# Patient Record
Sex: Female | Born: 1975 | Race: White | Hispanic: No | Marital: Married | Smoking: Never smoker
Health system: Southern US, Community
[De-identification: ages and names within clinical notes are randomized; demographics above are authoritative.]

## PROBLEM LIST (undated history)

## (undated) DIAGNOSIS — G4730 Sleep apnea, unspecified: Secondary | ICD-10-CM

## (undated) DIAGNOSIS — F32A Depression, unspecified: Secondary | ICD-10-CM

## (undated) DIAGNOSIS — M7989 Other specified soft tissue disorders: Secondary | ICD-10-CM

## (undated) DIAGNOSIS — F419 Anxiety disorder, unspecified: Secondary | ICD-10-CM

## (undated) DIAGNOSIS — K219 Gastro-esophageal reflux disease without esophagitis: Secondary | ICD-10-CM

## (undated) DIAGNOSIS — I10 Essential (primary) hypertension: Secondary | ICD-10-CM

## (undated) DIAGNOSIS — R011 Cardiac murmur, unspecified: Secondary | ICD-10-CM

## (undated) DIAGNOSIS — I639 Cerebral infarction, unspecified: Secondary | ICD-10-CM

## (undated) DIAGNOSIS — N200 Calculus of kidney: Secondary | ICD-10-CM

## (undated) DIAGNOSIS — E785 Hyperlipidemia, unspecified: Secondary | ICD-10-CM

## (undated) DIAGNOSIS — M549 Dorsalgia, unspecified: Secondary | ICD-10-CM

## (undated) DIAGNOSIS — J45909 Unspecified asthma, uncomplicated: Secondary | ICD-10-CM

## (undated) DIAGNOSIS — M2550 Pain in unspecified joint: Secondary | ICD-10-CM

## (undated) HISTORY — DX: Depression, unspecified: F32.A

## (undated) HISTORY — DX: Hyperlipidemia, unspecified: E78.5

## (undated) HISTORY — PX: CHOLECYSTECTOMY: SHX55

## (undated) HISTORY — DX: Anxiety disorder, unspecified: F41.9

## (undated) HISTORY — DX: Cardiac murmur, unspecified: R01.1

## (undated) HISTORY — DX: Unspecified asthma, uncomplicated: J45.909

## (undated) HISTORY — DX: Pain in unspecified joint: M25.50

## (undated) HISTORY — DX: Sleep apnea, unspecified: G47.30

## (undated) HISTORY — PX: ABDOMINAL HYSTERECTOMY: SHX81

## (undated) HISTORY — PX: TUBAL LIGATION: SHX77

## (undated) HISTORY — PX: KNEE ARTHROSCOPY: SUR90

## (undated) HISTORY — PX: OTHER SURGICAL HISTORY: SHX169

## (undated) HISTORY — DX: Other specified soft tissue disorders: M79.89

## (undated) HISTORY — DX: Gastro-esophageal reflux disease without esophagitis: K21.9

## (undated) HISTORY — DX: Cerebral infarction, unspecified: I63.9

## (undated) HISTORY — DX: Dorsalgia, unspecified: M54.9

---

## 2010-09-24 ENCOUNTER — Encounter (INDEPENDENT_AMBULATORY_CARE_PROVIDER_SITE_OTHER): Payer: Medicaid Other | Admitting: Obstetrics and Gynecology

## 2010-09-24 ENCOUNTER — Other Ambulatory Visit: Payer: Self-pay | Admitting: Obstetrics and Gynecology

## 2010-09-24 ENCOUNTER — Other Ambulatory Visit (HOSPITAL_COMMUNITY)
Admission: RE | Admit: 2010-09-24 | Discharge: 2010-09-24 | Disposition: A | Discharge status: Home / Self Care | Payer: Medicaid Other | Source: Ambulatory Visit | Attending: Obstetrics and Gynecology | Admitting: Obstetrics and Gynecology

## 2010-09-24 DIAGNOSIS — Z01419 Encounter for gynecological examination (general) (routine) without abnormal findings: Secondary | ICD-10-CM | POA: Insufficient documentation

## 2010-09-24 DIAGNOSIS — Z302 Encounter for sterilization: Secondary | ICD-10-CM

## 2010-09-24 DIAGNOSIS — R87810 Cervical high risk human papillomavirus (HPV) DNA test positive: Secondary | ICD-10-CM | POA: Insufficient documentation

## 2010-09-24 DIAGNOSIS — Z113 Encounter for screening for infections with a predominantly sexual mode of transmission: Secondary | ICD-10-CM | POA: Insufficient documentation

## 2010-09-25 NOTE — Assessment & Plan Note (Signed)
Mckenzie Park, Mckenzie Park             ACCOUNT NO.:  [id number]  MEDICAL RECORD NO.:  [id number]          PATIENT TYPE:  LOCATION:  [facility] [place]           FACILITY:  PHYSICIAN:  Catalina Antigua, MD          DATE OF BIRTH:  DATE OF SERVICE:  09/24/2010                                 CLINIC NOTE  This is a 34 year old G5, P4-0-1-4 with LMP of August 29, 2010, who presents today for annual exam as well as evaluation of a vaginal discharge.  The patient recently moved from [place] in November 2011, and reports having approximately 7 different sexual partner since that time.  The patient uses bilateral tubal ligation for contraception as well as condoms occasionally.  The patient reports that over the past 3 months, she has noted the presence of a vaginal discharge which is mainly milky gray in color and occasionally yellow tinged accompanied by a fishy odor and over the past few weeks, she started to experience some pruritus and burning sensation.  The patient is otherwise without any other complaints.  Denies abnormal bleeding or pelvic pain.  The patient is also interested in full STD testing today.  PAST MEDICAL HISTORY:  Significant for hypertension.  PAST SURGICAL HISTORY:  She has had a kidney stone removal and bilateral tubal ligation.  PAST OB HISTORY:  She has had 4 full-term vaginal deliveries and 1 miscarriage treated by D and C.  PAST GYN HISTORY:  She denies any cyst, fibroids, or history of abnormal Pap smears.  FAMILY HISTORY:  Significant for diabetes, hypertension, coronary artery disease, and a grandmother with pancreatic cancer.  SOCIAL HISTORY:  She denies drinking, smoking, or the use of illicit drugs.  REVIEW OF SYSTEMS:  Otherwise significant for occasional joint pain.  PHYSICAL EXAMINATION:  VITAL SIGNS:  Her blood pressure is 127/70, pulse of 55, weight of 200 pounds, height of 65 inches. LUNGS:  Clear to auscultation bilaterally. HEART:  Regular  rate and rhythm. BREASTS:  Nontender, equal in size.  No palpable lymphadenopathy.  No expressible nipple discharge.  No skin dimpling. ABDOMEN:  Soft, nontender, nondistended. PELVIC:  She had normal-appearing external genitalia.  Normal-appearing vaginal mucosa and cervix.  A thin milky white discharge was visualized. No odor was appreciated.  Cervix is easily friable.  Bimanual exam shows a small anteverted uterus.  No palpable adnexal masses or tenderness.  ASSESSMENT AND PLAN:  This is a 35 year old G5, P4 with LMP of August 29, 2010, presents today for annual exam and requesting sexually transmitted disease testing.  Pap smears and cultures were performed along with a wet prep.  The patient will be tested for human immunodeficiency virus, hepatitis B, C, and syphilis.  The patient will be contacted with any abnormal results.  The patient is otherwise to return in a year or p.r.n.  The patient was also advised to continue to religious use of condoms as to prevent sexually transmitted diseases transmission and the patient is to follow up with her primary care physician for the management of her hypertension.          ______________________________ Catalina Antigua, MD    PC/MEDQ  D:  09/24/2010  T:  09/25/2010  Job:  [id number] 

## 2010-11-13 ENCOUNTER — Ambulatory Visit: Payer: Medicaid Other | Admitting: Obstetrics & Gynecology

## 2010-11-20 ENCOUNTER — Ambulatory Visit (INDEPENDENT_AMBULATORY_CARE_PROVIDER_SITE_OTHER): Payer: Medicaid Other | Admitting: Obstetrics & Gynecology

## 2010-11-20 DIAGNOSIS — Z113 Encounter for screening for infections with a predominantly sexual mode of transmission: Secondary | ICD-10-CM

## 2015-07-04 ENCOUNTER — Encounter (HOSPITAL_COMMUNITY): Payer: Self-pay | Admitting: Emergency Medicine

## 2015-07-04 ENCOUNTER — Emergency Department (HOSPITAL_COMMUNITY)
Admission: EM | Admit: 2015-07-04 | Discharge: 2015-07-04 | Disposition: A | Discharge status: Home / Self Care | Payer: BLUE CROSS/BLUE SHIELD | Attending: Emergency Medicine | Admitting: Emergency Medicine

## 2015-07-04 DIAGNOSIS — Z87442 Personal history of urinary calculi: Secondary | ICD-10-CM | POA: Diagnosis not present

## 2015-07-04 DIAGNOSIS — H538 Other visual disturbances: Secondary | ICD-10-CM | POA: Diagnosis not present

## 2015-07-04 DIAGNOSIS — R112 Nausea with vomiting, unspecified: Secondary | ICD-10-CM | POA: Diagnosis not present

## 2015-07-04 DIAGNOSIS — R51 Headache: Secondary | ICD-10-CM | POA: Insufficient documentation

## 2015-07-04 DIAGNOSIS — I10 Essential (primary) hypertension: Secondary | ICD-10-CM | POA: Diagnosis not present

## 2015-07-04 DIAGNOSIS — R519 Headache, unspecified: Secondary | ICD-10-CM

## 2015-07-04 HISTORY — DX: Calculus of kidney: N20.0

## 2015-07-04 HISTORY — DX: Essential (primary) hypertension: I10

## 2015-07-04 MED ORDER — DIPHENHYDRAMINE HCL 50 MG/ML IJ SOLN
25.0000 mg | Freq: Once | INTRAMUSCULAR | Status: AC
  Administered 2015-07-04: 25 mg via INTRAVENOUS
  Filled 2015-07-04: qty 1

## 2015-07-04 MED ORDER — METHYLPREDNISOLONE SODIUM SUCC 125 MG IJ SOLR
125.0000 mg | Freq: Once | INTRAMUSCULAR | Status: AC
  Administered 2015-07-04: 125 mg via INTRAVENOUS
  Filled 2015-07-04: qty 2

## 2015-07-04 MED ORDER — PROCHLORPERAZINE EDISYLATE 5 MG/ML IJ SOLN
10.0000 mg | Freq: Once | INTRAMUSCULAR | Status: AC
  Administered 2015-07-04: 10 mg via INTRAVENOUS
  Filled 2015-07-04: qty 2

## 2015-07-04 MED ORDER — SODIUM CHLORIDE 0.9 % IV BOLUS (SEPSIS)
1000.0000 mL | Freq: Once | INTRAVENOUS | Status: AC
  Administered 2015-07-04: 1000 mL via INTRAVENOUS

## 2015-07-04 MED ORDER — KETOROLAC TROMETHAMINE 30 MG/ML IJ SOLN
30.0000 mg | Freq: Once | INTRAMUSCULAR | Status: AC
  Administered 2015-07-04: 30 mg via INTRAVENOUS
  Filled 2015-07-04: qty 1

## 2015-07-04 MED ORDER — MAGNESIUM SULFATE 2 GM/50ML IV SOLN
2.0000 g | Freq: Once | INTRAVENOUS | Status: AC
  Administered 2015-07-04: 2 g via INTRAVENOUS
  Filled 2015-07-04: qty 50

## 2015-07-04 NOTE — Discharge Instructions (Signed)
Please follow with your primary care doctor in the next 2 days for a check-up. They must obtain records for further management.   Do not hesitate to return to the Emergency Department for any new, worsening or concerning symptoms.    General Headache Without Cause A headache is pain or discomfort felt around the head or neck area. There are many causes and types of headaches. In some cases, the cause may not be found.  HOME CARE  Managing Pain  Take over-the-counter and prescription medicines only as told by your doctor.  Lie down in a dark, quiet room when you have a headache.  If directed, apply ice to the head and neck area:  Put ice in a plastic bag.  Place a towel between your skin and the bag.  Leave the ice on for 20 minutes, 2-3 times per day.  Use a heating pad or hot shower to apply heat to the head and neck area as told by your doctor.  Keep lights dim if bright lights bother you or make your headaches worse. Eating and Drinking  Eat meals on a regular schedule.  Lessen how much alcohol you drink.  Lessen how much caffeine you drink, or stop drinking caffeine. General Instructions  Keep all follow-up visits as told by your doctor. This is important.  Keep a journal to find out if certain things bring on headaches. For example, write down:  What you eat and drink.  How much sleep you get.  Any change to your diet or medicines.  Relax by getting a massage or doing other relaxing activities.  Lessen stress.  Sit up straight. Do not tighten (tense) your muscles.  Do not use tobacco products. This includes cigarettes, chewing tobacco, or e-cigarettes. If you need help quitting, ask your doctor.  Exercise regularly as told by your doctor.  Get enough sleep. This often means 7-9 hours of sleep. GET HELP IF:  Your symptoms are not helped by medicine.  You have a headache that feels different than the other headaches.  You feel sick to your stomach  (nauseous) or you throw up (vomit).  You have a fever. GET HELP RIGHT AWAY IF:   Your headache becomes really bad.  You keep throwing up.  You have a stiff neck.  You have trouble seeing.  You have trouble speaking.  You have pain in the eye or ear.  Your muscles are weak or you lose muscle control.  You lose your balance or have trouble walking.  You feel like you will pass out (faint) or you pass out.  You have confusion.   This information is not intended to replace advice given to you by your health care provider. Make sure you discuss any questions you have with your health care provider.   Document Released: 02/19/2008 Document Revised: 01/31/2015 Document Reviewed: 09/04/2014 Elsevier Interactive Patient Education [employer].

## 2015-07-04 NOTE — ED Provider Notes (Signed)
CSN: [id number]     Arrival date & time 07/04/15  0414 History   First MD Initiated Contact with Patient 07/04/15 0602     Chief Complaint  Patient presents with  . Migraine     (Consider location/radiation/quality/duration/timing/severity/associated sxs/prior Treatment) HPI   Blood pressure 128/96, pulse 67, temperature 98.5 F (36.9 C), temperature source Oral, resp. rate 18, height 5\' 5"  (1.651 m), weight 95.794 kg, SpO2 99 %.  Mckenzie Park is a 40 y.o. female complaining of migraine onset 3 days ago with bilateral frontal radiating back to the occipital area feels throbbing, 9 out of 10 associated with photophobia, phonophobia, nausea, vomiting, blurred visions is consistent with prior episodes of migraine. She recently moved to the area and does not have a neurologist. She's taken Imitrex in the past. She has a primary care gave her an unknown medication but she can only have 2 in a 24-hour period and this is not helping her. States that the frequency of migraines are increasing to approximately 15-16 per month. She denies fever, chills, cervicalgia, chest pain, shortness of breath, dysarthria, ataxia.   Past Medical History  Diagnosis Date  . Kidney stone   . Hypertension    Past Surgical History  Procedure Laterality Date  . Abdominal hysterectomy    . Cholecystectomy    . Tubal ligation     History reviewed. No pertinent family history. Social History  Substance Use Topics  . Smoking status: Never Smoker   . Smokeless tobacco: None  . Alcohol Use: No   OB History    No data available     Review of Systems  10 systems reviewed and found to be negative, except as noted in the HPI.  Allergies  Review of patient's allergies indicates no known allergies.  Home Medications   Prior to Admission medications   Not on File   BP 128/96 mmHg  Pulse 67  Temp(Src) 98.5 F (36.9 C) (Oral)  Resp 18  Ht 5\' 5"  (1.651 m)  Wt 95.794 kg  BMI 35.14 kg/m2  SpO2  99% Physical Exam  Constitutional: She is oriented to person, place, and time. She appears well-developed and well-nourished.  HENT:  Head: Normocephalic and atraumatic.  Mouth/Throat: Oropharynx is clear and moist.  Eyes: Conjunctivae and EOM are normal. Pupils are equal, round, and reactive to light.  No TTP of maxillary or frontal sinuses  No TTP or induration of temporal arteries bilaterally  Neck: Normal range of motion. Neck supple.  FROM to C-spine. Pt can touch chin to chest without discomfort. No TTP of midline cervical spine.   Cardiovascular: Normal rate, regular rhythm and intact distal pulses.   Pulmonary/Chest: Effort normal and breath sounds normal. No respiratory distress. She has no wheezes. She has no rales. She exhibits no tenderness.  Abdominal: Soft. Bowel sounds are normal. There is no tenderness.  Musculoskeletal: Normal range of motion. She exhibits no edema or tenderness.  Neurological: She is alert and oriented to person, place, and time. No cranial nerve deficit.  II-Visual fields grossly intact. III/IV/VI-Extraocular movements intact.  Pupils reactive bilaterally. V/VII-Smile symmetric, equal eyebrow raise,  facial sensation intact VIII- Hearing grossly intact IX/X-Normal gag XI-bilateral shoulder shrug XII-midline tongue extension Motor: 5/5 bilaterally with normal tone and bulk Cerebellar: Normal finger-to-nose  and normal heel-to-shin test.   Romberg negative Ambulates with a coordinated gait   Nursing note and vitals reviewed.   ED Course  Procedures (including critical care time) Labs Review Labs Reviewed - No  data to display  Imaging Review No results found. I have personally reviewed and evaluated these images and lab results as part of my medical decision-making.   EKG Interpretation None      MDM   Final diagnoses:  Nonintractable headache, unspecified chronicity pattern, unspecified headache type    Filed Vitals:   07/04/15  0418  BP: 128/96  Pulse: 67  Temp: 98.5 F (36.9 C)  TempSrc: Oral  Resp: 18  Height: 5\' 5"  (1.651 m)  Weight: 95.794 kg  SpO2: 99%    Medications  sodium chloride 0.9 % bolus 1,000 mL (not administered)  prochlorperazine (COMPAZINE) injection 10 mg (not administered)  magnesium sulfate IVPB 2 g 50 mL (not administered)  methylPREDNISolone sodium succinate (SOLU-MEDROL) 125 mg/2 mL injection 125 mg (not administered)  ketorolac (TORADOL) 30 MG/ML injection 30 mg (not administered)  diphenhydrAMINE (BENADRYL) injection 25 mg (not administered)    Mckenzie Park is 40 y.o. female presenting with headache typical for her migraine exacerbations. Patient recently moved to the area, she does have a primary care but has not establish neurologic care. Neuro exam nonfocal. Headache resolved with cocktail. HA. Presentation is like pts typical HA and non concerning for [facility], ICH, Meningitis, or temporal arteritis. Pt is afebrile with no focal neuro deficits, nuchal rigidity, or change in vision. Pt is to follow up with PCP to discuss prophylactic medication. Pt verbalizes understanding and is agreeable with plan to dc.  Evaluation does not show pathology that would require ongoing emergent intervention or inpatient treatment. Pt is hemodynamically stable and mentating appropriately. Discussed findings and plan with patient/guardian, who agrees with care plan. All questions answered. Return precautions discussed and outpatient follow up given.    Monico Blitz, PA-C 07/04/15 0805  Quintella Reichert, MD 07/05/15 [phone]

## 2015-07-04 NOTE — ED Notes (Signed)
Patient here with history of migraine for 3 days.  Patient has taken her meds for her migraine without relief.  Patient does have nausea and vomiting, photophobia and noise sensitivity.

## 2015-08-01 ENCOUNTER — Encounter: Payer: Self-pay | Admitting: Neurology

## 2015-08-01 ENCOUNTER — Ambulatory Visit (INDEPENDENT_AMBULATORY_CARE_PROVIDER_SITE_OTHER): Payer: BLUE CROSS/BLUE SHIELD | Admitting: Neurology

## 2015-08-01 VITALS — BP 148/80 | HR 54 | Ht 65.5 in | Wt 211.0 lb

## 2015-08-01 DIAGNOSIS — F329 Major depressive disorder, single episode, unspecified: Secondary | ICD-10-CM | POA: Diagnosis not present

## 2015-08-01 DIAGNOSIS — R413 Other amnesia: Secondary | ICD-10-CM | POA: Insufficient documentation

## 2015-08-01 DIAGNOSIS — F32A Depression, unspecified: Secondary | ICD-10-CM

## 2015-08-01 DIAGNOSIS — G43709 Chronic migraine without aura, not intractable, without status migrainosus: Secondary | ICD-10-CM | POA: Insufficient documentation

## 2015-08-01 DIAGNOSIS — I10 Essential (primary) hypertension: Secondary | ICD-10-CM

## 2015-08-01 MED ORDER — NORTRIPTYLINE HCL 25 MG PO CAPS: 25.0000 mg | ORAL_CAPSULE | Freq: Every day | ORAL | Status: DC

## 2015-08-01 MED ORDER — SUMATRIPTAN SUCCINATE 100 MG PO TABS: ORAL_TABLET | ORAL | Status: DC

## 2015-08-01 MED ORDER — NAPROXEN 500 MG PO TABS
500.0000 mg | ORAL_TABLET | Freq: Two times a day (BID) | ORAL | Status: DC | PRN
Start: 2015-08-01 — End: 2016-03-28

## 2015-08-01 NOTE — Progress Notes (Signed)
NEUROLOGY CONSULTATION NOTE  Quanetta Schlau MRN: [id number] DOB: Jan 26, 1976  Referring provider: Monico Blitz, PA-C (ED referral) Primary care provider: no PCP  Reason for consult:  migraine  HISTORY OF PRESENT ILLNESS: Mckenzie Park is a 40 year old right-handed female with hypertension and past history of kidney stones who presents for migraines.  History obtained by patient and ED note.    Onset:  Since her 66s Location:  Bifrontal or band-like Quality:  Vice-like/squeezing, pounding in back of head Intensity:  10/10 Aura:  no Prodrome:  no Associated symptoms:  Nausea, vertigo, photophobia, blurred vision, sometimes vomiting or phonophobia Duration:  All day or unless she goes to sleep Frequency:  Over 15 days per month Triggers/exacerbating factors:  none Relieving factors:  sleep Activity:  Able to force self to function  Past NSAIDS:  none Past analgesics:  Excedrin Migraine Past abortive triptans:  Sumatriptan tablet (effective) Past muscle relaxants:  Flexeril (for knee) Past anti-nausea:  no Past antihypertensive medications:  no Past antidepressant medications:  no Past anticonvulsant medications:  no Past vitamins/Herbal/Supplements:  no Past antihistamines/decongestants:  no Other past medications:  no  Current NSAIDS:  Advil (takes at work) Current analgesics:  Tylenol (takes at work) Current triptans:  Maxalt 10mg  (variable efficacy) Current anti-nausea:  Zofran ODT 4mg  Current muscle relaxants:  no Current Antihypertensive medications:  Hyzaar Current Antidepressant medications:  "low-dose" Cymbalta (does not take daily) Current Anticonvulsant medications:  no Current Vitamins/Herbal/Supplements:  no Current Antihistamines/Decongestants:  no Other therapy:  no  Caffeine:  Coffee every other day Alcohol:  no Smoker:  no Diet:  Recently started hydrating.  Stopped sugar Exercise:  no Depression/stress:  yes Sleep hygiene:  poor Family  history of headache:  No.  Father has Parkinson's disease  She has history of fluctuating bradycardia.  She has history of kidney stones 3 times (15-20 years ago)  PAST MEDICAL HISTORY: Past Medical History  Diagnosis Date  . Kidney stone   . Hypertension     PAST SURGICAL HISTORY: Past Surgical History  Procedure Laterality Date  . Abdominal hysterectomy    . Cholecystectomy    . Tubal ligation      MEDICATIONS: No current outpatient prescriptions on file prior to visit.   No current facility-administered medications on file prior to visit.    ALLERGIES: No Known Allergies  FAMILY HISTORY: Family History  Problem Relation Age of Onset  . Parkinsonism Father     SOCIAL HISTORY: Social History   Social History  . Marital Status: Widowed    Spouse Name: N/A  . Number of Children: N/A  . Years of Education: N/A   Occupational History  . Not on file.   Social History Main Topics  . Smoking status: Never Smoker   . Smokeless tobacco: Not on file  . Alcohol Use: No  . Drug Use: No  . Sexual Activity: Not on file   Other Topics Concern  . Not on file   Social History Narrative    REVIEW OF SYSTEMS: Constitutional: No fevers, chills, or sweats, no generalized fatigue, change in appetite Eyes: No visual changes, double vision, eye pain Ear, nose and throat: No hearing loss, ear pain, nasal congestion, sore throat Cardiovascular: No chest pain, palpitations Respiratory:  No shortness of breath at rest or with exertion, wheezes GastrointestinaI: No nausea, vomiting, diarrhea, abdominal pain, fecal incontinence Genitourinary:  No dysuria, urinary retention or frequency Musculoskeletal:  No neck pain, back pain Integumentary: No rash, pruritus, skin lesions Neurological:  as above Psychiatric: No depression, insomnia, anxiety Endocrine: No palpitations, fatigue, diaphoresis, mood swings, change in appetite, change in weight, increased  thirst Hematologic/Lymphatic:  No anemia, purpura, petechiae. Allergic/Immunologic: no itchy/runny eyes, nasal congestion, recent allergic reactions, rashes  PHYSICAL EXAM: Filed Vitals:   08/01/15 0850  BP: 148/80  Pulse: 54   General: No acute distress.  Patient appears well-groomed.  Head:  Normocephalic/atraumatic Eyes:  fundi unremarkable, without vessel changes, exudates, hemorrhages or papilledema. Neck: supple, no paraspinal tenderness, full range of motion Back: No paraspinal tenderness Heart: regular rate and rhythm Lungs: Clear to auscultation bilaterally. Vascular: No carotid bruits. Neurological Exam: Mental status: alert and oriented to person, place, and time, recent and remote memory intact, fund of knowledge intact, attention and concentration intact, speech fluent and not dysarthric, language intact. Cranial nerves: CN I: not tested CN II: pupils equal, round and reactive to light, visual fields intact, fundi unremarkable, without vessel changes, exudates, hemorrhages or papilledema. CN III, IV, VI:  full range of motion, no nystagmus, no ptosis CN V: facial sensation intact CN VII: upper and lower face symmetric CN VIII: hearing intact CN IX, X: gag intact, uvula midline CN XI: sternocleidomastoid and trapezius muscles intact CN XII: tongue midline Bulk & Tone: normal, no fasciculations. Motor:  5/5 throughout  Sensation:  Pinprick and vibration sensation intact. Deep Tendon Reflexes:  2+ throughout, toes downgoing.  Finger to nose testing:  Without dysmetria.  Heel to shin:  Without dysmetria.  Gait:  Normal station and stride.  Able to turn and tandem walk. Romberg negative.  IMPRESSION: Chronic migraine without aura Depression HTN  I would steer away from beta blockers (history of episodic bradycardia) and topamax (kidney stones)  PLAN: 1.  Stop Cymbalta.  Start nortriptyline 25mg  at bedtime 2.  Stop Maxalt.  Retry sumatriptan with/without naproxen  500mg   3.  Lifestyle modification (exercise, diet, sleep hygiene, depression treatment) 4.  Follow up with new PCP or urgent care regarding BP 5.  Contact us with update in 4 weeks.  Follow up in 3 to 4 months.  45 minutes spent face to face with patient, over 50% spent discussing management.  Thank you for allowing me to take part in the care of this patient.  Metta Clines, DO

## 2015-08-01 NOTE — Patient Instructions (Addendum)
Migraine Recommendations: 1.  Stop Cymbalta.  Start nortriptyline 50mg  at bedtime.  Call in 4 weeks with update and we can adjust dose if needed. 2.  Stop rizatriptan.  Take sumatriptan 100mg  at earliest onset of headache.  May repeat dose once in 2 hours if needed.  Do not exceed two tablets in 24 hours.  May take each dose with naproxen 500mg .  Take Zofran for nausea 3.  Limit use of pain relievers to no more than 2 days out of the week.  These medications include acetaminophen, ibuprofen, triptans and narcotics.  This will help reduce risk of rebound headaches. 4.  Be aware of common food triggers such as processed sweets, processed foods with nitrites (such as deli meat, hot dogs, sausages), foods with MSG, alcohol (such as wine), chocolate, certain cheeses, certain fruits (dried fruits, some citrus fruit), vinegar, diet soda. 4.  Avoid caffeine 5.  Routine exercise 6.  Proper sleep hygiene 7.  Stay adequately hydrated with water 8.  Keep a headache diary. 9.  Maintain proper stress management. 10.  Do not skip meals. 11.  Consider supplements:  Magnesium oxide 400mg  to 600mg  daily, riboflavin 400mg , Coenzyme Q 10 100mg  three times daily 12.  Follow up

## 2015-08-31 ENCOUNTER — Telehealth: Payer: Self-pay

## 2015-08-31 DIAGNOSIS — I10 Essential (primary) hypertension: Secondary | ICD-10-CM

## 2015-08-31 DIAGNOSIS — G43709 Chronic migraine without aura, not intractable, without status migrainosus: Secondary | ICD-10-CM

## 2015-08-31 DIAGNOSIS — F32A Depression, unspecified: Secondary | ICD-10-CM

## 2015-08-31 DIAGNOSIS — F329 Major depressive disorder, single episode, unspecified: Secondary | ICD-10-CM

## 2015-08-31 MED ORDER — SUMATRIPTAN SUCCINATE 100 MG PO TABS: ORAL_TABLET | ORAL | Status: DC

## 2015-08-31 MED ORDER — NORTRIPTYLINE HCL 50 MG PO CAPS: 50.0000 mg | ORAL_CAPSULE | Freq: Every day | ORAL | Status: DC

## 2015-08-31 NOTE — Telephone Encounter (Signed)
Pt called with 4 week update. Pt has had 7 migraines this month. Last month she had ~10.  She is not having side effects from medication. Please advise.   [phone]

## 2015-08-31 NOTE — Telephone Encounter (Signed)
I would like to increase nortriptyline from 25mg  to 50mg  at bedtime and she should contact us again with update in 4 weeks.

## 2015-08-31 NOTE — Telephone Encounter (Signed)
Message relayed to patient. Verbalized understanding and denied questions.   

## 2015-09-26 ENCOUNTER — Other Ambulatory Visit: Payer: Self-pay

## 2015-09-26 DIAGNOSIS — Z1231 Encounter for screening mammogram for malignant neoplasm of breast: Secondary | ICD-10-CM

## 2015-10-10 ENCOUNTER — Ambulatory Visit
Admission: RE | Admit: 2015-10-10 | Discharge: 2015-10-10 | Disposition: A | Discharge status: Home / Self Care | Payer: BLUE CROSS/BLUE SHIELD | Source: Ambulatory Visit

## 2015-10-10 DIAGNOSIS — Z1231 Encounter for screening mammogram for malignant neoplasm of breast: Secondary | ICD-10-CM

## 2015-11-12 ENCOUNTER — Other Ambulatory Visit: Payer: Self-pay | Admitting: Sports Medicine

## 2015-11-12 DIAGNOSIS — M25531 Pain in right wrist: Secondary | ICD-10-CM

## 2015-11-15 ENCOUNTER — Encounter: Payer: Self-pay | Admitting: Neurology

## 2015-11-15 ENCOUNTER — Ambulatory Visit (INDEPENDENT_AMBULATORY_CARE_PROVIDER_SITE_OTHER): Payer: BLUE CROSS/BLUE SHIELD | Admitting: Neurology

## 2015-11-15 VITALS — BP 130/82 | HR 61 | Ht 65.5 in | Wt 224.0 lb

## 2015-11-15 DIAGNOSIS — R030 Elevated blood-pressure reading, without diagnosis of hypertension: Secondary | ICD-10-CM

## 2015-11-15 DIAGNOSIS — Z658 Other specified problems related to psychosocial circumstances: Secondary | ICD-10-CM | POA: Diagnosis not present

## 2015-11-15 DIAGNOSIS — F439 Reaction to severe stress, unspecified: Secondary | ICD-10-CM

## 2015-11-15 DIAGNOSIS — G43709 Chronic migraine without aura, not intractable, without status migrainosus: Secondary | ICD-10-CM | POA: Diagnosis not present

## 2015-11-15 DIAGNOSIS — IMO0001 Reserved for inherently not codable concepts without codable children: Secondary | ICD-10-CM

## 2015-11-15 MED ORDER — NORTRIPTYLINE HCL 25 MG PO CAPS: 75.0000 mg | ORAL_CAPSULE | Freq: Every day | ORAL | Status: DC

## 2015-11-15 MED ORDER — ZOLMITRIPTAN 5 MG PO TABS: ORAL_TABLET | ORAL | Status: DC

## 2015-11-15 NOTE — Progress Notes (Signed)
NEUROLOGY FOLLOW UP OFFICE NOTE  Lynna Carlsson [id number]  HISTORY OF PRESENT ILLNESS: Veera Dockendorf is a 40 year old right-handed female with hypertension and past history of kidney stones who follows up for migraines.  UPDATE: Migraine frequency has reduced frequency, but she gets a dull head pressure every other day.  When she is at work, she takes an ibuprofen or Tylenol, because sumatriptan makes her too drowsy to function.  This is ineffective, so she has a headache until she can go home and sleep.  Intensity:  10/10 Duration:  She goes to sleep after taking sumatriptan because it makes her drowsy Frequency:  2 to 3 days per month, but has dull headache every other day. Current NSAIDS:  naproxen 500mg , Advil (takes at work) Current analgesics:  Tylenol (takes at work) Current triptans:  sumatrptan 100mg  Current anti-nausea:  Zofran ODT 4mg  Current muscle relaxants:  no Current Antihypertensive medications:  Hyzaar Current Antidepressant medications:  nortriptyline 50mg  Current Anticonvulsant medications:  no Current Vitamins/Herbal/Supplements:  no Current Antihistamines/Decongestants:  no Other therapy:  no  Caffeine:  1 cup every other day Alcohol:  no Smoker:  no Diet:  Hydrates.  Stopped sugar and salt Exercise:  Not until she is cleared by workman's comp for her hand injury Depression/stress:  Yes.  She is caring for two sick parents.  She is working 2 jobs. Sleep hygiene:  Poor, although a little better since moving from 3rd shift to 2nd shift.  HISTORY: Onset:  Since her 26s Location:  Bifrontal or band-like Quality:  Vice-like/squeezing, pounding in back of head Initial Intensity:  10/10 Aura:  no Prodrome:  no Associated symptoms:  Nausea, vertigo, photophobia, blurred vision, sometimes vomiting or phonophobia Initial Duration:  All day or unless she goes to sleep Initial Frequency:  Over 15 days per month Triggers/exacerbating factors:   none Relieving factors:  sleep Activity:  Able to force self to function  Past NSAIDS:  naproxen 500mg  (ineffective) Past analgesics:  Excedrin Migraine Past abortive triptans:  Maxalt  Past muscle relaxants:  Flexeril (for knee) Past anti-nausea:  no Past antihypertensive medications:  no Past antidepressant medications:  Cymbalta Past anticonvulsant medications:  no Past vitamins/Herbal/Supplements:  no Past antihistamines/decongestants:  no Other past medications:  no  Family history of headache:  No.  Father has Parkinson's disease  She has history of fluctuating bradycardia.  She has history of kidney stones 3 times (15-20 years ago)  PAST MEDICAL HISTORY: Past Medical History  Diagnosis Date  . Kidney stone   . Hypertension     MEDICATIONS: Current Outpatient Prescriptions on File Prior to Visit  Medication Sig Dispense Refill  . losartan-hydrochlorothiazide (HYZAAR) 50-12.5 MG tablet Take 1 tablet by mouth daily.    . SUMAtriptan (IMITREX) 100 MG tablet Take 1 tab at earliest onset of headache.  May repeat x1 in 2 hours if headache persists or recurs.  Do not exceed 2 tablets in 24 hours 10 tablet 2  . naproxen (NAPROSYN) 500 MG tablet Take 1 tablet (500 mg total) by mouth every 12 (twelve) hours as needed. (Patient not taking: Reported on 11/15/2015) 16 tablet 2   No current facility-administered medications on file prior to visit.    ALLERGIES: No Known Allergies  FAMILY HISTORY: Family History  Problem Relation Age of Onset  . Parkinsonism Father     SOCIAL HISTORY: Social History   Social History  . Marital Status: Widowed    Spouse Name: N/A  . Number of Children: N/A  .  Years of Education: N/A   Occupational History  . Not on file.   Social History Main Topics  . Smoking status: Never Smoker   . Smokeless tobacco: Not on file  . Alcohol Use: No  . Drug Use: No  . Sexual Activity: Not on file   Other Topics Concern  . Not on file   Social  History Narrative    REVIEW OF SYSTEMS: Constitutional: No fevers, chills, or sweats, no generalized fatigue, change in appetite Eyes: No visual changes, double vision, eye pain Ear, nose and throat: No hearing loss, ear pain, nasal congestion, sore throat Cardiovascular: No chest pain, palpitations Respiratory:  No shortness of breath at rest or with exertion, wheezes GastrointestinaI: No nausea, vomiting, diarrhea, abdominal pain, fecal incontinence Genitourinary:  No dysuria, urinary retention or frequency Musculoskeletal:  No neck pain, back pain Integumentary: No rash, pruritus, skin lesions Neurological: as above Psychiatric: No depression, insomnia, anxiety Endocrine: No palpitations, fatigue, diaphoresis, mood swings, change in appetite, change in weight, increased thirst Hematologic/Lymphatic:  No purpura, petechiae. Allergic/Immunologic: no itchy/runny eyes, nasal congestion, recent allergic reactions, rashes  PHYSICAL EXAM: Filed Vitals:   11/15/15 0733  BP: 130/82  Pulse: 61   General: No acute distress.  Patient appears well-groomed.  Head:  Normocephalic/atraumatic Eyes:  Fundi examined but not visualized Neck: supple, no paraspinal tenderness, full range of motion Heart:  Regular rate and rhythm Lungs:  Clear to auscultation bilaterally Back: No paraspinal tenderness Neurological Exam: alert and oriented to person, place, and time. Attention span and concentration intact, recent and remote memory intact, fund of knowledge intact.  Speech fluent and not dysarthric, language intact.  CN II-XII intact. Bulk and tone normal, unable to test strength in right hand due to injury.  Otherwise muscle strength 5/5 throughout.  Sensation to light touch, temperature and vibration intact.  Deep tendon reflexes 2+ throughout.  Finger to nose and heel to shin testing intact.  Gait normal, Romberg negative.  IMPRESSION: Migraine without aura Stress  PLAN: 1.  We will switch  abortive medication to Zomig 5mg  to see if it is still effective but does not cause the drowsiness.  Ideally, I would want her to take the triptan at earliest onset of headache.  If ineffective, then I would ask her to take 1/2 of the sumatriptan to see if it is still effective but not cause drowsiness. 2.  She may have underlying tension type headaches.  I would like to increase nortriptyline to 75mg  at bedtime to see how she does. 3.  Refer to Behavioral Health to address stress, which is affecting her sleep and contributing to headaches. 4.  Blood pressure elevated today.  Should follow up with PCP at [facility]. 5.  Follow up in 3 to 4 months.  15 minutes spent face to face with patient, over 50% spent counseling.  Metta Clines, DO

## 2015-11-15 NOTE — Patient Instructions (Addendum)
1.  We will increase nortriptyline to 75mg  at bedtime 2.  Instead of sumatriptan, try taking zolmitriptan 5mg  at earliest onset of headache.  You may repeat dose once in 2 hours if needed (not exceeding 2 tablets in 24 hours).  If this does not appear effective and/or still causes drowsiness, then next time you have a migraine, take 1/2 of a sumatriptan instead and hopefully it will be effective and not cause drowsiness.  You may repeat dose every two hours for total of 4 doses. 3.  We will refer you to Behavioral Medicine to help address stress, which is a trigger to headaches. 4.  Follow up in 3-4 months.

## 2015-11-17 ENCOUNTER — Ambulatory Visit
Admission: RE | Admit: 2015-11-17 | Discharge: 2015-11-17 | Disposition: A | Discharge status: Home / Self Care | Payer: Worker's Compensation | Source: Ambulatory Visit | Attending: Sports Medicine | Admitting: Sports Medicine

## 2015-11-17 DIAGNOSIS — M25531 Pain in right wrist: Secondary | ICD-10-CM

## 2016-02-15 ENCOUNTER — Ambulatory Visit: Payer: BLUE CROSS/BLUE SHIELD | Admitting: Neurology

## 2016-03-28 ENCOUNTER — Ambulatory Visit (INDEPENDENT_AMBULATORY_CARE_PROVIDER_SITE_OTHER): Payer: BLUE CROSS/BLUE SHIELD | Admitting: Neurology

## 2016-03-28 ENCOUNTER — Encounter: Payer: Self-pay | Admitting: Neurology

## 2016-03-28 VITALS — BP 110/66 | HR 68 | Ht 65.5 in | Wt 228.0 lb

## 2016-03-28 DIAGNOSIS — G43709 Chronic migraine without aura, not intractable, without status migrainosus: Secondary | ICD-10-CM

## 2016-03-28 DIAGNOSIS — I10 Essential (primary) hypertension: Secondary | ICD-10-CM | POA: Diagnosis not present

## 2016-03-28 MED ORDER — SUMATRIPTAN SUCCINATE 100 MG PO TABS: ORAL_TABLET | ORAL | 4 refills | Status: DC

## 2016-03-28 MED ORDER — NORTRIPTYLINE HCL 50 MG PO CAPS: 100.0000 mg | ORAL_CAPSULE | Freq: Every day | ORAL | 4 refills | Status: DC

## 2016-03-28 MED ORDER — ONDANSETRON 4 MG PO TBDP: 4.0000 mg | ORAL_TABLET | Freq: Three times a day (TID) | ORAL | 4 refills | Status: DC | PRN

## 2016-03-28 NOTE — Patient Instructions (Signed)
1.  We will increase nortriptyline to 100mg  at bedtime 2.  Continue sumatriptan as needed 3.  Take Zofran ODT 4mg  for nausea 4.  Follow up in 5 months.

## 2016-03-28 NOTE — Progress Notes (Signed)
NEUROLOGY FOLLOW UP OFFICE NOTE  Laural Gearty [id number]  HISTORY OF PRESENT ILLNESS: Mckenzie Park is a 40 year old right-handed female with hypertension and past history of kidney stones who follows up for migraines.   UPDATE: Improved.  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  Intensity:  10/10 Duration:  She goes to sleep after taking sumatriptan because it makes her drowsy Frequency:  Last month 6-7 migraine days.  Total of 20 headache days in a month counting dull headache. Current NSAIDS:  no.  Current analgesics:  Tylenol (takes at work). Takes ASA 81mg  Current triptans:  Sumatriptan 50mg  (never tried Zomig 5mg  NS) Current anti-nausea:  Zofran ODT 4mg  Current muscle relaxants:  no Current Antihypertensive medications:  Hyzaar Current Antidepressant medications:  nortriptyline 75mg  Current Anticonvulsant medications:  no Current Vitamins/Herbal/Supplements:  no Current Antihistamines/Decongestants:  no Other therapy:  no   Caffeine:  1 cup every other day Alcohol:  no Smoker:  no Diet:  Hydrates.  Stopped sugar and salt Exercise:  Not until she is cleared by workman's comp for her hand injury Depression/stress:  Yes.  She is caring for two sick parents.  She is working 2 jobs. Sleep hygiene:  Some improvement.  Switched to first shift.   HISTORY: Onset:  Since her 60s Location:  Bifrontal or band-like Quality:  Vice-like/squeezing, pounding in back of head Initial Intensity:  10/10 Aura:  no Prodrome:  no Associated symptoms:  Nausea, vertigo, photophobia, blurred vision, sometimes vomiting or phonophobia Initial Duration:  All day or unless she goes to sleep Initial Frequency:  Over 15 days per month Triggers/exacerbating factors:  none Relieving factors:  sleep Activity:  Able to force self to function   Past NSAIDS:  naproxen 500mg  (ineffective) Past analgesics:  Excedrin Migraine Past abortive  triptans:  Maxalt, sumatriptan 100mg  Past muscle relaxants:  Flexeril (for knee) Past anti-nausea:  no Past antihypertensive medications:  no Past antidepressant medications:  Cymbalta Past anticonvulsant medications:  no Past vitamins/Herbal/Supplements:  no Past antihistamines/decongestants:  no Other past medications:  no   Family history of headache:  No.  Father has Parkinson's disease   She has history of fluctuating bradycardia.  She has history of kidney stones 3 times (15-20 years ago)  PAST MEDICAL HISTORY: Past Medical History:  Diagnosis Date  . Hypertension   . Kidney stone     MEDICATIONS: Current Outpatient Prescriptions on File Prior to Visit  Medication Sig Dispense Refill  . aspirin (ASPIRIN EC) 81 MG EC tablet Take 81 mg by mouth daily. Swallow whole.    . losartan-hydrochlorothiazide (HYZAAR) 50-12.5 MG tablet Take 1 tablet by mouth daily.     No current facility-administered medications on file prior to visit.     ALLERGIES: No Known Allergies  FAMILY HISTORY: Family History  Problem Relation Age of Onset  . Parkinsonism Father     SOCIAL HISTORY: Social History   Social History  . Marital status: Widowed    Spouse name: N/A  . Number of children: N/A  . Years of education: N/A   Occupational History  . Not on file.   Social History Main Topics  . Smoking status: Never Smoker  . Smokeless tobacco: Not on file  . Alcohol use No  . Drug use: No  . Sexual activity: Not on file   Other Topics Concern  . Not on file   Social History Narrative  . No narrative on file  REVIEW OF SYSTEMS: Constitutional: No fevers, chills, or sweats, no generalized fatigue, change in appetite Eyes: No visual changes, double vision, eye pain Ear, nose and throat: No hearing loss, ear pain, nasal congestion, sore throat Cardiovascular: No chest pain, palpitations Respiratory:  No shortness of breath at rest or with exertion, wheezes GastrointestinaI:  No nausea, vomiting, diarrhea, abdominal pain, fecal incontinence Genitourinary:  No dysuria, urinary retention or frequency Musculoskeletal:  No neck pain, back pain Integumentary: No rash, pruritus, skin lesions Neurological: as above Psychiatric: No depression, insomnia, anxiety Endocrine: No palpitations, fatigue, diaphoresis, mood swings, change in appetite, change in weight, increased thirst Hematologic/Lymphatic:  No purpura, petechiae. Allergic/Immunologic: no itchy/runny eyes, nasal congestion, recent allergic reactions, rashes  PHYSICAL EXAM: Vitals:   03/28/16 0721  BP: 110/66  Pulse: 68   General: No acute distress.  Patient appears well-groomed.  Head:  Normocephalic/atraumatic Eyes:  Fundi examined but not visualized Neck: supple, no paraspinal tenderness, full range of motion Heart:  Regular rate and rhythm Lungs:  Clear to auscultation bilaterally Back: No paraspinal tenderness Neurological Exam: alert and oriented to person, place, and time. Attention span and concentration intact, recent and remote memory intact, fund of knowledge intact.  Speech fluent and not dysarthric, language intact.  CN II-XII intact. Bulk and tone normal, muscle strength 5/5 throughout.  Sensation to light touch  intact.  Deep tendon reflexes 2+ throughout.  Finger to nose testing intact.  Gait normal  IMPRESSION: Migraine  PLAN: 1.  Will try increasing nortriptyline to 100mg  at bedtime in order to futher reduce frequency of headaches 2.  Sumatriptan 50mg  and Zofran ODT 4mg  for acute migraine and nausea 3.  Follow up in 5 months.  15 minutes spent face to face with patient, 50% spent counseling.  Metta Clines, DO  CC:  [facility]

## 2016-03-28 NOTE — Progress Notes (Signed)
Chart forwarded.  

## 2016-09-02 ENCOUNTER — Encounter: Payer: Self-pay | Admitting: Neurology

## 2016-09-02 ENCOUNTER — Ambulatory Visit (INDEPENDENT_AMBULATORY_CARE_PROVIDER_SITE_OTHER): Payer: BLUE CROSS/BLUE SHIELD | Admitting: Neurology

## 2016-09-02 VITALS — BP 136/74 | HR 56 | Ht 65.5 in | Wt 226.1 lb

## 2016-09-02 DIAGNOSIS — G43709 Chronic migraine without aura, not intractable, without status migrainosus: Secondary | ICD-10-CM

## 2016-09-02 DIAGNOSIS — Z79899 Other long term (current) drug therapy: Secondary | ICD-10-CM

## 2016-09-02 MED ORDER — TOPIRAMATE 50 MG PO TABS: 50.0000 mg | ORAL_TABLET | Freq: Every day | ORAL | 2 refills | Status: DC

## 2016-09-02 MED ORDER — KETOROLAC TROMETHAMINE 60 MG/2ML IM SOLN
60.0000 mg | Freq: Once | INTRAMUSCULAR | Status: AC
  Administered 2016-09-02: 60 mg via INTRAMUSCULAR

## 2016-09-02 MED ORDER — ELETRIPTAN HYDROBROMIDE 20 MG PO TABS: ORAL_TABLET | ORAL | 0 refills | Status: DC

## 2016-09-02 NOTE — Patient Instructions (Signed)
1.  Start topiramate 50mg  at bedtime.  Make sure to drink plenty of water during the day as there is a rare side effect of causing kidney stones.  Contact me in 4 weeks with update and we can increase dose if needed. 2.  At work, try Relpax (eletriptan) 20mg  earliest onset of headache.  May repeat once in 2 hours if needed.  Do not exceed 2 tablets in 24 hours.  Use Zofran for nausea. 3.  Will give you Toradol 60mg  injection today. 4.  Follow up in 3 months.

## 2016-09-02 NOTE — Progress Notes (Signed)
NEUROLOGY FOLLOW UP OFFICE NOTE  Mckenzie Park [id number]  HISTORY OF PRESENT ILLNESS: Mckenzie Park is a 41 year old right-handed female with hypertension and past history of kidney stones who follows up for migraines.   UPDATE: She has a migraine which lasted all day at work.  She cannot take sumatriptan due to drowsiness.  She would like an alternative. Intensity:  10/10 Duration:  She goes to sleep after taking sumatriptan because it makes her drowsy Frequency:  11 days over past month Current NSAIDS:  no.  Current analgesics:  Tylenol or BC (takes at work). Takes ASA 81mg  Current triptans:  Sumatriptan 50mg  (effective but causes too much drowsiness). Current anti-nausea:  Zofran ODT 4mg  Current muscle relaxants:  no Current Antihypertensive medications:  Hyzaar Current Antidepressant medications:  nortriptyline 100mg  Current Anticonvulsant medications:  no Current Vitamins/Herbal/Supplements:  no Current Antihistamines/Decongestants:  no Other therapy:  no   Caffeine:  1 cup every other day Alcohol:  no Smoker:  no Diet:  Hydrates.  Stopped sugar and salt Exercise:  Not until she is cleared by workman's comp for her hand injury Depression/stress:  Yes.  She is caring for two sick parents.  She is working 2 jobs. Sleep hygiene:  Some improvement.  Switched to first shift.   HISTORY: Onset:  Since her 24s Location:  Bifrontal or band-like Quality:  Vice-like/squeezing, pounding in back of head Initial Intensity:  10/10; November: 10/10 Aura:  no Prodrome:  no Associated symptoms:  Nausea, vertigo, photophobia, blurred vision, sometimes vomiting or phonophobia Initial Duration:  All day or unless she goes to sleep; November: goes right to sleep Initial Frequency:  Over 15 days per month; November: 20 headache days (6-7 days are migraine) Triggers/exacerbating factors:  none Relieving factors:  sleep Activity:  Able to force self to function   Past NSAIDS:   naproxen 500mg  (ineffective) Past analgesics:  Excedrin Migraine Past abortive triptans:  Maxalt, sumatriptan 100mg  Past muscle relaxants:  Flexeril (for knee) Past anti-nausea:  no Past antihypertensive medications:  no Past antidepressant medications:  Cymbalta Past anticonvulsant medications:  no Past vitamins/Herbal/Supplements:  no Past antihistamines/decongestants:  no Other past medications:  no   Family history of headache:  No.  Father has Parkinson's disease   She has history of fluctuating bradycardia.  She has history of kidney stones 3 times (15-20 years ago)  PAST MEDICAL HISTORY: Past Medical History:  Diagnosis Date  . Hypertension   . Kidney stone     MEDICATIONS: Current Outpatient Prescriptions on File Prior to Visit  Medication Sig Dispense Refill  . aspirin (ASPIRIN EC) 81 MG EC tablet Take 81 mg by mouth daily. Swallow whole.    . losartan-hydrochlorothiazide (HYZAAR) 50-12.5 MG tablet Take 1 tablet by mouth daily.    . nortriptyline (PAMELOR) 50 MG capsule Take 2 capsules (100 mg total) by mouth at bedtime. 60 capsule 4  . ondansetron (ZOFRAN-ODT) 4 MG disintegrating tablet Take 1 tablet (4 mg total) by mouth every 8 (eight) hours as needed for nausea or vomiting. Reported on 11/15/2015 20 tablet 4  . SUMAtriptan (IMITREX) 100 MG tablet Take 1 tab at earliest onset of headache.  May repeat x1 in 2 hours if headache persists or recurs.  Do not exceed 2 tablets in 24 hours 10 tablet 4   No current facility-administered medications on file prior to visit.     ALLERGIES: No Known Allergies  FAMILY HISTORY: Family History  Problem Relation Age of Onset  . Parkinsonism Father  SOCIAL HISTORY: Social History   Social History  . Marital status: Widowed    Spouse name: N/A  . Number of children: N/A  . Years of education: N/A   Occupational History  . Not on file.   Social History Main Topics  . Smoking status: Never Smoker  . Smokeless  tobacco: Never Used  . Alcohol use No  . Drug use: No  . Sexual activity: Not on file   Other Topics Concern  . Not on file   Social History Narrative  . No narrative on file    REVIEW OF SYSTEMS: Constitutional: No fevers, chills, or sweats, no generalized fatigue, change in appetite Eyes: No visual changes, double vision, eye pain Ear, nose and throat: No hearing loss, ear pain, nasal congestion, sore throat Cardiovascular: No chest pain, palpitations Respiratory:  No shortness of breath at rest or with exertion, wheezes GastrointestinaI: No nausea, vomiting, diarrhea, abdominal pain, fecal incontinence Genitourinary:  No dysuria, urinary retention or frequency Musculoskeletal:  No neck pain, back pain Integumentary: No rash, pruritus, skin lesions Neurological: as above Psychiatric: No depression, insomnia, anxiety Endocrine: No palpitations, fatigue, diaphoresis, mood swings, change in appetite, change in weight, increased thirst Hematologic/Lymphatic:  No purpura, petechiae. Allergic/Immunologic: no itchy/runny eyes, nasal congestion, recent allergic reactions, rashes  PHYSICAL EXAM: Vitals:   09/02/16 1434  BP: 136/74  Pulse: (!) 56   General: No acute distress.  Patient appears well-groomed.   Head:  Normocephalic/atraumatic Eyes:  Fundi examined but not visualized Neck: supple, no paraspinal tenderness, full range of motion Heart:  Regular rate and rhythm Lungs:  Clear to auscultation bilaterally Back: No paraspinal tenderness Neurological Exam: alert and oriented to person, place, and time. Attention span and concentration intact, recent and remote memory intact, fund of knowledge intact.  Speech fluent and not dysarthric, language intact.  CN II-XII intact. Bulk and tone normal, muscle strength 5/5 throughout.  Sensation to light touch  intact.  Deep tendon reflexes 2+ throughout.  Finger to nose testing intact.  Gait normal, Romberg  negative.  IMPRESSION: Migraines  PLAN: 1.  She will continue nortriptyline 100mg  at bedtime for now. 2.  I will add a second preventative.  I cannot start a beta blocker due to bradycardia.  After discussion, we decided to start topiramate 50mg  at bedtime.  She has had kidney stones three times in the past, but that was 15 to 20 years ago.  I advised her to drink plenty of water during the day.  We will check baseline BMP. 3.  Ideally, I would like her to try [employer] at work, but it typically is expensive.  We will try a different triptan and hope it doesn't cause too much drowsiness.  We will try Relpax 20mg .  She will continue Zofran for nausea. 4.  Will give her Toradol 60mg  injection today. 5.  Follow up in 3 months.  Contact us in 4 weeks with update.  Metta Clines, DO  CC:  [facility]

## 2016-09-08 ENCOUNTER — Emergency Department (HOSPITAL_COMMUNITY)
Admission: EM | Admit: 2016-09-08 | Discharge: 2016-09-08 | Disposition: A | Discharge status: Home / Self Care | Payer: BLUE CROSS/BLUE SHIELD | Attending: Emergency Medicine | Admitting: Emergency Medicine

## 2016-09-08 ENCOUNTER — Encounter (HOSPITAL_COMMUNITY): Payer: Self-pay | Admitting: Emergency Medicine

## 2016-09-08 DIAGNOSIS — R202 Paresthesia of skin: Secondary | ICD-10-CM | POA: Diagnosis not present

## 2016-09-08 DIAGNOSIS — Z7982 Long term (current) use of aspirin: Secondary | ICD-10-CM | POA: Insufficient documentation

## 2016-09-08 DIAGNOSIS — I10 Essential (primary) hypertension: Secondary | ICD-10-CM | POA: Diagnosis not present

## 2016-09-08 DIAGNOSIS — Z79899 Other long term (current) drug therapy: Secondary | ICD-10-CM | POA: Diagnosis not present

## 2016-09-08 DIAGNOSIS — R200 Anesthesia of skin: Secondary | ICD-10-CM | POA: Diagnosis present

## 2016-09-08 LAB — CBC WITH DIFFERENTIAL/PLATELET
BASOS ABS: 0.1 10*3/uL (ref 0.0–0.1)
Basophils Relative: 1 %
Eosinophils Absolute: 0.2 10*3/uL (ref 0.0–0.7)
Eosinophils Relative: 2 %
HCT: 38.8 % (ref 36.0–46.0)
Hemoglobin: 13.4 g/dL (ref 12.0–15.0)
LYMPHS ABS: 2.1 10*3/uL (ref 0.7–4.0)
Lymphocytes Relative: 25 %
MCH: 29.1 pg (ref 26.0–34.0)
MCHC: 34.5 g/dL (ref 30.0–36.0)
MCV: 84.2 fL (ref 78.0–100.0)
Monocytes Absolute: 0.7 10*3/uL (ref 0.1–1.0)
Monocytes Relative: 8 %
Neutro Abs: 5.4 10*3/uL (ref 1.7–7.7)
Neutrophils Relative %: 64 %
PLATELETS: 293 10*3/uL (ref 150–400)
RBC: 4.61 MIL/uL (ref 3.87–5.11)
RDW: 14.2 % (ref 11.5–15.5)
WBC: 8.3 10*3/uL (ref 4.0–10.5)

## 2016-09-08 LAB — URINALYSIS, ROUTINE W REFLEX MICROSCOPIC
Bilirubin Urine: NEGATIVE
GLUCOSE, UA: NEGATIVE mg/dL
HGB URINE DIPSTICK: NEGATIVE
KETONES UR: NEGATIVE mg/dL
LEUKOCYTES UA: NEGATIVE
Nitrite: NEGATIVE
PROTEIN: NEGATIVE mg/dL
Specific Gravity, Urine: 1.009 (ref 1.005–1.030)
pH: 7 (ref 5.0–8.0)

## 2016-09-08 LAB — POC URINE PREG, ED: PREG TEST UR: NEGATIVE

## 2016-09-08 LAB — BASIC METABOLIC PANEL
ANION GAP: 7 (ref 5–15)
BUN: 15 mg/dL (ref 6–20)
CO2: 23 mmol/L (ref 22–32)
Calcium: 9 mg/dL (ref 8.9–10.3)
Chloride: 108 mmol/L (ref 101–111)
Creatinine, Ser: 0.7 mg/dL (ref 0.44–1.00)
GFR calc Af Amer: >60 mL/min (ref >60)
GLUCOSE: 101 mg/dL — AB (ref 65–99)
POTASSIUM: 3.5 mmol/L (ref 3.5–5.1)
SODIUM: 138 mmol/L (ref 135–145)

## 2016-09-08 LAB — RAPID URINE DRUG SCREEN, HOSP PERFORMED
Amphetamines: NOT DETECTED
BARBITURATES: NOT DETECTED
BENZODIAZEPINES: NOT DETECTED
Cocaine: NOT DETECTED
Opiates: NOT DETECTED
Tetrahydrocannabinol: NOT DETECTED

## 2016-09-08 MED ORDER — LORAZEPAM 1 MG PO TABS
1.0000 mg | ORAL_TABLET | Freq: Once | ORAL | Status: AC
  Administered 2016-09-08: 1 mg via ORAL
  Filled 2016-09-08: qty 1

## 2016-09-08 MED ORDER — SODIUM CHLORIDE 0.9 % IV BOLUS (SEPSIS)
1000.0000 mL | Freq: Once | INTRAVENOUS | Status: AC
  Administered 2016-09-08: 1000 mL via INTRAVENOUS

## 2016-09-08 NOTE — ED Provider Notes (Signed)
[facility] DEPT Provider Note   CSN: [id number] Arrival date & time: 09/08/16  0802     History   Chief Complaint Chief Complaint  Patient presents with  . Numbness    HPI Mckenzie Park is a 41 y.o. female.  HPI   Mckenzie Park is a 41 y.o. female who presents to the Emergency Department complaining of facial numbness and intermittent numbness and tingling to both hands and feet.  Symptoms began one day prior to arrival.  She reports initially her entire face was numb, but now has symptoms to the lower face only.  She reports her coworkers told her she was slurring her words yesterday, but has resolved.  She was seen at urgent care and advised to come to Er for evaluation yesterday, but she decided to wait until today.  She denies new medications, headaches, fever, vomiting, facial or extremity weakness, and visual changes.   Past Medical History:  Diagnosis Date  . Hypertension   . Kidney stone     Patient Active Problem List   Diagnosis Date Noted  . Chronic migraine without aura without status migrainosus, not intractable 08/01/2015  . Essential hypertension 08/01/2015  . Depression 08/01/2015    Past Surgical History:  Procedure Laterality Date  . ABDOMINAL HYSTERECTOMY    . CHOLECYSTECTOMY    . TUBAL LIGATION      OB History    No data available       Home Medications    Prior to Admission medications   Medication Sig Start Date End Date Taking? Authorizing Provider  aspirin (ASPIRIN EC) 81 MG EC tablet Take 81 mg by mouth daily. Swallow whole.    Historical Provider, MD  eletriptan (RELPAX) 20 MG tablet Take 1 tablet earliest onset of headache.  May repeat once in 2 hours if headache persists or recurs. 09/02/16   Pieter Partridge, DO  losartan-hydrochlorothiazide (HYZAAR) 50-12.5 MG tablet Take 1 tablet by mouth daily.    Historical Provider, MD  nortriptyline (PAMELOR) 50 MG capsule Take 2 capsules (100 mg total) by mouth at bedtime. 03/28/16   Pieter Partridge, DO  ondansetron (ZOFRAN-ODT) 4 MG disintegrating tablet Take 1 tablet (4 mg total) by mouth every 8 (eight) hours as needed for nausea or vomiting. Reported on 11/15/2015 03/28/16   Pieter Partridge, DO  SUMAtriptan (IMITREX) 100 MG tablet Take 1 tab at earliest onset of headache.  May repeat x1 in 2 hours if headache persists or recurs.  Do not exceed 2 tablets in 24 hours 03/28/16   Pieter Partridge, DO  topiramate (TOPAMAX) 50 MG tablet Take 1 tablet (50 mg total) by mouth at bedtime. 09/02/16   Pieter Partridge, DO    Family History Family History  Problem Relation Age of Onset  . Parkinsonism Father     Social History Social History  Substance Use Topics  . Smoking status: Never Smoker  . Smokeless tobacco: Never Used  . Alcohol use No     Allergies   Patient has no known allergies.   Review of Systems Review of Systems  Constitutional: Negative for chills and fever.  Eyes: Negative for pain and visual disturbance.  Respiratory: Negative for chest tightness and shortness of breath.   Cardiovascular: Negative for chest pain.  Gastrointestinal: Negative for abdominal pain, nausea and vomiting.  Genitourinary: Negative for difficulty urinating and dysuria.  Musculoskeletal: Positive for arthralgias and joint swelling. Negative for neck pain.  Skin: Negative for color change and wound.  Neurological: Positive for numbness. Negative for dizziness, syncope, facial asymmetry, weakness and headaches.  Psychiatric/Behavioral: Negative for confusion. The patient is not nervous/anxious.   All other systems reviewed and are negative.    Physical Exam Updated Vital Signs BP (!) 155/82   Pulse 69   Temp 98 F (36.7 C) (Oral)   Resp 16   Ht 5\' 5"  (1.651 m)   Wt 102.5 kg   SpO2 100%   BMI 37.61 kg/m   Physical Exam  Constitutional: She is oriented to person, place, and time. She appears well-developed and well-nourished. No distress.  HENT:  Head: Atraumatic.  Mouth/Throat:  Uvula is midline, oropharynx is clear and moist and mucous membranes are normal.  Eyes: Conjunctivae and EOM are normal. Pupils are equal, round, and reactive to light.  Neck: Normal range of motion, full passive range of motion without pain and phonation normal. No JVD present. No Kernig's sign noted.  Cardiovascular: Normal rate, regular rhythm and intact distal pulses.   No murmur heard. Pulmonary/Chest: Effort normal and breath sounds normal. No respiratory distress.  Abdominal: Soft. She exhibits no distension and no mass. There is no tenderness. There is no guarding.  Musculoskeletal: Normal range of motion. She exhibits no tenderness or deformity.  Lymphadenopathy:    She has no cervical adenopathy.  Neurological: She is alert and oriented to person, place, and time. No sensory deficit. She exhibits abnormal muscle tone. Gait normal.  CN II-XII intact, no facial weakness, grip strength strong and symmetrical.  No pronator drift.  Nursing note and vitals reviewed.    ED Treatments / Results  Labs (all labs ordered are listed, but only abnormal results are displayed) Labs Reviewed  BASIC METABOLIC PANEL - Abnormal; Notable for the following:       Result Value   Glucose, Bld 101 (*)    All other components within normal limits  URINALYSIS, ROUTINE W REFLEX MICROSCOPIC - Abnormal; Notable for the following:    Color, Urine STRAW (*)    All other components within normal limits  CBC WITH DIFFERENTIAL/PLATELET  RAPID URINE DRUG SCREEN, HOSP PERFORMED  POC URINE PREG, ED    EKG  EKG Interpretation None       Radiology No results found.  Procedures Procedures (including critical care time)  Medications Ordered in ED Medications  sodium chloride 0.9 % bolus 1,000 mL (0 mLs Intravenous Stopped 09/08/16 1127)  LORazepam (ATIVAN) tablet 1 mg (1 mg Oral Given 09/08/16 1127)     Initial Impression / Assessment and Plan / ED Course  I have reviewed the triage vital signs  and the nursing notes.  Pertinent labs & imaging results that were available during my care of the patient were reviewed by me and considered in my medical decision making (see chart for details).     Pt with waxing waning lower facial weakness, focal bilateral tingling of hands and feet.  No focal neuro deficits on exam.  Feeling better after Ativan. Likely stress related.  Labs reassuring. Appears stable for d/c.  Agrees to PMD f/u.    Final Clinical Impressions(s) / ED Diagnoses   Final diagnoses:  Paresthesias    New Prescriptions New Prescriptions   No medications on file     Kem Parkinson, PA-C 09/08/16 [place], DO 09/08/16 2007

## 2016-09-08 NOTE — Discharge Instructions (Signed)
Call your primary provider to arrange a follow-up appt.

## 2016-09-08 NOTE — ED Triage Notes (Signed)
Pt states yesterday at 5am while at work she started having some facial numbness and numbness in both han ds and feet. Pt states her coworkers also told her she sounded like she may be slurring her words. Pt was seen at an urgent care yesterday and advised to come to ED for evaluation.pt arrives today she c/o of bilateral lower arm numbness and numbness in both feet. Pt also states her whole face feels numb. Pt is alert and ox4. Speech is clear at this time. Pt has equal grip strength. ambulatory with steady gait.

## 2016-09-10 ENCOUNTER — Telehealth: Payer: Self-pay | Admitting: Neurology

## 2016-09-10 NOTE — Telephone Encounter (Signed)
Caller: Patient  Urgent? No  Reason for the call: She would like to speak with you regarding her [employer] paperwork. Thanks

## 2016-10-02 ENCOUNTER — Other Ambulatory Visit: Payer: Self-pay

## 2016-10-02 MED ORDER — ELETRIPTAN HYDROBROMIDE 20 MG PO TABS: ORAL_TABLET | ORAL | 2 refills | Status: DC

## 2016-12-09 ENCOUNTER — Ambulatory Visit (INDEPENDENT_AMBULATORY_CARE_PROVIDER_SITE_OTHER): Payer: BLUE CROSS/BLUE SHIELD | Admitting: Neurology

## 2016-12-09 ENCOUNTER — Encounter: Payer: Self-pay | Admitting: Neurology

## 2016-12-09 VITALS — BP 130/80 | HR 58 | Ht 65.5 in | Wt 223.1 lb

## 2016-12-09 DIAGNOSIS — G43009 Migraine without aura, not intractable, without status migrainosus: Secondary | ICD-10-CM | POA: Diagnosis not present

## 2016-12-09 MED ORDER — TOPIRAMATE 100 MG PO TABS: 100.0000 mg | ORAL_TABLET | Freq: Every day | ORAL | 5 refills | Status: DC

## 2016-12-09 MED ORDER — ELETRIPTAN HYDROBROMIDE 20 MG PO TABS: ORAL_TABLET | ORAL | 5 refills | Status: DC

## 2016-12-09 NOTE — Patient Instructions (Signed)
1.  We will increase topiramate to 100mg  at bedtime to see if we can reduce frequency of headaches further 2.  I will refill the eletriptan for you 3.  Follow up in 4 months.

## 2016-12-09 NOTE — Progress Notes (Signed)
NEUROLOGY FOLLOW UP OFFICE NOTE  Mckenzie Park [id number]  HISTORY OF PRESENT ILLNESS: Mckenzie Park is a 41 year old right-handed female with hypertension and past history of kidney stones who follows up for migraines.   UPDATE: Headaches are improved on topiramate.  She is also tolerating eletriptan much better than sumatriptan (does not cause drowsiness). Intensity:  7-8/10 Duration:  1 to 2 hours Frequency:  6 to 7 days per month. Current NSAIDS:  no.  Current analgesics:  Tylenol or BC (takes at work). Takes ASA 81mg  Current triptans:  Relpax 20mg  Current anti-nausea:  Zofran ODT 4mg  Current muscle relaxants:  no Current Antihypertensive medications:  Hyzaar Current Antidepressant medications:  no Current Anticonvulsant medications:  topiramate 50mg  Current Vitamins/Herbal/Supplements:  no Current Antihistamines/Decongestants:  no Other therapy:  no   Caffeine:  1 cup every other day Alcohol:  no Smoker:  no Diet:  Hydrates.  Stopped sugar and salt Exercise:  Not until she is cleared by workman's comp for her hand injury Depression/stress:  Stress is improved since moving back to third shift (which she prefers) Sleep hygiene:  Improved.  She was seen in the ED on 09/08/16 for paresthesias.  Workup was unremarkable.  Likely related to topiramate.  She no longer has paresthesias.   HISTORY: Onset:  Since her 64s Location:  Bifrontal or band-like Quality:  Vice-like/squeezing, pounding in back of head Initial Intensity:  10/10; November: 10/10 Aura:  no Prodrome:  no Associated symptoms:  Nausea, vertigo, photophobia, blurred vision, sometimes vomiting or phonophobia Initial Duration:  All day or unless she goes to sleep; November: goes right to sleep Initial Frequency:  Over 15 days per month; November: 20 headache days (6-7 days are migraine) Triggers/exacerbating factors:  none Relieving factors:  sleep Activity:  Able to force self to function   Past  NSAIDS:  naproxen 500mg  (ineffective) Past analgesics:  Excedrin Migraine Past abortive triptans:  Maxalt, sumatriptan 50mg -100mg  (effective but causes drowsiness) Past muscle relaxants:  Flexeril (for knee) Past anti-nausea:  no Past antihypertensive medications:  no Past antidepressant medications:  Cymbalta, nortriptyline 100mg  Past anticonvulsant medications:  no Past vitamins/Herbal/Supplements:  no Past antihistamines/decongestants:  no Other past medications:  no   Family history of headache:  No.  Father has Parkinson's disease   She has history of fluctuating bradycardia.  She has history of kidney stones 3 times (15-20 years ago)  PAST MEDICAL HISTORY: Past Medical History:  Diagnosis Date  . Hypertension   . Kidney stone     MEDICATIONS: Current Outpatient Prescriptions on File Prior to Visit  Medication Sig Dispense Refill  . aspirin (ASPIRIN EC) 81 MG EC tablet Take 81 mg by mouth daily. Swallow whole.    . losartan-hydrochlorothiazide (HYZAAR) 50-12.5 MG tablet Take 1 tablet by mouth daily.    . nortriptyline (PAMELOR) 50 MG capsule Take 2 capsules (100 mg total) by mouth at bedtime. (Patient not taking: Reported on 09/08/2016) 60 capsule 4  . ondansetron (ZOFRAN-ODT) 4 MG disintegrating tablet Take 1 tablet (4 mg total) by mouth every 8 (eight) hours as needed for nausea or vomiting. Reported on 11/15/2015 20 tablet 4  . SUMAtriptan (IMITREX) 100 MG tablet Take 1 tab at earliest onset of headache.  May repeat x1 in 2 hours if headache persists or recurs.  Do not exceed 2 tablets in 24 hours 10 tablet 4   No current facility-administered medications on file prior to visit.     ALLERGIES: No Known Allergies  FAMILY HISTORY: Family  History  Problem Relation Age of Onset  . Parkinsonism Father     SOCIAL HISTORY: Social History   Social History  . Marital status: Widowed    Spouse name: N/A  . Number of children: N/A  . Years of education: N/A    Occupational History  . Not on file.   Social History Main Topics  . Smoking status: Never Smoker  . Smokeless tobacco: Never Used  . Alcohol use No  . Drug use: No  . Sexual activity: Not on file   Other Topics Concern  . Not on file   Social History Narrative  . No narrative on file    REVIEW OF SYSTEMS: Constitutional: No fevers, chills, or sweats, no generalized fatigue, change in appetite Eyes: No visual changes, double vision, eye pain Ear, nose and throat: No hearing loss, ear pain, nasal congestion, sore throat Cardiovascular: No chest pain, palpitations Respiratory:  No shortness of breath at rest or with exertion, wheezes GastrointestinaI: No nausea, vomiting, diarrhea, abdominal pain, fecal incontinence Genitourinary:  No dysuria, urinary retention or frequency Musculoskeletal:  No neck pain, back pain Integumentary: No rash, pruritus, skin lesions Neurological: as above Psychiatric: No depression, insomnia, anxiety Endocrine: No palpitations, fatigue, diaphoresis, mood swings, change in appetite, change in weight, increased thirst Hematologic/Lymphatic:  No purpura, petechiae. Allergic/Immunologic: no itchy/runny eyes, nasal congestion, recent allergic reactions, rashes  PHYSICAL EXAM: Vitals:   12/09/16 1429  BP: 130/80  Pulse: (!) 58   General: No acute distress.  Patient appears well-groomed.  Head:  Normocephalic/atraumatic Eyes:  Fundi examined but not visualized Neck: supple, no paraspinal tenderness, full range of motion Heart:  Regular rate and rhythm Lungs:  Clear to auscultation bilaterally Back: No paraspinal tenderness Neurological Exam: alert and oriented to person, place, and time. Attention span and concentration intact, recent and remote memory intact, fund of knowledge intact.  Speech fluent and not dysarthric, language intact.  CN II-XII intact. Bulk and tone normal, muscle strength 5/5 throughout.  Sensation to light touch intact.  Deep  tendon reflexes 2+ throughout.  Finger to nose and heel to shin testing intact.  Gait normal, Romberg negative.  IMPRESSION: Migraine without aura  PLAN: 1.  We will increase topiramate to 100mg  at bedtime to see if we can reduce frequency of headaches further.  I discussed possibility of paresthesias. 2.  I will refill the eletriptan for you 3.  Follow up in 4 months.  Metta Clines, DO  CC: [facility]

## 2017-04-14 ENCOUNTER — Ambulatory Visit: Payer: Self-pay | Admitting: Neurology

## 2017-11-24 DIAGNOSIS — E785 Hyperlipidemia, unspecified: Secondary | ICD-10-CM | POA: Insufficient documentation

## 2017-11-24 DIAGNOSIS — E559 Vitamin D deficiency, unspecified: Secondary | ICD-10-CM | POA: Insufficient documentation

## 2017-11-24 DIAGNOSIS — F419 Anxiety disorder, unspecified: Secondary | ICD-10-CM | POA: Insufficient documentation

## 2017-11-24 DIAGNOSIS — F330 Major depressive disorder, recurrent, mild: Secondary | ICD-10-CM | POA: Insufficient documentation

## 2018-02-05 LAB — IFOBT (OCCULT BLOOD): IFOBT: NEGATIVE

## 2018-05-10 ENCOUNTER — Encounter (HOSPITAL_COMMUNITY): Payer: Self-pay

## 2018-05-10 ENCOUNTER — Telehealth (HOSPITAL_COMMUNITY): Payer: Self-pay

## 2018-05-10 ENCOUNTER — Ambulatory Visit (HOSPITAL_COMMUNITY)
Admission: EM | Admit: 2018-05-10 | Discharge: 2018-05-10 | Disposition: A | Discharge status: Home / Self Care | Payer: BLUE CROSS/BLUE SHIELD | Attending: Internal Medicine | Admitting: Internal Medicine

## 2018-05-10 DIAGNOSIS — J0100 Acute maxillary sinusitis, unspecified: Secondary | ICD-10-CM | POA: Diagnosis not present

## 2018-05-10 DIAGNOSIS — R51 Headache: Secondary | ICD-10-CM | POA: Diagnosis present

## 2018-05-10 DIAGNOSIS — N3001 Acute cystitis with hematuria: Secondary | ICD-10-CM | POA: Diagnosis not present

## 2018-05-10 DIAGNOSIS — R102 Pelvic and perineal pain: Secondary | ICD-10-CM | POA: Diagnosis present

## 2018-05-10 LAB — POCT URINALYSIS DIP (DEVICE)
BILIRUBIN URINE: NEGATIVE
GLUCOSE, UA: NEGATIVE mg/dL
KETONES UR: NEGATIVE mg/dL
Leukocytes, UA: NEGATIVE
Nitrite: POSITIVE — AB
Protein, ur: NEGATIVE mg/dL
Specific Gravity, Urine: >=1.03 (ref 1.005–1.030)
Urobilinogen, UA: 0.2 mg/dL (ref 0.0–1.0)
pH: 6.5 (ref 5.0–8.0)

## 2018-05-10 MED ORDER — AMOXICILLIN 500 MG PO CAPS: 500.0000 mg | ORAL_CAPSULE | Freq: Three times a day (TID) | ORAL | 0 refills | Status: DC

## 2018-05-10 MED ORDER — SULFAMETHOXAZOLE-TRIMETHOPRIM 800-160 MG PO TABS: 1.0000 | ORAL_TABLET | Freq: Two times a day (BID) | ORAL | 0 refills | Status: DC

## 2018-05-10 NOTE — ED Triage Notes (Signed)
Pt present right side facial pain,  Headache and right side abdominal pressure. Symptoms started two days ago .  Pt states that her urine is very cloudy with slight odor.

## 2018-05-10 NOTE — ED Provider Notes (Signed)
MRN: [id number] DOB: 1976-03-04  Subjective:   Mckenzie Park is a 42 y.o. female presenting for 2 day history of sinus pressure, right sided facial pain radiating to back of her right ear, neck, subjective fever/facial flushing.  Patient reports that the facial pain she is having is not consistent with her typical migraines.  Has been using Zyrtec, benadryl; uses this regularly for allergies.  Has also been having urinary pressure, malodorous urine, right sided lower abdominal/pelvic pain.  Denies smoking cigarettes.  Denies confusion, dizziness, ear drainage, throat pain, cough, chest pain, nausea, vomiting, hematuria, diarrhea, constipation, rashes, vaginal discharge, weakness.  No current facility-administered medications for this encounter.   Current Outpatient Medications:  .  aspirin (ASPIRIN EC) 81 MG EC tablet, Take 81 mg by mouth daily. Swallow whole., Disp: , Rfl:  .  eletriptan (RELPAX) 20 MG tablet, Take 1 tablet earliest onset of headache.  May repeat once in 2 hours if headache persists or recurs., Disp: 10 tablet, Rfl: 5 .  losartan-hydrochlorothiazide (HYZAAR) 50-12.5 MG tablet, Take 1 tablet by mouth daily., Disp: , Rfl:  .  nortriptyline (PAMELOR) 50 MG capsule, Take 2 capsules (100 mg total) by mouth at bedtime. (Patient not taking: Reported on 09/08/2016), Disp: 60 capsule, Rfl: 4 .  ondansetron (ZOFRAN-ODT) 4 MG disintegrating tablet, Take 1 tablet (4 mg total) by mouth every 8 (eight) hours as needed for nausea or vomiting. Reported on 11/15/2015, Disp: 20 tablet, Rfl: 4 .  SUMAtriptan (IMITREX) 100 MG tablet, Take 1 tab at earliest onset of headache.  May repeat x1 in 2 hours if headache persists or recurs.  Do not exceed 2 tablets in 24 hours, Disp: 10 tablet, Rfl: 4 .  topiramate (TOPAMAX) 100 MG tablet, Take 1 tablet (100 mg total) by mouth at bedtime., Disp: 30 tablet, Rfl: 5   No Known Allergies  Past Medical History:  Diagnosis Date  . Hypertension   . Kidney stone       Past Surgical History:  Procedure Laterality Date  . ABDOMINAL HYSTERECTOMY    . CHOLECYSTECTOMY    . TUBAL LIGATION      Objective:   Vitals: BP 139/78 (BP Location: Right Arm)   Pulse (!) 46   Temp 98.5 F (36.9 C) (Oral)   Resp 16   SpO2 100%   Physical Exam Constitutional:      Appearance: She is well-developed.  HENT:     Right Ear: Tympanic membrane normal. There is no impacted cerumen.     Left Ear: Tympanic membrane normal. There is no impacted cerumen.     Nose: No congestion or rhinorrhea.     Right Sinus: Maxillary sinus tenderness present.     Left Sinus: No maxillary sinus tenderness.     Mouth/Throat:     Mouth: Mucous membranes are moist.     Pharynx: No oropharyngeal exudate or posterior oropharyngeal erythema.  Eyes:     General: No scleral icterus.    Extraocular Movements: Extraocular movements intact.     Pupils: Pupils are equal, round, and reactive to light.  Neck:     Musculoskeletal: Normal range of motion and neck supple. No neck rigidity or muscular tenderness.  Cardiovascular:     Rate and Rhythm: Normal rate and regular rhythm.     Heart sounds: No murmur. No friction rub. No gallop.   Pulmonary:     Effort: No respiratory distress.     Breath sounds: No wheezing or rales.  Abdominal:  General: There is no distension.     Tenderness: There is no abdominal tenderness. There is no right CVA tenderness, left CVA tenderness, guarding or rebound.  Lymphadenopathy:     Cervical: No cervical adenopathy.  Skin:    General: Skin is warm and dry.  Neurological:     General: No focal deficit present.     Mental Status: She is alert and oriented to person, place, and time.     Cranial Nerves: No cranial nerve deficit.     Motor: No weakness.     Coordination: Coordination normal.     Gait: Gait normal.     Deep Tendon Reflexes: Reflexes normal.  Psychiatric:        Mood and Affect: Mood normal.        Behavior: Behavior normal.         Thought Content: Thought content normal.    Results for orders placed or performed during the hospital encounter of 05/10/18 (from the past 24 hour(s))  POCT urinalysis dip (device)     Status: Abnormal   Collection Time: 05/10/18  5:20 PM  Result Value Ref Range   Glucose, UA NEGATIVE NEGATIVE mg/dL   Bilirubin Urine NEGATIVE NEGATIVE   Ketones, ur NEGATIVE NEGATIVE mg/dL   Specific Gravity, Urine >=1.030 1.005 - 1.030   Hgb urine dipstick TRACE (A) NEGATIVE   pH 6.5 5.0 - 8.0   Protein, ur NEGATIVE NEGATIVE mg/dL   Urobilinogen, UA 0.2 0.0 - 1.0 mg/dL   Nitrite POSITIVE (A) NEGATIVE   Leukocytes, UA NEGATIVE NEGATIVE    Assessment and Plan :   Acute cystitis with hematuria  Acute non-recurrent maxillary sinusitis  Will cover for cystitis with Bactrim, urine culture pending.  Patient also wanted to have her sinusitis addressed concurrently with amoxicillin. Counseled patient on potential for adverse effects with medications prescribed today, patient verbalized understanding. ER and return-to-clinic precautions discussed, patient verbalized understanding.      Mckenzie Park, [place] 05/10/18 3532

## 2018-05-13 ENCOUNTER — Telehealth (HOSPITAL_COMMUNITY): Payer: Self-pay | Admitting: Emergency Medicine

## 2018-05-13 LAB — URINE CULTURE: Culture: >=100000 — AB

## 2018-05-13 MED ORDER — NITROFURANTOIN MONOHYD MACRO 100 MG PO CAPS: 100.0000 mg | ORAL_CAPSULE | Freq: Two times a day (BID) | ORAL | 0 refills | Status: DC

## 2018-05-13 NOTE — Telephone Encounter (Signed)
Urine culture was positive for ESCHERICHIA COLI resistant to BACTRIM given at urgent care visit. Prescription for macrobid per protocol sent to pharmacy of choice. Pt contacted and made aware, all questions answered.

## 2019-02-07 ENCOUNTER — Other Ambulatory Visit: Payer: Self-pay

## 2019-02-07 ENCOUNTER — Ambulatory Visit (INDEPENDENT_AMBULATORY_CARE_PROVIDER_SITE_OTHER): Payer: BLUE CROSS/BLUE SHIELD | Admitting: Cardiology

## 2019-02-07 ENCOUNTER — Encounter: Payer: Self-pay | Admitting: Cardiology

## 2019-02-07 VITALS — BP 136/85 | HR 51 | Ht 66.0 in | Wt 246.1 lb

## 2019-02-07 DIAGNOSIS — E6609 Other obesity due to excess calories: Secondary | ICD-10-CM | POA: Diagnosis not present

## 2019-02-07 DIAGNOSIS — R002 Palpitations: Secondary | ICD-10-CM

## 2019-02-07 DIAGNOSIS — I10 Essential (primary) hypertension: Secondary | ICD-10-CM | POA: Diagnosis not present

## 2019-02-07 DIAGNOSIS — R0789 Other chest pain: Secondary | ICD-10-CM

## 2019-02-07 DIAGNOSIS — E7800 Pure hypercholesterolemia, unspecified: Secondary | ICD-10-CM

## 2019-02-07 DIAGNOSIS — Z6839 Body mass index (BMI) 39.0-39.9, adult: Secondary | ICD-10-CM

## 2019-02-07 DIAGNOSIS — E66812 Obesity, class 2: Secondary | ICD-10-CM

## 2019-02-07 MED ORDER — VERAPAMIL HCL ER 180 MG PO TBCR: 180.0000 mg | EXTENDED_RELEASE_TABLET | Freq: Every day | ORAL | 2 refills | Status: DC

## 2019-02-07 NOTE — Progress Notes (Signed)
Primary Physician/Referring:  Velna Hatchet, MD  Patient ID: Mckenzie Park, female    DOB: April 03, 1976, 43 y.o.   MRN: [id number]  Chief Complaint  Patient presents with  . Chest Pain    last seen 2109  . Palpitations   HPI:    Mckenzie Park  is a 43 y.o. Caucasian female with hypertension, moderate obesity, presents on a urgent basis for evaluation of chest pain and palpitations.  I had seen her 1.5 years ago for atypical and musculoskeletal type chest pain and she had undergone treadmill stress test in March 2019 in which she had excellent exercise tolerance without evidence of ischemia.  Echocardiogram at revealed mild to moderate MR and TR but unchanged from 04/11/2015.  Describes chest discomfort as tightness to heaviness in the middle of the chest, no relationship to exertion activity, can happen even when she is laying in bed and lasts minutes to hours.  No radiation of the pain, no other associated symptoms.  She also complains of palpitations, mostly occurring at rest and feels like she skips a beat and the heart stops for a 2nd, he is a feeling of uncomfortable chest discomfort during this episodes, occurs randomly throughout the day.  No other associated symptoms, no dizziness or syncope.  Past Medical History:  Diagnosis Date  . Hypertension   . Kidney stone    Past Surgical History:  Procedure Laterality Date  . ABDOMINAL HYSTERECTOMY    . CHOLECYSTECTOMY    . TUBAL LIGATION     Social History   Socioeconomic History  . Marital status: Widowed    Spouse name: Not on file  . Number of children: Not on file  . Years of education: Not on file  . Highest education level: Not on file  Occupational History  . Not on file  Social Needs  . Financial resource strain: Not on file  . Food insecurity    Worry: Not on file    Inability: Not on file  . Transportation needs    Medical: Not on file    Non-medical: Not on file  Tobacco Use  . Smoking status: Never  Smoker  . Smokeless tobacco: Never Used  Substance and Sexual Activity  . Alcohol use: No  . Drug use: No  . Sexual activity: Not on file  Lifestyle  . Physical activity    Days per week: Not on file    Minutes per session: Not on file  . Stress: Not on file  Relationships  . Social Herbalist on phone: Not on file    Gets together: Not on file    Attends religious service: Not on file    Active member of club or organization: Not on file    Attends meetings of clubs or organizations: Not on file    Relationship status: Not on file  . Intimate partner violence    Fear of current or ex partner: Not on file    Emotionally abused: Not on file    Physically abused: Not on file    Forced sexual activity: Not on file  Other Topics Concern  . Not on file  Social History Narrative  . Not on file   ROS  Review of Systems  Constitution: Negative for chills, decreased appetite, malaise/fatigue and weight gain.  Cardiovascular: Positive for chest pain and irregular heartbeat. Negative for dyspnea on exertion, leg swelling, near-syncope and syncope.  Endocrine: Negative for cold intolerance.  Hematologic/Lymphatic: Does not bruise/bleed easily.  Musculoskeletal: Negative for joint swelling.  Gastrointestinal: Negative for abdominal pain, anorexia, change in bowel habit, hematochezia and melena.  Neurological: Negative for headaches and light-headedness.  Psychiatric/Behavioral: Positive for depression. Negative for substance abuse and suicidal ideas. The patient is nervous/anxious.   All other systems reviewed and are negative.  Objective   Vitals with BMI 02/07/2019 05/10/2018 12/09/2016  Height 5\' 6"  - 5' 5.5"  Weight 246 lbs 2 oz - 223 lbs 1 oz  BMI 99991111 - [id number]  Systolic [id number]  Diastolic 85 78 80  Pulse 51 46 58    Blood pressure 136/85, pulse (!) 51, height 5\' 6"  (1.676 m), weight 246 lb 1.6 oz (111.6 kg), SpO2 99 %. Body mass index is 39.72 kg/m.    Physical Exam  Constitutional:  Well-built and nearly morbidly obese with a BMI of 39, no acute distress.  HENT:  Head: Atraumatic.  Eyes: Conjunctivae are normal.  Neck: Neck supple. No JVD present. No thyromegaly present.  Cardiovascular: Normal rate, regular rhythm, normal heart sounds and intact distal pulses. Exam reveals no gallop.  No murmur heard. Pulmonary/Chest: Effort normal and breath sounds normal.  Abdominal: Soft. Bowel sounds are normal.  Musculoskeletal: Normal range of motion.  Neurological: She is alert.  Skin: Skin is warm and dry.  Psychiatric: She has a normal mood and affect.   Radiology: No results found.  Laboratory examination:   Labs 12/2018: Total cholesterol 206, triglycerides 70, HDL 42, LDL 149.  No results for input(s): NA, K, CL, CO2, GLUCOSE, BUN, CREATININE, CALCIUM, GFRNONAA, GFRAA in the last 8760 hours. CMP Latest Ref Rng & Units 09/08/2016  Glucose 65 - 99 mg/dL 101(H)  BUN 6 - 20 mg/dL 15  Creatinine 0.44 - 1.00 mg/dL 0.70  Sodium 135 - 145 mmol/L 138  Potassium 3.5 - 5.1 mmol/L 3.5  Chloride 101 - 111 mmol/L 108  CO2 22 - 32 mmol/L 23  Calcium 8.9 - 10.3 mg/dL 9.0   CBC Latest Ref Rng & Units 09/08/2016  WBC 4.0 - 10.5 K/uL 8.3  Hemoglobin 12.0 - 15.0 g/dL 13.4  Hematocrit 36.0 - 46.0 % 38.8  Platelets 150 - 400 K/uL 293   Lipid Panel  No results found for: CHOL, TRIG, HDL, CHOLHDL, VLDL, LDLCALC, LDLDIRECT HEMOGLOBIN A1C No results found for: HGBA1C, MPG TSH No results for input(s): TSH in the last 8760 hours. Medications   Prior to Admission medications   Medication Sig Start Date End Date Taking? Authorizing Provider  aspirin (ASPIRIN EC) 81 MG EC tablet Take 81 mg by mouth daily. Swallow whole.   Yes [provider]  Cholecalciferol (D-3-5) 125 MCG (5000 UT) capsule Take 5,000 Units by mouth daily.   Yes [provider]  losartan (COZAAR) 50 MG tablet Take 50 mg by mouth daily.   Yes [provider]     Current Outpatient Medications  Medication Instructions  . aspirin (ASPIRIN EC) 81 mg, Oral, Daily, Swallow whole.  Marland Kitchen D-3-5 5,000 Units, Oral, Daily  . losartan (COZAAR) 50 mg, Oral, Daily  . verapamil (CALAN-SR) 180 mg, Oral, Daily at bedtime    Cardiac Studies:   Treadmill stress test  08/07/2017: Indication: Chest pain The patient exercised on Bruce protocol for 09:12 min. Patient achieved 10.46 METS and reached HR 150 bpm, which is 84 % of maximum age-predicted HR. Stress test terminated due to light headed , SOB, tightness of chest and chest painExercise capacity was normal.  HR Response to Exercise: Appropriate. BP  Response to Exercise: Normal resting BP- appropriate response. Chest Pain: limiting. Arrhythmias: none. ST Changes: With peak exercise there was no ST-T changes of ischemia.  Overall Impression: Normal stress test. Continue primary/secondary prevention.  Echocardiogram [11/11/2016]: Left ventricle cavity is normal in size. Normal global wall motion. Normal diastolic filling pattern, normal LAP. Calculated EF 60%. Left atrial cavity is mildly dilated at 4.1 cm. Mild (Grade I) to at most mild to moderate posteriorly directed mitral regurgitation. Mild to moderate tricuspid regurgitation. No evidence of pulmonary hypertension. Compared to the study done on 04/11/2015, no significant change. PA pressure was previously measured at 35 mmHg.  Holter Monitor [04/06/2015]: Patient's symptoms of dizziness correlated with sinus bradycardia at the rate of 47-49 bpm. Episodes of chest pain and shortness of breath and palpitations revealed normal sinus rhythm. Asymptomatic episodes of ventricular bigeminy, 3-4 beat runs of atrial tachycardia was evident. One episode off what appears to be brief episode off probable idioventricular rhythm (wide-complex rhythm at the rate of 81 bpm. 2 episodes lasted for less than 1 minute at 4:35 AM). Consider sleep apnea  related rhythm disturbance.  Assessment     ICD-10-CM   1. Atypical chest pain  R07.89 EKG 12-Lead    PCV ECHOCARDIOGRAM COMPLETE  2. Palpitations  R00.2 verapamil (CALAN-SR) 180 MG CR tablet  3. Primary hypertension  I10 verapamil (CALAN-SR) 180 MG CR tablet    PCV ECHOCARDIOGRAM COMPLETE  4. Class 2 obesity due to excess calories without serious comorbidity with body mass index (BMI) of 39.0 to 39.9 in adult  E66.09    Z68.39   5. Hypercholesteremia  E78.00     EKG 02/07/2019: Marked sinus bradycardia at rate of 53 bpm, normal axis, no evidence of ischemia, otherwise normal EKG.  Recommendations:   Patient made an appointment to see me on a stat basis due to chest pain, palpitations, elevated blood pressure.  Patient has been extremely distressed since her father passed away in 07-15-18 and is still grieving.  Unfortunately also had death of her husband's daughter in March 2020 as well.  I suspect extreme anxiety, depression leading to her symptoms of chest pain and also she discontinued her blood pressure medications, now back on losartan, blood pressure is much improved.  She also continues to have palpitations again with suggest PACs and PVCs.  I do not suspect SVT or atrial fibrillation. Chest pain symptoms do not suggest angina, patient was having chest pain and the EKG was completely normal.    I will add verapamil SR 180 mg daily which may help with both palpitations and hypertension, if there is any component of coronary spasm, it'll also help this.  I do not think she needs stress testing or coronary CTA.  She had excellent exercise tolerance test a year ago when I did the stress test in March 2019.  I could consider repeating an event monitor.  For now I have ordered an echocardiogram to evaluate LV systolic function and to look for any other abnormalities.  I'll like to see her back in 6 weeks.  I discussed with Dr. Ardeth Perfect, to consider starting her on antidepressants and  antianxiety medications.  We also discussed her labs, she does have hyperlipidemia, probably related to her diet and lifestyle.  I do not think we should target the lipids are present, we can address this at a later date.  Adrian Prows, MD, [facility] 02/07/2019, 9:37 PM [facility]. [place] Pager: [phone] Office: [phone] If no answer Cell [phone]

## 2019-02-11 DIAGNOSIS — R002 Palpitations: Secondary | ICD-10-CM | POA: Insufficient documentation

## 2019-02-17 ENCOUNTER — Other Ambulatory Visit: Payer: Self-pay

## 2019-02-17 ENCOUNTER — Ambulatory Visit (INDEPENDENT_AMBULATORY_CARE_PROVIDER_SITE_OTHER): Payer: BLUE CROSS/BLUE SHIELD

## 2019-02-17 DIAGNOSIS — R0789 Other chest pain: Secondary | ICD-10-CM | POA: Diagnosis not present

## 2019-02-17 DIAGNOSIS — I10 Essential (primary) hypertension: Secondary | ICD-10-CM

## 2019-03-23 ENCOUNTER — Ambulatory Visit: Payer: BC Managed Care – PPO | Admitting: Cardiology

## 2019-03-25 ENCOUNTER — Ambulatory Visit: Payer: BC Managed Care – PPO | Admitting: Cardiology

## 2019-04-28 ENCOUNTER — Ambulatory Visit: Payer: BC Managed Care – PPO | Admitting: Cardiology

## 2019-04-28 NOTE — Progress Notes (Deleted)
Primary Physician/Referring:  Velna Hatchet, MD  Patient ID: Mckenzie Park, female    DOB: 26-Jun-1975, 43 y.o.   MRN: [id number]  No chief complaint on file.  HPI:    Mckenzie Park  is a 43 y.o. Caucasian female with hypertension, moderate obesity, presents on a urgent basis for evaluation of chest pain and palpitations.  I had seen her 1.5 years ago for atypical and musculoskeletal type chest pain and she had undergone treadmill stress test in March 2019 in which she had excellent exercise tolerance without evidence of ischemia.  Echocardiogram at revealed mild to moderate MR and TR but unchanged from 04/11/2015.  She was recently seen for acute visit for chest pain, palpitations, and elevated blood pressure.  Her father had also recently passed away and she was still grieving.  She had improvement in her blood pressure with being back on losartan.  Grieving was felt to be contributing to her chest discomfort and elevated blood pressure.  She was started on verapamil at her last office visit and underwent echocardiogram to evaluate LV systolic function and look for other abnormalities.  She now presents for follow-up.  Past Medical History:  Diagnosis Date  . Hypertension   . Kidney stone    Past Surgical History:  Procedure Laterality Date  . ABDOMINAL HYSTERECTOMY    . CHOLECYSTECTOMY    . TUBAL LIGATION     Social History   Socioeconomic History  . Marital status: Widowed    Spouse name: Not on file  . Number of children: Not on file  . Years of education: Not on file  . Highest education level: Not on file  Occupational History  . Not on file  Social Needs  . Financial resource strain: Not on file  . Food insecurity    Worry: Not on file    Inability: Not on file  . Transportation needs    Medical: Not on file    Non-medical: Not on file  Tobacco Use  . Smoking status: Never Smoker  . Smokeless tobacco: Never Used  Substance and Sexual Activity  . Alcohol  use: No  . Drug use: No  . Sexual activity: Not on file  Lifestyle  . Physical activity    Days per week: Not on file    Minutes per session: Not on file  . Stress: Not on file  Relationships  . Social Herbalist on phone: Not on file    Gets together: Not on file    Attends religious service: Not on file    Active member of club or organization: Not on file    Attends meetings of clubs or organizations: Not on file    Relationship status: Not on file  . Intimate partner violence    Fear of current or ex partner: Not on file    Emotionally abused: Not on file    Physically abused: Not on file    Forced sexual activity: Not on file  Other Topics Concern  . Not on file  Social History Narrative  . Not on file   ROS  Review of Systems  Constitution: Negative for chills, decreased appetite, malaise/fatigue and weight gain.  Cardiovascular: Positive for chest pain and irregular heartbeat. Negative for dyspnea on exertion, leg swelling, near-syncope and syncope.  Endocrine: Negative for cold intolerance.  Hematologic/Lymphatic: Does not bruise/bleed easily.  Musculoskeletal: Negative for joint swelling.  Gastrointestinal: Negative for abdominal pain, anorexia, change in bowel habit, hematochezia and melena.  Neurological: Negative for headaches and light-headedness.  Psychiatric/Behavioral: Positive for depression. Negative for substance abuse and suicidal ideas. The patient is nervous/anxious.   All other systems reviewed and are negative.  Objective   Vitals with BMI 02/07/2019 05/10/2018 12/09/2016  Height 5\' 6"  - 5' 5.5"  Weight 246 lbs 2 oz - 223 lbs 1 oz  BMI 99991111 - [id number]  Systolic [id number]  Diastolic 85 78 80  Pulse 51 46 58    There were no vitals taken for this visit. There is no height or weight on file to calculate BMI.   Physical Exam  Constitutional:  Well-built and nearly morbidly obese with a BMI of 39, no acute distress.  HENT:  Head:  Atraumatic.  Eyes: Conjunctivae are normal.  Neck: Neck supple. No JVD present. No thyromegaly present.  Cardiovascular: Normal rate, regular rhythm, normal heart sounds and intact distal pulses. Exam reveals no gallop.  No murmur heard. Pulmonary/Chest: Effort normal and breath sounds normal.  Abdominal: Soft. Bowel sounds are normal.  Musculoskeletal: Normal range of motion.  Neurological: She is alert.  Skin: Skin is warm and dry.  Psychiatric: She has a normal mood and affect.   Radiology: No results found.  Laboratory examination:   No results for input(s): NA, K, CL, CO2, GLUCOSE, BUN, CREATININE, CALCIUM, GFRNONAA, GFRAA in the last 8760 hours. CMP Latest Ref Rng & Units 09/08/2016  Glucose 65 - 99 mg/dL 101(H)  BUN 6 - 20 mg/dL 15  Creatinine 0.44 - 1.00 mg/dL 0.70  Sodium 135 - 145 mmol/L 138  Potassium 3.5 - 5.1 mmol/L 3.5  Chloride 101 - 111 mmol/L 108  CO2 22 - 32 mmol/L 23  Calcium 8.9 - 10.3 mg/dL 9.0   CBC Latest Ref Rng & Units 09/08/2016  WBC 4.0 - 10.5 K/uL 8.3  Hemoglobin 12.0 - 15.0 g/dL 13.4  Hematocrit 36.0 - 46.0 % 38.8  Platelets 150 - 400 K/uL 293   Lipid Panel  No results found for: CHOL, TRIG, HDL, CHOLHDL, VLDL, LDLCALC, LDLDIRECT HEMOGLOBIN A1C No results found for: HGBA1C, MPG TSH No results for input(s): TSH in the last 8760 hours.   Labs 12/2018: Total cholesterol 206, triglycerides 70, HDL 42, LDL 149.  Medications   Prior to Admission medications   Medication Sig Start Date End Date Taking? Authorizing Provider  aspirin (ASPIRIN EC) 81 MG EC tablet Take 81 mg by mouth daily. Swallow whole.   Yes [provider]  Cholecalciferol (D-3-5) 125 MCG (5000 UT) capsule Take 5,000 Units by mouth daily.   Yes [provider]  losartan (COZAAR) 50 MG tablet Take 50 mg by mouth daily.   Yes [provider]     Current Outpatient Medications  Medication Instructions  . aspirin (ASPIRIN EC) 81 mg, Oral, Daily, Swallow  whole.  Marland Kitchen D-3-5 5,000 Units, Oral, Daily  . losartan (COZAAR) 50 mg, Oral, Daily  . verapamil (CALAN-SR) 180 mg, Oral, Daily at bedtime    Cardiac Studies:   Holter Monitor [04/06/2015]: Patient's symptoms of dizziness correlated with sinus bradycardia at the rate of 47-49 bpm. Episodes of chest pain and shortness of breath and palpitations revealed normal sinus rhythm. Asymptomatic episodes of ventricular bigeminy, 3-4 beat runs of atrial tachycardia was evident. One episode off what appears to be brief episode off probable idioventricular rhythm (wide-complex rhythm at the rate of 81 bpm. 2 episodes lasted for less than 1 minute at 4:35 AM). Consider sleep apnea related rhythm disturbance.  Treadmill stress  test  08/07/2017: Indication: Chest pain The patient exercised on Bruce protocol for 09:12 min. Patient achieved 10.46 METS and reached HR 150 bpm, which is 84 % of maximum age-predicted HR. Stress test terminated due to light headed , SOB, tightness of chest and chest painExercise capacity was normal.  HR Response to Exercise: Appropriate. BP Response to Exercise: Normal resting BP- appropriate response. Chest Pain: limiting. Arrhythmias: none. ST Changes: With peak exercise there was no ST-T changes of ischemia.  Overall Impression: Normal stress test. Continue primary/secondary prevention.  Echocardiogram 02/17/2019:  Normal LV systolic function with EF 55%. Left ventricle cavity is normal in size. Normal global wall motion. Normal diastolic filling pattern. Calculated EF 55%. Left atrial cavity is mildly dilated at 4.3 cm. Structurally normal tricuspid valve. Mild tricuspid regurgitation. Mild pulmonary hypertension. Estimated pulmonary artery systolic pressure is 34 mm Hg with RA pressure estimated at 3 mm Hg. RVSP measures 34 mmHg. No significant change since 11/11/2016.  Assessment     ICD-10-CM   1. Atypical chest pain  R07.89   2. Palpitations  R00.2   3. Primary  hypertension  I10     EKG 02/07/2019: Marked sinus bradycardia at rate of 53 bpm, normal axis, no evidence of ischemia, otherwise normal EKG.  Recommendations:   Imogine Hogsett  is a 43 y.o.  Caucasian female with hypertension, moderate obesity, presents on a urgent basis for evaluation of chest pain and palpitations. She had undergone treadmill stress test in March 2019 in which she had excellent exercise tolerance without evidence of ischemia.  Echocardiogram at revealed mild to moderate MR and TR but unchanged from 04/11/2015.  Patient made an appointment to see me on a stat basis due to chest pain, palpitations, elevated blood pressure 6 weeks ago and I suspected anxiety and depression and also started Verapamil for HTN and palpitations. ***. She does have hyperlipidemia, probably related to her diet and lifestyle.  I do not think we should target the lipids are present, we can address this at a later date.  Adrian Prows, MD, [facility] 04/28/2019, 10:26 AM [facility]. [place] Pager: [phone] Office: [phone] If no answer Cell [phone]

## 2019-04-29 ENCOUNTER — Other Ambulatory Visit: Payer: Self-pay | Admitting: Cardiology

## 2019-04-29 DIAGNOSIS — I10 Essential (primary) hypertension: Secondary | ICD-10-CM

## 2019-04-29 DIAGNOSIS — R002 Palpitations: Secondary | ICD-10-CM

## 2019-06-01 DIAGNOSIS — M238X1 Other internal derangements of right knee: Secondary | ICD-10-CM | POA: Insufficient documentation

## 2019-06-01 DIAGNOSIS — M6751 Plica syndrome, right knee: Secondary | ICD-10-CM | POA: Insufficient documentation

## 2019-07-23 ENCOUNTER — Other Ambulatory Visit: Payer: Self-pay | Admitting: Cardiology

## 2019-07-23 DIAGNOSIS — R002 Palpitations: Secondary | ICD-10-CM

## 2019-07-23 DIAGNOSIS — I10 Essential (primary) hypertension: Secondary | ICD-10-CM

## 2019-11-08 ENCOUNTER — Ambulatory Visit: Payer: BLUE CROSS/BLUE SHIELD | Admitting: Cardiology

## 2019-11-08 ENCOUNTER — Encounter: Payer: Self-pay | Admitting: Cardiology

## 2019-11-08 ENCOUNTER — Other Ambulatory Visit: Payer: Self-pay

## 2019-11-08 ENCOUNTER — Telehealth: Payer: Self-pay

## 2019-11-08 VITALS — BP 140/68 | HR 51 | Resp 16 | Ht 66.0 in | Wt 260.4 lb

## 2019-11-08 DIAGNOSIS — I10 Essential (primary) hypertension: Secondary | ICD-10-CM

## 2019-11-08 DIAGNOSIS — R002 Palpitations: Secondary | ICD-10-CM

## 2019-11-08 DIAGNOSIS — R0609 Other forms of dyspnea: Secondary | ICD-10-CM

## 2019-11-08 DIAGNOSIS — R072 Precordial pain: Secondary | ICD-10-CM

## 2019-11-08 DIAGNOSIS — Z8249 Family history of ischemic heart disease and other diseases of the circulatory system: Secondary | ICD-10-CM

## 2019-11-08 MED ORDER — PROPRANOLOL HCL 20 MG PO TABS: 20.0000 mg | ORAL_TABLET | Freq: Three times a day (TID) | ORAL | 2 refills | Status: DC

## 2019-11-08 NOTE — Progress Notes (Signed)
Primary Physician/Referring:  Velna Hatchet, MD  Patient ID: Mckenzie Park, female    DOB: 04-15-1976, 44 y.o.   MRN: [id number]  Chief Complaint  Patient presents with  . Chest Pain    Urgent visit, brother recently passed with aortic dissection at age 62 Y  . Palpitations  . Hypertension   HPI:    Mckenzie Park  is a 44 y.o. Caucasian female with hypertension, morbid obesity, presents on a urgent basis for evaluation of chest pain, dyspnea and palpitations.  Her brother at age 45 recently died suddenly with aortic dissection.  In view of her family history of premature coronary artery disease, father having had aortic valve disease and aortic valve replacement, she has been extremely nervous.  Wanted to see me on a 44 urgent basis.  I have seen her several times in the past with chest pain.  She had undergone treadmill stress test in March 2019 in which she had excellent exercise tolerance without evidence of ischemia.  Echocardiogram at revealed mild to moderate MR and TR but unchanged from 04/11/2015.  Describes chest discomfort as tightness to heaviness in the middle of the chest, with or without exertional activity can last from several minutes to much longer time but mostly few minutes.  She has been extremely concerned about aortic dissection in view of family history.  She also has noticed worsening shortness of breath recently.  But she does admit to having gained weight.  Her father passed away last year, her brother is in hospice, her brother passed away last week with aortic dissection and this has made her extremely depressed and anxious as well.  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  Past Medical History:  Diagnosis Date  . Hypertension   . Kidney stone    Past Surgical History:  Procedure Laterality Date  . ABDOMINAL HYSTERECTOMY    . CHOLECYSTECTOMY    . TUBAL LIGATION     Social History   Tobacco Use  . Smoking status: Never Smoker  . Smokeless  tobacco: Never Used  Substance Use Topics  . Alcohol use: No    Marital Status: Married  ROS  Review of Systems  Cardiovascular: Positive for chest pain, dyspnea on exertion and palpitations. Negative for leg swelling.  Gastrointestinal: Negative for melena.  Psychiatric/Behavioral: Positive for depression. The patient is nervous/anxious.    Objective   Vitals with BMI 11/08/2019 02/07/2019 05/10/2018  Height 5\' 6"  5\' 6"  -  Weight 260 lbs 6 oz 246 lbs 2 oz -  BMI 09.38 18.29 -  Systolic 937 169 678  Diastolic 68 85 78  Pulse 51 51 46    Blood pressure 140/68, pulse (!) 51, resp. rate 16, height 5\' 6"  (1.676 m), weight 260 lb 6.4 oz (118.1 kg), SpO2 100 %. Body mass index is 42.03 kg/m.   Physical Exam Constitutional:      Comments: Morbidly obese in no acute distress.  Cardiovascular:     Rate and Rhythm: Normal rate and regular rhythm.     Pulses:          Carotid pulses are 2+ on the right side and 2+ on the left side.      Popliteal pulses are 2+ on the right side and 2+ on the left side.       Dorsalis pedis pulses are 2+ on the right side and 2+ on the left side.       Posterior tibial pulses are 2+ on  the right side and 2+ on the left side.     Heart sounds: Normal heart sounds. No murmur heard.  No gallop.      Comments: No leg edema. JVD difficult to see due to short neck. Pulmonary:     Effort: Pulmonary effort is normal.     Breath sounds: Normal breath sounds.  Abdominal:     General: Bowel sounds are normal.     Palpations: Abdomen is soft.     Comments: Obese. Pannus present    Radiology: No results found.  Laboratory examination:   Labs 12/2018: Total cholesterol 206, triglycerides 70, HDL 42, LDL 149.  No results for input(s): NA, K, CL, CO2, GLUCOSE, BUN, CREATININE, CALCIUM, GFRNONAA, GFRAA in the last 8760 hours. CMP Latest Ref Rng & Units 09/08/2016  Glucose 65 - 99 mg/dL 101(H)  BUN 6 - 20 mg/dL 15  Creatinine 0.44 - 1.00 mg/dL 0.70  Sodium  135 - 145 mmol/L 138  Potassium 3.5 - 5.1 mmol/L 3.5  Chloride 101 - 111 mmol/L 108  CO2 22 - 32 mmol/L 23  Calcium 8.9 - 10.3 mg/dL 9.0   CBC Latest Ref Rng & Units 09/08/2016  WBC 4.0 - 10.5 K/uL 8.3  Hemoglobin 12.0 - 15.0 g/dL 13.4  Hematocrit 36 - 46 % 38.8  Platelets 150 - 400 K/uL 293   Lipid Panel  No results found for: CHOL, TRIG, HDL, CHOLHDL, VLDL, LDLCALC, LDLDIRECT HEMOGLOBIN A1C No results found for: HGBA1C, MPG TSH No results for input(s): TSH in the last 8760 hours. Medications   Current Outpatient Medications  Medication Instructions  . albuterol (VENTOLIN HFA) 108 (90 Base) MCG/ACT inhaler 2 puffs, Inhalation, Every 6 hours PRN  . ALPRAZolam (XANAX) 0.25 MG tablet 1 tablet, Oral, 3 times daily, As needed for Panic /Anxiety  . aspirin (ASPIRIN EC) 81 mg, Oral, Daily, Swallow whole.  Marland Kitchen D-3-5 5,000 Units, Oral, Daily  . escitalopram (LEXAPRO) 10 MG tablet 1 tablet, Oral, Daily  . losartan (COZAAR) 50 mg, Oral, Daily  . propranolol (INDERAL) 20 mg, Oral, 3 times daily  . verapamil (CALAN-SR) 180 MG CR tablet TAKE 1 TABLET(180 MG) BY MOUTH AT BEDTIME    Cardiac Studies:   Treadmill stress test  08/07/2017: Indication: Chest pain The patient exercised on Bruce protocol for 09:12 min. Patient achieved 10.46 METS and reached HR 150 bpm, which is 84 % of maximum age-predicted HR. Stress test terminated due to light headed , SOB, tightness of chest and chest painExercise capacity was normal.  HR Response to Exercise: Appropriate. BP Response to Exercise: Normal resting BP- appropriate response. Chest Pain: limiting. Arrhythmias: none. ST Changes: With peak exercise there was no ST-T changes of ischemia.  Overall Impression: Normal stress test. Continue primary/secondary prevention.  Echocardiogram [11/11/2016]: Left ventricle cavity is normal in size. Normal global wall motion. Normal diastolic filling pattern, normal LAP. Calculated EF 60%. Left atrial cavity  is mildly dilated at 4.1 cm. Mild (Grade I) to at most mild to moderate posteriorly directed mitral regurgitation. Mild to moderate tricuspid regurgitation. No evidence of pulmonary hypertension. Compared to the study done on 04/11/2015, no significant change. PA pressure was previously measured at 35 mmHg.  Holter Monitor [04/06/2015]: Patient's symptoms of dizziness correlated with sinus bradycardia at the rate of 47-49 bpm. Episodes of chest pain and shortness of breath and palpitations revealed normal sinus rhythm. Asymptomatic episodes of ventricular bigeminy, 3-4 beat runs of atrial tachycardia was evident. One episode off what appears to be brief  episode off probable idioventricular rhythm (wide-complex rhythm at the rate of 81 bpm. 2 episodes lasted for less than 1 minute at 4:35 AM). Consider sleep apnea related rhythm disturbance.  EKG:  EKG 11/08/2019: Marked sinus bradycardia at rate of 48 bpm, normal axis, incomplete right bundle branch block.  No evidence of ischemia.  No significant change from 02/07/2019.  Assessment     ICD-10-CM   1. Precordial pain  R07.2 CT CORONARY MORPH W/CTA COR W/SCORE W/CA W/CM &/OR WO/CM    CT CORONARY FRACTIONAL FLOW RESERVE DATA PREP    CT CORONARY FRACTIONAL FLOW RESERVE FLUID ANALYSIS  2. Dyspnea on exertion  R06.00 CT CORONARY MORPH W/CTA COR W/SCORE W/CA W/CM &/OR WO/CM    CT CORONARY FRACTIONAL FLOW RESERVE DATA PREP    CT CORONARY FRACTIONAL FLOW RESERVE FLUID ANALYSIS  3. Essential hypertension  I10 EKG 12-Lead    propranolol (INDERAL) 20 MG tablet    Basic metabolic panel  4. Class 3 severe obesity due to excess calories without serious comorbidity with body mass index (BMI) of 40.0 to 44.9 in adult (HCC)  E66.01    Z68.41   5. Family history of premature CAD  Z82.49 CT CORONARY MORPH W/CTA COR W/SCORE W/CA W/CM &/OR WO/CM    CT CORONARY FRACTIONAL FLOW RESERVE DATA PREP    CT CORONARY FRACTIONAL FLOW RESERVE FLUID ANALYSIS  6. Family  history of dissecting aortic aneurysm. Brother diet at age 23Y  Z56.49 CT CORONARY MORPH W/CTA COR W/SCORE W/CA W/CM &/OR WO/CM    CT CORONARY FRACTIONAL FLOW RESERVE DATA PREP    CT CORONARY FRACTIONAL FLOW RESERVE FLUID ANALYSIS  7. Palpitations  R00.2 propranolol (INDERAL) 20 MG tablet     Recommendations:   Maguire Killmer  is a 44 y.o. Caucasian female with hypertension, morbid obesity, presents on a urgent basis for evaluation of chest pain, dyspnea and palpitations.  Her brother at age 68 recently died suddenly with aortic dissection.  In view of her family history of premature coronary artery disease, father having had aortic valve disease and aortic valve replacement, she has been extremely nervous.  Wanted to see me on a 44 urgent basis.  She has had multiple outpatient evaluations in the past with atypical chest pain, in view of her family history, morbid obesity, mild hyperlipidemia and hypertension as risk factors, I have recommended coronary CTA and we could also certainly visualize thoracic aorta at the same time.  This would be the best direct approach to her management.  I have added propranolol 20 mg p.o. 3 times daily both for anxiety, hypertension and also palpitations.  I am aware that she does have mild exercise-induced asthma but hopefully this should not interfere with her dyspnea. She has asymptomatic Glade Stanford, but given her age, should tolerate BB. Also images with CTA will be better.   She has gained significant amount of weight since I last saw her in September 2020, previously moderately obese no morbidly obese.  I will try to address this on her next office visit in 4 weeks.  We will try to get the CTA done at the earliest.  With regard to anxiety and depression, she is presently also on Lexapro.  She is being closely monitored by Dr. Velna Hatchet.  There is no risk of suicide.  This was a 40-minute encounter.  Adrian Prows, MD, [facility] 11/08/2019, 6:04 PM [place]  [facility] [place] Pager: [phone] Office: [phone] If no answer Cell [phone]

## 2019-11-08 NOTE — Telephone Encounter (Signed)
Patient brought in 5 medication bottles. 5 Bottles returned to patient in bag. Confimed by [place].

## 2019-11-09 ENCOUNTER — Telehealth (HOSPITAL_COMMUNITY): Payer: Self-pay | Admitting: *Deleted

## 2019-11-09 NOTE — Telephone Encounter (Signed)
Reaching out to patient to offer assistance regarding upcoming cardiac imaging study; pt verbalizes understanding of appt date/time, parking situation and where to check in, pre-test NPO status and medications ordered, and verified current allergies; name and call back number provided for further questions should they arise ° °Ziva Nunziata Tai RN Navigator Cardiac Imaging °[facility] °[phone] office °[phone] cell ° °

## 2019-11-10 LAB — BASIC METABOLIC PANEL
BUN/Creatinine Ratio: 15 (ref 9–23)
BUN: 9 mg/dL (ref 6–24)
CO2: 26 mmol/L (ref 20–29)
Calcium: 9 mg/dL (ref 8.7–10.2)
Chloride: 103 mmol/L (ref 96–106)
Creatinine, Ser: 0.6 mg/dL (ref 0.57–1.00)
GFR calc Af Amer: 128 mL/min/{1.73_m2} (ref >59)
GFR calc non Af Amer: 111 mL/min/{1.73_m2} (ref >59)
Glucose: 89 mg/dL (ref 65–99)
Potassium: 4.9 mmol/L (ref 3.5–5.2)
Sodium: 140 mmol/L (ref 134–144)

## 2019-11-11 ENCOUNTER — Other Ambulatory Visit: Payer: Self-pay

## 2019-11-11 ENCOUNTER — Ambulatory Visit (HOSPITAL_COMMUNITY)
Admission: RE | Admit: 2019-11-11 | Discharge: 2019-11-11 | Disposition: A | Discharge status: Home / Self Care | Payer: BC Managed Care – PPO | Source: Ambulatory Visit | Attending: Cardiology | Admitting: Cardiology

## 2019-11-11 ENCOUNTER — Encounter: Payer: BC Managed Care – PPO | Admitting: *Deleted

## 2019-11-11 DIAGNOSIS — Z006 Encounter for examination for normal comparison and control in clinical research program: Secondary | ICD-10-CM

## 2019-11-11 DIAGNOSIS — Z8249 Family history of ischemic heart disease and other diseases of the circulatory system: Secondary | ICD-10-CM | POA: Insufficient documentation

## 2019-11-11 DIAGNOSIS — R0600 Dyspnea, unspecified: Secondary | ICD-10-CM | POA: Diagnosis not present

## 2019-11-11 DIAGNOSIS — R0609 Other forms of dyspnea: Secondary | ICD-10-CM

## 2019-11-11 DIAGNOSIS — R072 Precordial pain: Secondary | ICD-10-CM

## 2019-11-11 MED ORDER — NITROGLYCERIN 0.4 MG SL SUBL
0.8000 mg | SUBLINGUAL_TABLET | Freq: Once | SUBLINGUAL | Status: AC
  Administered 2019-11-11: 0.8 mg via SUBLINGUAL

## 2019-11-11 MED ORDER — IOHEXOL 350 MG/ML SOLN
90.0000 mL | Freq: Once | INTRAVENOUS | Status: AC | PRN
  Administered 2019-11-11: 90 mL via INTRAVENOUS

## 2019-11-11 MED ORDER — NITROGLYCERIN 0.4 MG SL SUBL
SUBLINGUAL_TABLET | SUBLINGUAL | Status: AC
  Filled 2019-11-11: qty 2

## 2019-11-11 NOTE — Research (Signed)
CADFEM Informed Consent                  Subject Name:   Mckenzie Park   Subject met inclusion and exclusion criteria.  The informed consent form, study requirements and expectations were reviewed with the subject and questions and concerns were addressed prior to the signing of the consent form.  The subject verbalized understanding of the trial requirements.  The subject agreed to participate in the CADFEM trial and signed the informed consent.  The informed consent was obtained prior to performance of any protocol-specific procedures for the subject.  A copy of the signed informed consent was given to the subject and a copy was placed in the subject's medical record.   [place] Mamie Hundertmark, Research Assistant  11/11/2019  08:53

## 2019-12-09 ENCOUNTER — Other Ambulatory Visit: Payer: Self-pay | Admitting: Internal Medicine

## 2019-12-09 ENCOUNTER — Encounter: Payer: Self-pay | Admitting: Cardiology

## 2019-12-09 ENCOUNTER — Other Ambulatory Visit: Payer: Self-pay

## 2019-12-09 ENCOUNTER — Ambulatory Visit: Payer: BC Managed Care – PPO | Admitting: Cardiology

## 2019-12-09 VITALS — BP 125/72 | HR 48 | Resp 16 | Ht 66.0 in | Wt 264.0 lb

## 2019-12-09 DIAGNOSIS — R079 Chest pain, unspecified: Secondary | ICD-10-CM

## 2019-12-09 DIAGNOSIS — I10 Essential (primary) hypertension: Secondary | ICD-10-CM

## 2019-12-09 DIAGNOSIS — R6889 Other general symptoms and signs: Secondary | ICD-10-CM

## 2019-12-09 DIAGNOSIS — Z6841 Body Mass Index (BMI) 40.0 and over, adult: Secondary | ICD-10-CM

## 2019-12-09 DIAGNOSIS — G4489 Other headache syndrome: Secondary | ICD-10-CM

## 2019-12-09 DIAGNOSIS — G4700 Insomnia, unspecified: Secondary | ICD-10-CM | POA: Insufficient documentation

## 2019-12-09 MED ORDER — NITROGLYCERIN 0.4 MG SL SUBL: 0.4000 mg | SUBLINGUAL_TABLET | SUBLINGUAL | 3 refills | Status: AC | PRN

## 2019-12-09 NOTE — Progress Notes (Signed)
Primary Physician/Referring:  Velna Hatchet, MD  Patient ID: Mckenzie Park, female    DOB: 1975-07-31, 45 y.o.   MRN: [id number]  Chief Complaint  Patient presents with  . Follow-up    4 weeks  . Precordial pain   HPI:    Mckenzie Park  is a 44 y.o. Caucasian female with hypertension, morbid obesity, presents on a urgent basis for evaluation of chest pain, dyspnea and palpitations.  Her brother at age 68 recently died suddenly with aortic dissection.  In view of her family history of premature coronary artery disease, father having had aortic valve disease and aortic valve replacement, she has been extremely nervous.   I had seen her on a urgent basis on 11/14/2019 for chest pain, I had ordered a coronary CTA and started her on propranolol 20 mg p.o. 3 times daily, she has noticed improvement in chest pain, also she is aware of the results with a coronary CTA.  She now presents for office visit.  Still continues to have occasional episodes of retrosternal chest tightness while at work.  Lasts several minutes and spontaneously subsides after she rests.  No other associated symptoms.  Past Medical History:  Diagnosis Date  . Hypertension   . Kidney stone    Past Surgical History:  Procedure Laterality Date  . ABDOMINAL HYSTERECTOMY    . CHOLECYSTECTOMY    . TUBAL LIGATION     Social History   Tobacco Use  . Smoking status: Never Smoker  . Smokeless tobacco: Never Used  Substance Use Topics  . Alcohol use: No    Marital Status: Married  ROS  Review of Systems  Cardiovascular: Positive for chest pain, dyspnea on exertion and palpitations. Negative for leg swelling.  Gastrointestinal: Negative for melena.  Psychiatric/Behavioral: Positive for depression. The patient is nervous/anxious.    Objective   Vitals with BMI 12/09/2019 11/11/2019 11/11/2019  Height 5\' 6"  - -  Weight 264 lbs - -  BMI 37.34 - -  Systolic 287 681 157  Diastolic 72 78 80  Pulse 48 47 47    Blood  pressure 125/72, pulse (!) 48, resp. rate 16, height 5\' 6"  (1.676 m), weight 264 lb (119.7 kg), SpO2 99 %. Body mass index is 42.61 kg/m.   Physical Exam Constitutional:      Comments: Morbidly obese in no acute distress.  Cardiovascular:     Rate and Rhythm: Normal rate and regular rhythm.     Pulses:          Carotid pulses are 2+ on the right side and 2+ on the left side.      Popliteal pulses are 2+ on the right side and 2+ on the left side.       Dorsalis pedis pulses are 2+ on the right side and 2+ on the left side.       Posterior tibial pulses are 2+ on the right side and 2+ on the left side.     Heart sounds: Normal heart sounds. No murmur heard.  No gallop.      Comments: No leg edema. JVD difficult to see due to short neck. Pulmonary:     Effort: Pulmonary effort is normal.     Breath sounds: Normal breath sounds.  Abdominal:     General: Bowel sounds are normal.     Palpations: Abdomen is soft.     Comments: Obese. Pannus present    Radiology: No results found.  Laboratory examination:   Labs 12/2018:  Total cholesterol 206, triglycerides 70, HDL 42, LDL 149.  Recent Labs    11/09/19 1134  NA 140  K 4.9  CL 103  CO2 26  GLUCOSE 89  BUN 9  CREATININE 0.60  CALCIUM 9.0  GFRNONAA 111  GFRAA 128   CMP Latest Ref Rng & Units 11/09/2019 09/08/2016  Glucose 65 - 99 mg/dL 89 101(H)  BUN 6 - 24 mg/dL 9 15  Creatinine 0.57 - 1.00 mg/dL 0.60 0.70  Sodium 134 - 144 mmol/L 140 138  Potassium 3.5 - 5.2 mmol/L 4.9 3.5  Chloride 96 - 106 mmol/L 103 108  CO2 20 - 29 mmol/L 26 23  Calcium 8.7 - 10.2 mg/dL 9.0 9.0   CBC Latest Ref Rng & Units 09/08/2016  WBC 4.0 - 10.5 K/uL 8.3  Hemoglobin 12.0 - 15.0 g/dL 13.4  Hematocrit 36 - 46 % 38.8  Platelets 150 - 400 K/uL 293   Lipid Panel  No results found for: CHOL, TRIG, HDL, CHOLHDL, VLDL, LDLCALC, LDLDIRECT HEMOGLOBIN A1C No results found for: HGBA1C, MPG TSH No results for input(s): TSH in the last 8760  hours. Medications   Current Outpatient Medications  Medication Instructions  . albuterol (VENTOLIN HFA) 108 (90 Base) MCG/ACT inhaler 2 puffs, Inhalation, Every 6 hours PRN  . ALPRAZolam (XANAX) 0.25 MG tablet 1 tablet, Oral, 3 times daily, As needed for Panic /Anxiety  . aspirin (ASPIRIN EC) 81 mg, Oral, Daily, Swallow whole.  . Azelastine HCl 137 MCG/SPRAY SOLN SMARTSIG:2 Spray(s) Both Nares Every Evening  . D-3-5 5,000 Units, Oral, Daily  . escitalopram (LEXAPRO) 10 MG tablet 1 tablet, Oral, Daily  . losartan (COZAAR) 50 mg, Oral, Daily  . nitroGLYCERIN (NITROSTAT) 0.4 mg, Sublingual, Every 5 min PRN  . propranolol (INDERAL) 20 mg, Oral, 3 times daily  . verapamil (CALAN-SR) 180 MG CR tablet TAKE 1 TABLET(180 MG) BY MOUTH AT BEDTIME    Cardiac Studies:   Treadmill stress test  08/07/2017: Indication: Chest pain The patient exercised on Bruce protocol for 09:12 min. Patient achieved 10.46 METS and reached HR 150 bpm, which is 84 % of maximum age-predicted HR. Stress test terminated due to light headed , SOB, tightness of chest and chest painExercise capacity was normal.  HR Response to Exercise: Appropriate. BP Response to Exercise: Normal resting BP- appropriate response. Chest Pain: limiting. Arrhythmias: none. ST Changes: With peak exercise there was no ST-T changes of ischemia.  Overall Impression: Normal stress test. Continue primary/secondary prevention.  Echocardiogram [11/11/2016]: Left ventricle cavity is normal in size. Normal global wall motion. Normal diastolic filling pattern, normal LAP. Calculated EF 60%. Left atrial cavity is mildly dilated at 4.1 cm. Mild (Grade I) to at most mild to moderate posteriorly directed mitral regurgitation. Mild to moderate tricuspid regurgitation. No evidence of pulmonary hypertension. Compared to the study done on 04/11/2015, no significant change. PA pressure was previously measured at 35 mmHg.  Holter Monitor [04/06/2015]:  Patient's symptoms of dizziness correlated with sinus bradycardia at the rate of 47-49 bpm. Episodes of chest pain and shortness of breath and palpitations revealed normal sinus rhythm. Asymptomatic episodes of ventricular bigeminy, 3-4 beat runs of atrial tachycardia was evident. One episode off what appears to be brief episode off probable idioventricular rhythm (wide-complex rhythm at the rate of 81 bpm. 2 episodes lasted for less than 1 minute at 4:35 AM). Consider sleep apnea related rhythm disturbance.  Coronary CT morphology 11/11/2019: Coronary calcium score: The patient's coronary artery calcium score is 0, which places  the patient in the 0 percentile. Coronary arteries: Normal coronary origins.  Right dominance. No significant incidental noncardiac findings.  EKG:  EKG 11/08/2019: Marked sinus bradycardia at rate of 48 bpm, normal axis, incomplete right bundle branch block.  No evidence of ischemia.  No significant change from 02/07/2019.  Assessment     ICD-10-CM   1. Chest pain with normal coronary angiography  R07.9 nitroGLYCERIN (NITROSTAT) 0.4 MG SL tablet  2. Essential hypertension  I10   3. Class 3 severe obesity due to excess calories without serious comorbidity with body mass index (BMI) of 40.0 to 44.9 in adult [facility])  E66.01    Z68.41      Recommendations:   Mckenzie Park  is a 44 y.o. Caucasian female with hypertension, morbid obesity, presents on a urgent basis for evaluation of chest pain, dyspnea and palpitations.  Her brother at age 65 recently died suddenly with aortic dissection.  In view of her family history of premature coronary artery disease, father having had aortic valve disease and aortic valve replacement, she has been extremely nervous.  Wanted to see me on a urgent basis.  She has had multiple outpatient evaluations in the past with atypical chest pain, in view of her family history, morbid obesity, mild hyperlipidemia and hypertension as risk factors  I  reviewed the results of the coronary CT angiogram, she has normal coronary arteries and calcium score of 0.  Chest tightness in the middle of the chest could still be angina from coronary spasm related to severe stress and anxiety.  Prescribed her sublingual nitroglycerin as per patient request.  Unless she has to use nitroglycerin frequently or chest pain is incessant she will see me back on a as needed basis.  I will request Dr. Ardeth Perfect to take over her care including prescription refills.  Weight loss again discussed with the patient.  She appears to be pleased.  She could certainly wean off of propranolol as tolerated.  I did use this both for severe anxiety and chest pain.   Adrian Prows, MD, [facility] 12/09/2019, 9:38 AM Office: [phone]

## 2020-01-07 ENCOUNTER — Ambulatory Visit
Admission: RE | Admit: 2020-01-07 | Discharge: 2020-01-07 | Disposition: A | Discharge status: Home / Self Care | Payer: BC Managed Care – PPO | Source: Ambulatory Visit | Attending: Internal Medicine | Admitting: Internal Medicine

## 2020-01-07 DIAGNOSIS — G4489 Other headache syndrome: Secondary | ICD-10-CM

## 2020-01-07 DIAGNOSIS — R6889 Other general symptoms and signs: Secondary | ICD-10-CM

## 2020-01-07 MED ORDER — GADOBENATE DIMEGLUMINE 529 MG/ML IV SOLN
20.0000 mL | Freq: Once | INTRAVENOUS | Status: AC | PRN
  Administered 2020-01-07: 20 mL via INTRAVENOUS

## 2020-01-10 ENCOUNTER — Other Ambulatory Visit: Payer: Self-pay | Admitting: Internal Medicine

## 2020-01-10 DIAGNOSIS — G4489 Other headache syndrome: Secondary | ICD-10-CM

## 2020-03-05 ENCOUNTER — Other Ambulatory Visit: Payer: Self-pay | Admitting: Internal Medicine

## 2020-03-05 DIAGNOSIS — Z1231 Encounter for screening mammogram for malignant neoplasm of breast: Secondary | ICD-10-CM

## 2020-03-07 ENCOUNTER — Other Ambulatory Visit: Payer: Self-pay

## 2020-03-07 ENCOUNTER — Ambulatory Visit
Admission: RE | Admit: 2020-03-07 | Discharge: 2020-03-07 | Disposition: A | Discharge status: Home / Self Care | Payer: BC Managed Care – PPO | Source: Ambulatory Visit | Attending: Internal Medicine | Admitting: Internal Medicine

## 2020-03-07 DIAGNOSIS — Z1231 Encounter for screening mammogram for malignant neoplasm of breast: Secondary | ICD-10-CM

## 2020-04-17 ENCOUNTER — Ambulatory Visit (INDEPENDENT_AMBULATORY_CARE_PROVIDER_SITE_OTHER): Payer: BC Managed Care – PPO | Admitting: Podiatry

## 2020-04-17 ENCOUNTER — Encounter: Payer: Self-pay | Admitting: Podiatry

## 2020-04-17 ENCOUNTER — Ambulatory Visit (INDEPENDENT_AMBULATORY_CARE_PROVIDER_SITE_OTHER): Payer: BC Managed Care – PPO

## 2020-04-17 ENCOUNTER — Other Ambulatory Visit: Payer: Self-pay

## 2020-04-17 DIAGNOSIS — M7662 Achilles tendinitis, left leg: Secondary | ICD-10-CM

## 2020-04-17 DIAGNOSIS — M21862 Other specified acquired deformities of left lower leg: Secondary | ICD-10-CM

## 2020-04-17 DIAGNOSIS — M79672 Pain in left foot: Secondary | ICD-10-CM

## 2020-04-17 DIAGNOSIS — M722 Plantar fascial fibromatosis: Secondary | ICD-10-CM | POA: Diagnosis not present

## 2020-04-17 DIAGNOSIS — M216X2 Other acquired deformities of left foot: Secondary | ICD-10-CM | POA: Diagnosis not present

## 2020-04-17 MED ORDER — TRIAMCINOLONE ACETONIDE 40 MG/ML IJ SUSP
10.0000 mg | Freq: Once | INTRAMUSCULAR | Status: AC
  Administered 2020-04-17: 10 mg

## 2020-04-17 MED ORDER — MELOXICAM 15 MG PO TABS
15.0000 mg | ORAL_TABLET | Freq: Every day | ORAL | 3 refills | Status: DC
Start: 2020-04-17 — End: 2020-05-15

## 2020-04-17 MED ORDER — DEXAMETHASONE SODIUM PHOSPHATE 4 MG/ML IJ SOLN
4.0000 mg | Freq: Once | INTRAMUSCULAR | Status: AC
  Administered 2020-04-17: 4 mg

## 2020-04-17 NOTE — Patient Instructions (Signed)
Look for Voltaren gel at the pharmacy over the counter or online (also known as diclofenac 1% gel). Apply to the painful areas 3-4x daily with the supplied dosing card. Allow to dry for 10 minutes before going into socks/shoes   Plantar Fasciitis (Heel Spur Syndrome) with Rehab The plantar fascia is a fibrous, ligament-like, soft-tissue structure that spans the bottom of the foot. Plantar fasciitis is a condition that causes pain in the foot due to inflammation of the tissue. SYMPTOMS  Pain and tenderness on the underneath side of the foot. Pain that worsens with standing or walking. CAUSES  Plantar fasciitis is caused by irritation and injury to the plantar fascia on the underneath side of the foot. Common mechanisms of injury include: Direct trauma to bottom of the foot. Damage to a small nerve that runs under the foot where the main fascia attaches to the heel bone. Stress placed on the plantar fascia due to bone spurs. RISK INCREASES WITH:  Activities that place stress on the plantar fascia (running, jumping, pivoting, or cutting). Poor strength and flexibility. Improperly fitted shoes. Tight calf muscles. Flat feet. Failure to warm-up properly before activity. Obesity. PREVENTION Warm up and stretch properly before activity. Allow for adequate recovery between workouts. Maintain physical fitness: Strength, flexibility, and endurance. Cardiovascular fitness. Maintain a health body weight. Avoid stress on the plantar fascia. Wear properly fitted shoes, including arch supports for individuals who have flat feet.  PROGNOSIS  If treated properly, then the symptoms of plantar fasciitis usually resolve without surgery. However, occasionally surgery is necessary.  RELATED COMPLICATIONS  Recurrent symptoms that may result in a chronic condition. Problems of the lower back that are caused by compensating for the injury, such as limping. Pain or weakness of the foot during push-off  following surgery. Chronic inflammation, scarring, and partial or complete fascia tear, occurring more often from repeated injections.  TREATMENT  Treatment initially involves the use of ice and medication to help reduce pain and inflammation. The use of strengthening and stretching exercises may help reduce pain with activity, especially stretches of the Achilles tendon. These exercises may be performed at home or with a therapist. Your caregiver may recommend that you use heel cups of arch supports to help reduce stress on the plantar fascia. Occasionally, corticosteroid injections are given to reduce inflammation. If symptoms persist for greater than 6 months despite non-surgical (conservative), then surgery may be recommended.   MEDICATION  If pain medication is necessary, then nonsteroidal anti-inflammatory medications, such as aspirin and ibuprofen, or other minor pain relievers, such as acetaminophen, are often recommended. Do not take pain medication within 7 days before surgery. Prescription pain relievers may be given if deemed necessary by your caregiver. Use only as directed and only as much as you need. Corticosteroid injections may be given by your caregiver. These injections should be reserved for the most serious cases, because they may only be given a certain number of times.  HEAT AND COLD Cold treatment (icing) relieves pain and reduces inflammation. Cold treatment should be applied for 10 to 15 minutes every 2 to 3 hours for inflammation and pain and immediately after any activity that aggravates your symptoms. Use ice packs or massage the area with a piece of ice (ice massage). Heat treatment may be used prior to performing the stretching and strengthening activities prescribed by your caregiver, physical therapist, or athletic trainer. Use a heat pack or soak the injury in warm water.  SEEK IMMEDIATE MEDICAL CARE IF: Treatment seems   to offer no benefit, or the condition  worsens. Any medications produce adverse side effects.  EXERCISES- RANGE OF MOTION (ROM) AND STRETCHING EXERCISES - Plantar Fasciitis (Heel Spur Syndrome) These exercises may help you when beginning to rehabilitate your injury. Your symptoms may resolve with or without further involvement from your physician, physical therapist or athletic trainer. While completing these exercises, remember:  Restoring tissue flexibility helps normal motion to return to the joints. This allows healthier, less painful movement and activity. An effective stretch should be held for at least 30 seconds. A stretch should never be painful. You should only feel a gentle lengthening or release in the stretched tissue.  RANGE OF MOTION - Toe Extension, Flexion Sit with your right / left leg crossed over your opposite knee. Grasp your toes and gently pull them back toward the top of your foot. You should feel a stretch on the bottom of your toes and/or foot. Hold this stretch for 10 seconds. Now, gently pull your toes toward the bottom of your foot. You should feel a stretch on the top of your toes and or foot. Hold this stretch for 10 seconds. Repeat  times. Complete this stretch 3 times per day.   RANGE OF MOTION - Ankle Dorsiflexion, Active Assisted Remove shoes and sit on a chair that is preferably not on a carpeted surface. Place right / left foot under knee. Extend your opposite leg for support. Keeping your heel down, slide your right / left foot back toward the chair until you feel a stretch at your ankle or calf. If you do not feel a stretch, slide your bottom forward to the edge of the chair, while still keeping your heel down. Hold this stretch for 10 seconds. Repeat 3 times. Complete this stretch 2 times per day.   STRETCH  Gastroc, Standing Place hands on wall. Extend right / left leg, keeping the front knee somewhat bent. Slightly point your toes inward on your back foot. Keeping your right / left  heel on the floor and your knee straight, shift your weight toward the wall, not allowing your back to arch. You should feel a gentle stretch in the right / left calf. Hold this position for 10 seconds. Repeat 3 times. Complete this stretch 2 times per day.  STRETCH  Soleus, Standing Place hands on wall. Extend right / left leg, keeping the other knee somewhat bent. Slightly point your toes inward on your back foot. Keep your right / left heel on the floor, bend your back knee, and slightly shift your weight over the back leg so that you feel a gentle stretch deep in your back calf. Hold this position for 10 seconds. Repeat 3 times. Complete this stretch 2 times per day.  STRETCH  Gastrocsoleus, Standing  Note: This exercise can place a lot of stress on your foot and ankle. Please complete this exercise only if specifically instructed by your caregiver.  Place the ball of your right / left foot on a step, keeping your other foot firmly on the same step. Hold on to the wall or a rail for balance. Slowly lift your other foot, allowing your body weight to press your heel down over the edge of the step. You should feel a stretch in your right / left calf. Hold this position for 10 seconds. Repeat this exercise with a slight bend in your right / left knee. Repeat 3 times. Complete this stretch 2 times per day.   STRENGTHENING EXERCISES - Plantar   Fasciitis (Heel Spur Syndrome)  These exercises may help you when beginning to rehabilitate your injury. They may resolve your symptoms with or without further involvement from your physician, physical therapist or athletic trainer. While completing these exercises, remember:  Muscles can gain both the endurance and the strength needed for everyday activities through controlled exercises. Complete these exercises as instructed by your physician, physical therapist or athletic trainer. Progress the resistance and repetitions only as guided.  STRENGTH -  Towel Curls Sit in a chair positioned on a non-carpeted surface. Place your foot on a towel, keeping your heel on the floor. Pull the towel toward your heel by only curling your toes. Keep your heel on the floor. Repeat 3 times. Complete this exercise 2 times per day.  STRENGTH - Ankle Inversion Secure one end of a rubber exercise band/tubing to a fixed object (table, pole). Loop the other end around your foot just before your toes. Place your fists between your knees. This will focus your strengthening at your ankle. Slowly, pull your big toe up and in, making sure the band/tubing is positioned to resist the entire motion. Hold this position for 10 seconds. Have your muscles resist the band/tubing as it slowly pulls your foot back to the starting position. Repeat 3 times. Complete this exercises 2 times per day.  Document Released: 05/12/2005 Document Revised: 08/04/2011 Document Reviewed: 08/24/2008 [employer] Patient Information 2014 [employer].  

## 2020-04-17 NOTE — Progress Notes (Signed)
  Subjective:  Patient ID: Mckenzie Park, female    DOB: July 17, 1975,  MRN: [id number]  Chief Complaint  Patient presents with  . Foot Pain    Left foot pain directly in the heel and is moving up to the ankle. It is a cionstant throbbing pain and it is worse in the morning i will have to walk on my tiptoes for a few minutes before i can put pressure on my heel.     44 y.o. female presents with the above complaint. History confirmed with patient.  She works third shift at [employer] and this is made work very difficult for her.  Has it both in the plantar and posterior heel.  She previously had this on the right side and was treated with a cam boot and casting and it eventually went away took a long time.  Objective:  Physical Exam: warm, good capillary refill, no trophic changes or ulcerative lesions, normal DP and PT pulses and normal sensory exam. Left Foot: Pain on palpation of the plantar heel at the calcaneal insertion of the plantar fascia, mild pain on palpation of the posterior heel for the Achilles insertion  Radiographs: X-ray of the left foot: Plantar calcaneal enthesophyte, no posterior enthesophyte Assessment:   1. Foot pain, left   2. Plantar fasciitis of left foot   3. Gastrocnemius equinus of left lower extremity   4. Tendonitis, Achilles, left      Plan:  Patient was evaluated and treated and all questions answered.  Discussed the etiology and treatment options for plantar fasciitis and Achilles tendinitis including stretching, formal physical therapy, supportive shoegears such as a running shoe or sneaker, pre fabricated orthoses, injection therapy, and oral medications. We also discussed the role of surgical treatment of this for patients who do not improve after exhausting non-surgical treatment options.    -XR reviewed with patient -Educated patient on stretching and icing of the affected limb -Injection delivered to the plantar fascia of the left foot. -Rx for  meloxicam. Educated on use, risks and benefits of the medication -CAM boot dispensed -Referral for physical therapy sent for Achilles tendinitis and plantar fasciitis  After sterile prep with povidone-iodine solution and alcohol, the left heel was injected with 1 cc 2% xylocaine plain, 0.5cc 10 mg triamcinolone acetonide, and 4 mg dexamethasone was injected along  the plantar fascia at the insertion on the plantar calcaneus. The patient tolerated the procedure well without complication.   Return in about 1 month (around 05/17/2020).

## 2020-04-25 ENCOUNTER — Other Ambulatory Visit: Payer: Self-pay | Admitting: Podiatry

## 2020-04-25 DIAGNOSIS — M722 Plantar fascial fibromatosis: Secondary | ICD-10-CM

## 2020-05-11 ENCOUNTER — Ambulatory Visit: Payer: BC Managed Care – PPO | Admitting: Rehabilitative and Restorative Service Providers"

## 2020-05-15 ENCOUNTER — Encounter: Payer: Self-pay | Admitting: Podiatry

## 2020-05-15 ENCOUNTER — Other Ambulatory Visit: Payer: Self-pay

## 2020-05-15 ENCOUNTER — Ambulatory Visit (INDEPENDENT_AMBULATORY_CARE_PROVIDER_SITE_OTHER): Payer: BC Managed Care – PPO | Admitting: Podiatry

## 2020-05-15 DIAGNOSIS — M7662 Achilles tendinitis, left leg: Secondary | ICD-10-CM

## 2020-05-15 DIAGNOSIS — M722 Plantar fascial fibromatosis: Secondary | ICD-10-CM | POA: Diagnosis not present

## 2020-05-15 MED ORDER — MELOXICAM 15 MG PO TABS: 15.0000 mg | ORAL_TABLET | Freq: Every day | ORAL | 3 refills | Status: DC

## 2020-05-15 NOTE — Patient Instructions (Signed)

## 2020-05-18 NOTE — Progress Notes (Signed)
  Subjective:  Patient ID: Mckenzie Park, female    DOB: 1975-12-29,  MRN: [id number]  Chief Complaint  Patient presents with  . Plantar Fasciitis    F/U PF and achilles Pt states<" shot did helped some but it's still hurting all along the heel." - 3/10 occasional shooting, no swelling -worse with first steps Tx: boot, stretching and meloxicam ( completed)  - pt states she had to cancel PT due to mom passing away     44 y.o. female returns with the above complaint. History confirmed with patient.  Been wearing the boot at work some.  She has had a difficult time with the death of her mother and planning for funeral arrangements recently  Objective:  Physical Exam: warm, good capillary refill, no trophic changes or ulcerative lesions, normal DP and PT pulses and normal sensory exam. Left Foot: Pain on palpation of the plantar heel at the calcaneal insertion of the plantar fascia, mild pain on palpation of the posterior heel for the Achilles insertion  Radiographs: X-ray of the left foot: Plantar calcaneal enthesophyte, no posterior enthesophyte Assessment:   1. Plantar fasciitis of left foot   2. Achilles tendinitis, left leg      Plan:  Patient was evaluated and treated and all questions answered.  -Continue stretching and icing of the affected limb -Rx for refill of meloxicam. Educated on use, risks and benefits of the medication -Continue CAM boot -She will start physical therapy when able.  In the interim continue home stretching at home.   Return in about 1 month (around 06/15/2020).

## 2020-06-12 ENCOUNTER — Ambulatory Visit: Payer: BC Managed Care – PPO | Admitting: Podiatry

## 2021-01-30 DIAGNOSIS — M7652 Patellar tendinitis, left knee: Secondary | ICD-10-CM | POA: Diagnosis not present

## 2021-01-30 DIAGNOSIS — M25562 Pain in left knee: Secondary | ICD-10-CM | POA: Diagnosis not present

## 2021-02-01 ENCOUNTER — Other Ambulatory Visit: Payer: Self-pay | Admitting: Internal Medicine

## 2021-02-01 DIAGNOSIS — Z1231 Encounter for screening mammogram for malignant neoplasm of breast: Secondary | ICD-10-CM

## 2021-02-04 ENCOUNTER — Other Ambulatory Visit: Payer: Self-pay | Admitting: Internal Medicine

## 2021-02-04 DIAGNOSIS — N644 Mastodynia: Secondary | ICD-10-CM

## 2021-02-13 DIAGNOSIS — M25562 Pain in left knee: Secondary | ICD-10-CM | POA: Diagnosis not present

## 2021-02-20 DIAGNOSIS — M2392 Unspecified internal derangement of left knee: Secondary | ICD-10-CM | POA: Diagnosis not present

## 2021-02-22 ENCOUNTER — Other Ambulatory Visit: Payer: BC Managed Care – PPO

## 2021-03-12 ENCOUNTER — Ambulatory Visit: Payer: BC Managed Care – PPO

## 2021-03-12 ENCOUNTER — Ambulatory Visit
Admission: RE | Admit: 2021-03-12 | Discharge: 2021-03-12 | Disposition: A | Discharge status: Home / Self Care | Payer: BC Managed Care – PPO | Source: Ambulatory Visit | Attending: Internal Medicine | Admitting: Internal Medicine

## 2021-03-12 ENCOUNTER — Other Ambulatory Visit: Payer: Self-pay

## 2021-03-12 ENCOUNTER — Ambulatory Visit: Admission: RE | Admit: 2021-03-12 | Payer: BC Managed Care – PPO | Source: Ambulatory Visit

## 2021-03-12 DIAGNOSIS — N644 Mastodynia: Secondary | ICD-10-CM

## 2021-03-15 DIAGNOSIS — E559 Vitamin D deficiency, unspecified: Secondary | ICD-10-CM | POA: Diagnosis not present

## 2021-03-15 DIAGNOSIS — E785 Hyperlipidemia, unspecified: Secondary | ICD-10-CM | POA: Diagnosis not present

## 2021-03-20 DIAGNOSIS — M25562 Pain in left knee: Secondary | ICD-10-CM | POA: Diagnosis not present

## 2021-03-20 DIAGNOSIS — M2392 Unspecified internal derangement of left knee: Secondary | ICD-10-CM | POA: Diagnosis not present

## 2021-03-22 DIAGNOSIS — Z0000 Encounter for general adult medical examination without abnormal findings: Secondary | ICD-10-CM | POA: Diagnosis not present

## 2021-03-22 DIAGNOSIS — I10 Essential (primary) hypertension: Secondary | ICD-10-CM | POA: Diagnosis not present

## 2021-03-22 DIAGNOSIS — Z1331 Encounter for screening for depression: Secondary | ICD-10-CM | POA: Diagnosis not present

## 2021-03-22 DIAGNOSIS — Z1339 Encounter for screening examination for other mental health and behavioral disorders: Secondary | ICD-10-CM | POA: Diagnosis not present

## 2021-03-27 DIAGNOSIS — Z6841 Body Mass Index (BMI) 40.0 and over, adult: Secondary | ICD-10-CM | POA: Diagnosis not present

## 2021-03-27 DIAGNOSIS — S83242A Other tear of medial meniscus, current injury, left knee, initial encounter: Secondary | ICD-10-CM | POA: Insufficient documentation

## 2021-03-27 DIAGNOSIS — K219 Gastro-esophageal reflux disease without esophagitis: Secondary | ICD-10-CM | POA: Diagnosis not present

## 2021-03-27 DIAGNOSIS — Z79899 Other long term (current) drug therapy: Secondary | ICD-10-CM | POA: Diagnosis not present

## 2021-03-27 DIAGNOSIS — M1712 Unilateral primary osteoarthritis, left knee: Secondary | ICD-10-CM | POA: Diagnosis not present

## 2021-03-27 DIAGNOSIS — R001 Bradycardia, unspecified: Secondary | ICD-10-CM | POA: Diagnosis not present

## 2021-03-27 DIAGNOSIS — G8929 Other chronic pain: Secondary | ICD-10-CM | POA: Diagnosis not present

## 2021-03-27 DIAGNOSIS — M6752 Plica syndrome, left knee: Secondary | ICD-10-CM | POA: Insufficient documentation

## 2021-03-27 DIAGNOSIS — F419 Anxiety disorder, unspecified: Secondary | ICD-10-CM | POA: Diagnosis not present

## 2021-03-27 DIAGNOSIS — M238X2 Other internal derangements of left knee: Secondary | ICD-10-CM | POA: Diagnosis not present

## 2021-03-27 DIAGNOSIS — M7652 Patellar tendinitis, left knee: Secondary | ICD-10-CM | POA: Diagnosis not present

## 2021-03-27 DIAGNOSIS — I10 Essential (primary) hypertension: Secondary | ICD-10-CM | POA: Diagnosis not present

## 2021-03-27 DIAGNOSIS — X58XXXA Exposure to other specified factors, initial encounter: Secondary | ICD-10-CM | POA: Diagnosis not present

## 2021-03-27 DIAGNOSIS — M25562 Pain in left knee: Secondary | ICD-10-CM | POA: Diagnosis not present

## 2021-04-01 ENCOUNTER — Ambulatory Visit: Payer: BC Managed Care – PPO | Admitting: Cardiology

## 2021-04-01 ENCOUNTER — Other Ambulatory Visit: Payer: Self-pay

## 2021-04-01 ENCOUNTER — Encounter: Payer: Self-pay | Admitting: Cardiology

## 2021-04-01 VITALS — BP 173/96 | HR 54 | Temp 97.8°F | Resp 14 | Ht 66.0 in | Wt 265.5 lb

## 2021-04-01 DIAGNOSIS — E7800 Pure hypercholesterolemia, unspecified: Secondary | ICD-10-CM | POA: Diagnosis not present

## 2021-04-01 DIAGNOSIS — R002 Palpitations: Secondary | ICD-10-CM

## 2021-04-01 DIAGNOSIS — I10 Essential (primary) hypertension: Secondary | ICD-10-CM | POA: Diagnosis not present

## 2021-04-01 DIAGNOSIS — F411 Generalized anxiety disorder: Secondary | ICD-10-CM | POA: Diagnosis not present

## 2021-04-01 MED ORDER — PROPRANOLOL HCL 20 MG PO TABS: 20.0000 mg | ORAL_TABLET | Freq: Three times a day (TID) | ORAL | 1 refills | Status: DC

## 2021-04-01 MED ORDER — LOSARTAN POTASSIUM 100 MG PO TABS: 100.0000 mg | ORAL_TABLET | Freq: Every evening | ORAL | 3 refills | Status: DC

## 2021-04-01 NOTE — Progress Notes (Signed)
Primary Physician/Referring:  Velna Hatchet, MD  Patient ID: Mckenzie Park, female    DOB: 03-07-1976, 45 y.o.   MRN: [id number]  No chief complaint on file.  HPI:    Mckenzie Park  is a 45 y.o. Caucasian female with hypertension, morbid obesity, presents on a urgent basis for evaluation of chest pain, dyspnea and palpitations.  Her brother at age 48 recently died suddenly with aortic dissection.  In view of her family history of premature coronary artery disease, father having had aortic valve disease and aortic valve replacement, normal coronary arteries and calcium score of 0 by CTA of the coronaries on 11/11/2019 with no significant aortic abnormality.    She presents for annual visit, states that she has been having more frequent episodes of palpitations.  She has also noticed her blood pressure to be elevated.  Recently underwent left knee arthroscopy without any periprocedural complications.  She has not had any further chest pain, denies dyspnea or leg edema.  Past Medical History:  Diagnosis Date   Hypertension    Kidney stone    Past Surgical History:  Procedure Laterality Date   ABDOMINAL HYSTERECTOMY     CHOLECYSTECTOMY     TUBAL LIGATION     Social History   Tobacco Use   Smoking status: Never   Smokeless tobacco: Never  Substance Use Topics   Alcohol use: No    Marital Status: Married  ROS  Review of Systems  Cardiovascular:  Positive for palpitations. Negative for chest pain, dyspnea on exertion and leg swelling.  Objective   Vitals with BMI 04/01/2021 12/09/2019 11/11/2019  Height 5\' 6"  5\' 6"  -  Weight 265 lbs 8 oz 264 lbs -  BMI 32.99 24.26 -  Systolic 834 196 222  Diastolic 96 72 78  Pulse 54 48 47    Blood pressure (!) 173/96, pulse (!) 54, temperature 97.8 F (36.6 C), temperature source Temporal, resp. rate 14, height 5\' 6"  (1.676 m), weight 265 lb 8 oz (120.4 kg), SpO2 99 %. Body mass index is 42.85 kg/m.   Physical Exam Constitutional:       Comments: Morbidly obese in no acute distress.  Cardiovascular:     Rate and Rhythm: Normal rate and regular rhythm.     Pulses:          Carotid pulses are 2+ on the right side and 2+ on the left side.      Popliteal pulses are 2+ on the right side and 2+ on the left side.       Dorsalis pedis pulses are 2+ on the right side and 2+ on the left side.       Posterior tibial pulses are 2+ on the right side and 2+ on the left side.     Heart sounds: Normal heart sounds. No murmur heard.   No gallop.     Comments: No leg edema. JVD difficult to see due to short neck. Pulmonary:     Effort: Pulmonary effort is normal.     Breath sounds: Normal breath sounds.  Abdominal:     General: Bowel sounds are normal.     Palpations: Abdomen is soft.     Comments: Obese. Pannus present   Radiology: No results found.  Laboratory examination:    External labs:   Cholesterol, total 228.000 m 03/15/2021 HDL 43.000 mg 03/15/2021 LDL 168.000 m 03/15/2021 Triglycerides 87.000 mg 03/15/2021  Hemoglobin 13.700 g/d 02/04/2019  Creatinine, Serum 0.600 mg/ 11/09/2019 Potassium 4.000 mEq  03/15/2021 Magnesium N/D ALT (SGPT) 19.000 IU/ 03/15/2021  TSH 1.520 03/15/2021 Medications   Current Outpatient Medications  Medication Instructions   albuterol (VENTOLIN HFA) 108 (90 Base) MCG/ACT inhaler 2 puffs, Inhalation, Every 6 hours PRN   ALPRAZolam (XANAX) 0.25 MG tablet 1 tablet, Oral, 3 times daily, As needed for Panic /Anxiety   ALPRAZolam (XANAX) 0.5 mg, Oral, 3 times daily PRN   D-3-5 5,000 Units, Oral, Daily   escitalopram (LEXAPRO) 10 MG tablet 1 tablet, Oral, Daily   gabapentin (NEURONTIN) 100 MG capsule 1 capsule, Oral, 3 times daily   HYDROcodone-acetaminophen (NORCO/VICODIN) 5-325 MG tablet 1 tablet, Oral, As needed   losartan (COZAAR) 100 mg, Oral, Every evening   nitroGLYCERIN (NITROSTAT) 0.4 mg, Sublingual, Every 5 min PRN   propranolol (INDERAL) 20 mg, Oral, 3 times daily, Take extra  tablet if SBP > 150 or or anxiety    Cardiac Studies:   Treadmill stress test   08/07/2017: Indication: Chest pain The patient exercised on Bruce protocol for 09:12 min. Patient achieved 10.46 METS and reached HR 150 bpm, which is 84 % of maximum age-predicted HR. Stress test terminated due to light headed , SOB, tightness of chest and chest painExercise capacity was normal.  HR Response to Exercise: Appropriate. BP Response to Exercise: Normal resting BP- appropriate response. Chest Pain: limiting. Arrhythmias: none. ST Changes: With peak exercise there was no ST-T changes of ischemia.  Overall Impression: Normal stress test. Continue primary/secondary prevention.  Echocardiogram  [11/11/2016]: Left ventricle cavity is normal in size. Normal global wall motion. Normal diastolic filling pattern, normal LAP. Calculated EF 60%. Left atrial cavity is mildly dilated at 4.1 cm. Mild (Grade I) to at most mild to moderate posteriorly directed mitral regurgitation. Mild to moderate tricuspid regurgitation. No evidence of pulmonary hypertension. Compared to the study done on 04/11/2015, no significant change. PA pressure was previously measured at 35 mmHg.  Holter Monitor  [04/06/2015]: Patient's symptoms of dizziness correlated with sinus bradycardia at the rate of 47-49 bpm. Episodes of chest pain and shortness of breath and palpitations revealed normal sinus rhythm. Asymptomatic episodes of ventricular bigeminy, 3-4 beat runs of atrial tachycardia was evident. One episode off what appears to be brief episode off probable idioventricular rhythm (wide-complex rhythm at the rate of 81 bpm. 2 episodes lasted for less than 1 minute at 4:35 AM). Consider sleep apnea related rhythm disturbance.  Coronary CT morphology 11/11/2019: Coronary calcium score: The patient's coronary artery calcium score is 0, which places the patient in the 0 percentile. Coronary arteries: Normal coronary origins.  Right  dominance. No significant incidental noncardiac findings. Normal thoracic aorta.  EKG:  EKG 11/08/2019: Marked sinus bradycardia at rate of 48 bpm, normal axis, incomplete right bundle branch block.  No evidence of ischemia.  No significant change from 02/07/2019.  Assessment     ICD-10-CM   1. Palpitations  R00.2 EKG 12-Lead    propranolol (INDERAL) 20 MG tablet    2. Essential hypertension  I10 propranolol (INDERAL) 20 MG tablet    losartan (COZAAR) 100 MG tablet    Basic metabolic panel    3. Generalized anxiety disorder  F41.1 propranolol (INDERAL) 20 MG tablet    4. Pure hypercholesterolemia  E78.00        Recommendations:   Mckenzie Park  is a 45 y.o. Caucasian female with hypertension, morbid obesity, presents on a urgent basis for evaluation of chest pain, dyspnea and palpitations.  Her brother at age 33 recently died suddenly with  aortic dissection.  In view of her family history of premature coronary artery disease, father having had aortic valve disease and aortic valve replacement, normal coronary arteries and calcium score of 0 by CTA of the coronaries on 11/11/2019 with no significant aortic abnormality.    She presents for annual visit, states that she has been having more frequent episodes of palpitations.  She has also noticed her blood pressure to be elevated.  Recently underwent left knee arthroscopy without any periprocedural complications.  I reassured her with regard to aortic aneurysm, blood pressure is not well controlled probably related to her recent surgery and pain in her knee but also at home she has noticed elevated blood pressure, will increase her losartan to 100 mg daily and also add propranolol 20 mg p.o. 3 times daily both for anxiety and hypertension control.  Symptoms of palpitations suggest PACs and PVCs not atrial fibrillation.  Otherwise stable from cardiac standpoint, she is willing to start a statin in view of family history.  She is going to  make an appointment to see Dr. Clarice Pole in 2 months or so, hopefully propranolol will not cause any weight gain and will help both with anxiety, hypertension and palpitations..  Weight loss again discussed.  I will be happy to see her back on a as needed basis.   Adrian Prows, MD, [facility] 04/01/2021, 3:49 PM Office: [phone]

## 2021-04-10 DIAGNOSIS — I10 Essential (primary) hypertension: Secondary | ICD-10-CM | POA: Diagnosis not present

## 2021-04-11 LAB — BASIC METABOLIC PANEL
BUN/Creatinine Ratio: 15 (ref 9–23)
BUN: 9 mg/dL (ref 6–24)
CO2: 25 mmol/L (ref 20–29)
Calcium: 9.3 mg/dL (ref 8.7–10.2)
Chloride: 103 mmol/L (ref 96–106)
Creatinine, Ser: 0.59 mg/dL (ref 0.57–1.00)
Glucose: 90 mg/dL (ref 70–99)
Potassium: 4.5 mmol/L (ref 3.5–5.2)
Sodium: 141 mmol/L (ref 134–144)
eGFR: 113 mL/min/{1.73_m2} (ref >59)

## 2021-04-12 DIAGNOSIS — R262 Difficulty in walking, not elsewhere classified: Secondary | ICD-10-CM | POA: Diagnosis not present

## 2021-04-12 DIAGNOSIS — M6281 Muscle weakness (generalized): Secondary | ICD-10-CM | POA: Diagnosis not present

## 2021-04-12 DIAGNOSIS — M25562 Pain in left knee: Secondary | ICD-10-CM | POA: Diagnosis not present

## 2021-04-12 DIAGNOSIS — M25662 Stiffness of left knee, not elsewhere classified: Secondary | ICD-10-CM | POA: Diagnosis not present

## 2021-04-15 DIAGNOSIS — M25562 Pain in left knee: Secondary | ICD-10-CM | POA: Diagnosis not present

## 2021-04-15 DIAGNOSIS — R262 Difficulty in walking, not elsewhere classified: Secondary | ICD-10-CM | POA: Diagnosis not present

## 2021-04-15 DIAGNOSIS — M25662 Stiffness of left knee, not elsewhere classified: Secondary | ICD-10-CM | POA: Diagnosis not present

## 2021-04-15 DIAGNOSIS — M6281 Muscle weakness (generalized): Secondary | ICD-10-CM | POA: Diagnosis not present

## 2021-04-22 DIAGNOSIS — M25662 Stiffness of left knee, not elsewhere classified: Secondary | ICD-10-CM | POA: Diagnosis not present

## 2021-04-22 DIAGNOSIS — R262 Difficulty in walking, not elsewhere classified: Secondary | ICD-10-CM | POA: Diagnosis not present

## 2021-04-22 DIAGNOSIS — M6281 Muscle weakness (generalized): Secondary | ICD-10-CM | POA: Diagnosis not present

## 2021-04-22 DIAGNOSIS — M25562 Pain in left knee: Secondary | ICD-10-CM | POA: Diagnosis not present

## 2021-04-24 DIAGNOSIS — M25562 Pain in left knee: Secondary | ICD-10-CM | POA: Diagnosis not present

## 2021-04-24 DIAGNOSIS — M6281 Muscle weakness (generalized): Secondary | ICD-10-CM | POA: Diagnosis not present

## 2021-04-24 DIAGNOSIS — R262 Difficulty in walking, not elsewhere classified: Secondary | ICD-10-CM | POA: Diagnosis not present

## 2021-04-24 DIAGNOSIS — M25662 Stiffness of left knee, not elsewhere classified: Secondary | ICD-10-CM | POA: Diagnosis not present

## 2021-05-06 DIAGNOSIS — M25662 Stiffness of left knee, not elsewhere classified: Secondary | ICD-10-CM | POA: Diagnosis not present

## 2021-05-06 DIAGNOSIS — M25562 Pain in left knee: Secondary | ICD-10-CM | POA: Diagnosis not present

## 2021-05-06 DIAGNOSIS — M6281 Muscle weakness (generalized): Secondary | ICD-10-CM | POA: Diagnosis not present

## 2021-05-06 DIAGNOSIS — R262 Difficulty in walking, not elsewhere classified: Secondary | ICD-10-CM | POA: Diagnosis not present

## 2021-05-08 DIAGNOSIS — M6281 Muscle weakness (generalized): Secondary | ICD-10-CM | POA: Diagnosis not present

## 2021-05-08 DIAGNOSIS — M25662 Stiffness of left knee, not elsewhere classified: Secondary | ICD-10-CM | POA: Diagnosis not present

## 2021-05-08 DIAGNOSIS — M25562 Pain in left knee: Secondary | ICD-10-CM | POA: Diagnosis not present

## 2021-05-08 DIAGNOSIS — R262 Difficulty in walking, not elsewhere classified: Secondary | ICD-10-CM | POA: Diagnosis not present

## 2021-05-10 DIAGNOSIS — M25662 Stiffness of left knee, not elsewhere classified: Secondary | ICD-10-CM | POA: Diagnosis not present

## 2021-05-10 DIAGNOSIS — R262 Difficulty in walking, not elsewhere classified: Secondary | ICD-10-CM | POA: Diagnosis not present

## 2021-05-10 DIAGNOSIS — M25562 Pain in left knee: Secondary | ICD-10-CM | POA: Diagnosis not present

## 2021-05-10 DIAGNOSIS — M6281 Muscle weakness (generalized): Secondary | ICD-10-CM | POA: Diagnosis not present

## 2021-05-13 DIAGNOSIS — M25562 Pain in left knee: Secondary | ICD-10-CM | POA: Diagnosis not present

## 2021-05-13 DIAGNOSIS — M25662 Stiffness of left knee, not elsewhere classified: Secondary | ICD-10-CM | POA: Diagnosis not present

## 2021-05-13 DIAGNOSIS — M6281 Muscle weakness (generalized): Secondary | ICD-10-CM | POA: Diagnosis not present

## 2021-05-13 DIAGNOSIS — R262 Difficulty in walking, not elsewhere classified: Secondary | ICD-10-CM | POA: Diagnosis not present

## 2021-05-14 NOTE — Progress Notes (Signed)
Office Visit Note  Patient: Mckenzie Park             Date of Birth: [birth year]           MRN: [id number]             PCP: Velna Hatchet, MD Referring: Velna Hatchet, MD Visit Date: 05/28/2021 Occupation: @GUAROCC @  Subjective:  Pain in multiple joints.   History of Present Illness: Mckenzie Park is a 45 y.o. female seen in consultation per request of her PCP.  According to the patient the last few years have been very difficult for her.  She lost her father in January 2020.  She states she started experiencing increased pain in her joints while she was taking care of her father and the pain got worse over time.  She also was a caregiver to her mother who passed in December 2021.  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  She works third shift.  She has chronic insomnia due to working third shift.  She states she injured her right knee joint in 2021 and underwent meniscal tear repair in August 2021 by Dr. Beather Arbour in [place].  As she was favoring her right knee joint her left knee joint started hurting and she was diagnosed with left knee joint meniscal tear.  She underwent left meniscal tear repair in November 2022.  She is still out of work due to ongoing pain and discomfort in her knee joints.  She states the last time Dr. Beather Arbour examined her he suspected fibromyalgia she had several tender points.  There is family history of fibromyalgia.  She states she has lost 5 family members in the last 2 years and did not have time to grieve.  She continues to have a lot of pain and discomfort.  She describes discomfort in her trapezius region, lower back, bilateral shoulders, right elbow, both wrists, both hands, both hips, knees, feet and her ankles.  She states she was seen by podiatrist about 3 years ago who placed her in a boot for the right ankle joint pain and then she was in a cast.  She was also given a boot for her left ankle.  The symptoms gradually  improved but she still have pain and discomfort in her ankles and her feet.  She has seen some swelling in her hands and her ankles.  Is been experiencing some chest pain.  And has an appointment coming up with Dr. Einar Gip.  She is gravida 5, para 4, miscarriage 1.  There is family history of fibromyalgia, osteoarthritis and gout.  There is no history of DVTs.  Activities of Daily Living:  Patient reports morning stiffness for 30-45 minutes.   Patient Reports nocturnal pain.  Difficulty dressing/grooming: Denies Difficulty climbing stairs: Reports Difficulty getting out of chair: Reports Difficulty using hands for taps, buttons, cutlery, and/or writing: Reports  Review of Systems  Constitutional:  Positive for fatigue.  HENT:  Positive for mouth dryness and nose dryness. Negative for mouth sores.   Eyes:  Positive for dryness. Negative for pain and itching.  Respiratory:  Negative for shortness of breath and difficulty breathing.   Cardiovascular:  Positive for chest pain and palpitations.  Gastrointestinal:  Negative for blood in stool, constipation and diarrhea.  Endocrine: Negative for increased urination.  Genitourinary:  Positive for difficulty urinating.  Musculoskeletal:  Positive for joint pain, joint pain, joint swelling, myalgias, morning stiffness, muscle tenderness and myalgias.  Skin:  Negative for color change, rash, redness and sensitivity to sunlight.  Allergic/Immunologic: Negative for susceptible to infections.  Neurological:  Positive for numbness, headaches and weakness. Negative for dizziness and memory loss.  Hematological:  Positive for bruising/bleeding tendency. Negative for swollen glands.  Psychiatric/Behavioral:  Positive for depressed mood and sleep disturbance. Negative for confusion. The patient is nervous/anxious.    PMFS History:  Patient Active Problem List   Diagnosis Date Noted   Chronic migraine without aura without status migrainosus, not intractable  08/01/2015   Essential hypertension 08/01/2015   Depression 08/01/2015    Past Medical History:  Diagnosis Date   Hypertension    Kidney stone     Family History  Problem Relation Age of Onset   Hypertension Mother    Heart disease Mother    COPD Mother    Parkinsonism Father    Hypertension Brother    AAA (abdominal aortic aneurysm) Brother 36   Asthma Son    Past Surgical History:  Procedure Laterality Date   ABDOMINAL HYSTERECTOMY     CHOLECYSTECTOMY     kidney stone removal     KNEE ARTHROSCOPY Bilateral    TUBAL LIGATION     Social History   Social History Narrative   Not on file   Immunization History  Administered Date(s) Administered   Influenza-Unspecified 01/31/2016     Objective: Vital Signs: BP 106/64 (BP Location: Right Arm, Patient Position: Sitting, Cuff Size: Large)    Pulse 60    Ht 5\' 6"  (1.676 m)    Wt 270 lb 3.2 oz (122.6 kg)    BMI 43.61 kg/m    Physical Exam Vitals and nursing note reviewed.  Constitutional:      Appearance: She is well-developed.  HENT:     Head: Normocephalic and atraumatic.  Eyes:     Conjunctiva/sclera: Conjunctivae normal.  Cardiovascular:     Rate and Rhythm: Normal rate and regular rhythm.     Heart sounds: Normal heart sounds.  Pulmonary:     Effort: Pulmonary effort is normal.     Breath sounds: Normal breath sounds.  Abdominal:     General: Bowel sounds are normal.     Palpations: Abdomen is soft.  Musculoskeletal:     Cervical back: Normal range of motion.  Lymphadenopathy:     Cervical: No cervical adenopathy.  Skin:    General: Skin is warm and dry.     Capillary Refill: Capillary refill takes less than 2 seconds.  Neurological:     Mental Status: She is alert and oriented to person, place, and time.  Psychiatric:        Behavior: Behavior normal.     Musculoskeletal Exam: C-spine thoracic and lumbar spine were in good range of motion.  She had bilateral trapezius spasm.  She had tenderness and  discomfort range of motion of lumbar spine.  Shoulder joints, elbow joints, wrist joints, MCPs PIPs and DIPs with good range of motion with no synovitis.  She had tenderness over bilateral shoulders, right lateral epicondyle region.  She also had tenderness across her MCPs and PIPs.  She had tenderness over bilateral trochanteric bursa.  She had painful range of motion of bilateral hip joints.  She had discomfort in the bilateral knee joints.  Right knee joint had no warmth swelling or effusion.  Left knee joint had recent scars from arthroscopic surgery and warm to touch.  There was no over Achilles tendon or plantar fascia.  She had tenderness across MTPs.  CDAI Exam: CDAI Score: -- Patient Global: --; Provider Global: -- Swollen: --; Tender: -- Joint Exam 05/28/2021   No joint exam has been documented for this visit   There is currently no information documented on the homunculus. Go to the Rheumatology activity and complete the homunculus joint exam.  Investigation: No additional findings.  Imaging: No results found.  Recent Labs: Lab Results  Component Value Date   WBC 8.3 09/08/2016   HGB 13.4 09/08/2016   PLT 293 09/08/2016   NA 141 04/10/2021   K 4.5 04/10/2021   CL 103 04/10/2021   CO2 25 04/10/2021   GLUCOSE 90 04/10/2021   BUN 9 04/10/2021   CREATININE 0.59 04/10/2021   CALCIUM 9.3 04/10/2021   GFRAA 128 11/09/2019    Speciality Comments: No specialty comments available.  Procedures:  No procedures performed Allergies: Patient has no known allergies.   Assessment / Plan:     Visit Diagnoses: Polyarthralgia-she complains of pain and discomfort in multiple joints over the last few years.  She has been under a lot of stress over the last 2 to 3 years.  She has lost 5 family members including her father, brother and her mother.  Pain in both hands -she complains of pain and discomfort in the bilateral hands.  No synovitis was noted.  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  A handout on hand exercises was given.  Plan: XR Hand 2 View Right, XR Hand 2 View Left, x-rays were consistent with osteoarthritis.  Sedimentation rate, Rheumatoid factor, Cyclic citrul peptide antibody, IgG, ANA, Uric acid  Chronic pain of both hips -she complains of pain and discomfort in her bilateral hip joints and had discomfort range of motion.  Plan: XR HIPS BILAT W OR W/O PELVIS 3-4 VIEWS.  X-rays of bilateral hip joints were unremarkable.  Trochanteric bursitis of both hips-she had tenderness over bilateral trochanteric bursa consistent with trochanteric bursitis.  A handout on IT band stretches was given.  I also offered physical therapy which she declined.  Chronic pain of both knees-she complains of pain and discomfort in bilateral knee joints.  She had right knee joint arthroscopic surgery couple of years ago due to a work-related injury.  She had left knee joint meniscal tear repair in November 2022.  She is gradually recovering from it.  She still has some warmth on palpation of her left knee joint.  She is followed by an orthopedic surgeon in [place].  Pain in both feet -patient gives history of pain and discomfort in her ankles and her feet for the last 3 years.  She was under care of a podiatrist and she was in a boot in a cast for some time.  No warmth swelling or effusion was noted on the examination.  She has some tenderness across her MTPs.  She also had x-ray of her right foot on April 25, 2020 which was reviewed.  It was consistent with osteoarthritis and an inferior calcaneal spur was noted.  Plan: XR Foot 2 Views Left.  X-ray showed osteoarthritic changes and inferior calcaneal spurs.  Chronic midline low back pain without sciatica -she complains of pain and discomfort in her lower back.  She had discomfort on palpation of her lumbar spine.  Plan: XR Lumbar Spine 2-3 Views.  Anterior spurring with no significant disc space  narrowing was noted.  L4-L5 facet joint arthropathy was noted.  Myalgia -she complains of generalized myalgias.  She had multiple tender points including bilateral trapezius region tenderness over deltoid over right lateral epicondyle region over the gluteal region medial aspect of her knees.  There is a strong family history of fibromyalgia.  She works third shift and does not sleep well.  Detailed counsel regarding fibromyalgia syndrome was provided.  Need for regular exercise and good sleep hygiene was discussed.  Plan: CK  Other insomnia-she works third shift.  Good sleep hygiene was discussed.  Other fatigue -most likely related to insomnia.  Plan: CBC with Differential/Platelet, COMPLETE METABOLIC PANEL WITH GFR, TSH, Serum protein electrophoresis with reflex  Vitamin D deficiency - VitD 23 on 03/15/21.  She is on vitamin D 5000 units daily currently.  Essential hypertension-her blood pressure was normal today.  She is on Cozaar and Inderal.  Chronic migraine without aura without status migrainosus, not intractable  History of hyperlipidemia  Anxiety and depression-she has history of anxiety and depression and also situational depression due to losing several family members recently.  History of kidney stones-patient states that she had a kidney stone extraction when she was in her 63s.  Family history of fibromyalgia - mother,cousin and neice  History of gout -according the patient her sister is in her 27s and has gout.  Orders: Orders Placed This Encounter  Procedures   XR Hand 2 View Right   XR Hand 2 View Left   XR HIPS BILAT W OR W/O PELVIS 3-4 VIEWS   XR Foot 2 Views Left   XR Lumbar Spine 2-3 Views   CBC with Differential/Platelet   COMPLETE METABOLIC PANEL WITH GFR   Sedimentation rate   CK   TSH   Rheumatoid factor   Cyclic citrul peptide antibody, IgG   ANA   Uric acid   Serum protein electrophoresis with reflex   No orders of the defined types were placed  in this encounter.    Follow-Up Instructions: Return for Polyarthralgia, myalgia.   Bo Merino, MD  Note - This record has been created using Editor, commissioning.  Chart creation errors have been sought, but may not always  have been located. Such creation errors do not reflect on  the standard of medical care.

## 2021-05-17 DIAGNOSIS — M25562 Pain in left knee: Secondary | ICD-10-CM | POA: Diagnosis not present

## 2021-05-17 DIAGNOSIS — M6281 Muscle weakness (generalized): Secondary | ICD-10-CM | POA: Diagnosis not present

## 2021-05-17 DIAGNOSIS — M25662 Stiffness of left knee, not elsewhere classified: Secondary | ICD-10-CM | POA: Diagnosis not present

## 2021-05-17 DIAGNOSIS — R262 Difficulty in walking, not elsewhere classified: Secondary | ICD-10-CM | POA: Diagnosis not present

## 2021-05-22 DIAGNOSIS — M6281 Muscle weakness (generalized): Secondary | ICD-10-CM | POA: Diagnosis not present

## 2021-05-22 DIAGNOSIS — R262 Difficulty in walking, not elsewhere classified: Secondary | ICD-10-CM | POA: Diagnosis not present

## 2021-05-22 DIAGNOSIS — M25562 Pain in left knee: Secondary | ICD-10-CM | POA: Diagnosis not present

## 2021-05-22 DIAGNOSIS — M25662 Stiffness of left knee, not elsewhere classified: Secondary | ICD-10-CM | POA: Diagnosis not present

## 2021-05-28 ENCOUNTER — Encounter: Payer: Self-pay | Admitting: Rheumatology

## 2021-05-28 ENCOUNTER — Ambulatory Visit: Payer: Self-pay

## 2021-05-28 ENCOUNTER — Other Ambulatory Visit: Payer: Self-pay

## 2021-05-28 ENCOUNTER — Ambulatory Visit: Payer: BC Managed Care – PPO | Admitting: Rheumatology

## 2021-05-28 VITALS — BP 106/64 | HR 60 | Ht 66.0 in | Wt 270.2 lb

## 2021-05-28 DIAGNOSIS — Z8639 Personal history of other endocrine, nutritional and metabolic disease: Secondary | ICD-10-CM

## 2021-05-28 DIAGNOSIS — G4709 Other insomnia: Secondary | ICD-10-CM

## 2021-05-28 DIAGNOSIS — E559 Vitamin D deficiency, unspecified: Secondary | ICD-10-CM

## 2021-05-28 DIAGNOSIS — M7061 Trochanteric bursitis, right hip: Secondary | ICD-10-CM

## 2021-05-28 DIAGNOSIS — M79672 Pain in left foot: Secondary | ICD-10-CM

## 2021-05-28 DIAGNOSIS — M7062 Trochanteric bursitis, left hip: Secondary | ICD-10-CM

## 2021-05-28 DIAGNOSIS — R5383 Other fatigue: Secondary | ICD-10-CM

## 2021-05-28 DIAGNOSIS — M25561 Pain in right knee: Secondary | ICD-10-CM

## 2021-05-28 DIAGNOSIS — G8929 Other chronic pain: Secondary | ICD-10-CM | POA: Diagnosis not present

## 2021-05-28 DIAGNOSIS — M791 Myalgia, unspecified site: Secondary | ICD-10-CM | POA: Diagnosis not present

## 2021-05-28 DIAGNOSIS — M79642 Pain in left hand: Secondary | ICD-10-CM

## 2021-05-28 DIAGNOSIS — M25552 Pain in left hip: Secondary | ICD-10-CM | POA: Diagnosis not present

## 2021-05-28 DIAGNOSIS — M25562 Pain in left knee: Secondary | ICD-10-CM | POA: Diagnosis not present

## 2021-05-28 DIAGNOSIS — M25551 Pain in right hip: Secondary | ICD-10-CM

## 2021-05-28 DIAGNOSIS — Z8269 Family history of other diseases of the musculoskeletal system and connective tissue: Secondary | ICD-10-CM

## 2021-05-28 DIAGNOSIS — I10 Essential (primary) hypertension: Secondary | ICD-10-CM

## 2021-05-28 DIAGNOSIS — Z87442 Personal history of urinary calculi: Secondary | ICD-10-CM

## 2021-05-28 DIAGNOSIS — Z8739 Personal history of other diseases of the musculoskeletal system and connective tissue: Secondary | ICD-10-CM

## 2021-05-28 DIAGNOSIS — M79641 Pain in right hand: Secondary | ICD-10-CM | POA: Diagnosis not present

## 2021-05-28 DIAGNOSIS — M2550 Pain in unspecified joint: Secondary | ICD-10-CM

## 2021-05-28 DIAGNOSIS — M545 Low back pain, unspecified: Secondary | ICD-10-CM

## 2021-05-28 DIAGNOSIS — R262 Difficulty in walking, not elsewhere classified: Secondary | ICD-10-CM | POA: Diagnosis not present

## 2021-05-28 DIAGNOSIS — F32A Depression, unspecified: Secondary | ICD-10-CM

## 2021-05-28 DIAGNOSIS — M79671 Pain in right foot: Secondary | ICD-10-CM

## 2021-05-28 DIAGNOSIS — G43709 Chronic migraine without aura, not intractable, without status migrainosus: Secondary | ICD-10-CM

## 2021-05-28 DIAGNOSIS — M25662 Stiffness of left knee, not elsewhere classified: Secondary | ICD-10-CM | POA: Diagnosis not present

## 2021-05-28 DIAGNOSIS — M6281 Muscle weakness (generalized): Secondary | ICD-10-CM | POA: Diagnosis not present

## 2021-05-28 DIAGNOSIS — F419 Anxiety disorder, unspecified: Secondary | ICD-10-CM

## 2021-05-28 NOTE — Patient Instructions (Signed)
Back Exercises The following exercises strengthen the muscles that help to support the trunk (torso) and back. They also help to keep the lower back flexible. Doing these exercises can help to prevent or lessen existing low back pain. If you have back pain or discomfort, try doing these exercises 2-3 times each day or as told by your health care provider. As your pain improves, do them once each day, but increase the number of times that you repeat the steps for each exercise (do more repetitions). To prevent the recurrence of back pain, continue to do these exercises once each day or as told by your health care provider. Do exercises exactly as told by your health care provider and adjust them as directed. It is normal to feel mild stretching, pulling, tightness, or discomfort as you do these exercises, but you should stop right away if you feel sudden pain or your pain gets worse. Exercises Single knee to chest Repeat these steps 3-5 times for each leg: Lie on your back on a firm bed or the floor with your legs extended. Bring one knee to your chest. Your other leg should stay extended and in contact with the floor. Hold your knee in place by grabbing your knee or thigh with both hands and hold. Pull on your knee until you feel a gentle stretch in your lower back or buttocks. Hold the stretch for 10-30 seconds. Slowly release and straighten your leg.  Pelvic tilt Repeat these steps 5-10 times: Lie on your back on a firm bed or the floor with your legs extended. Bend your knees so they are pointing toward the ceiling and your feet are flat on the floor. Tighten your lower abdominal muscles to press your lower back against the floor. This motion will tilt your pelvis so your tailbone points up toward the ceiling instead of pointing to your feet or the floor. With gentle tension and even breathing, hold this position for 5-10 seconds.  Cat-cow Repeat these steps until your lower back becomes  more flexible: Get into a hands-and-knees position on a firm bed or the floor. Keep your hands under your shoulders, and keep your knees under your hips. You may place padding under your knees for comfort. Let your head hang down toward your chest. Contract your abdominal muscles and point your tailbone toward the floor so your lower back becomes rounded like the back of a cat. Hold this position for 5 seconds. Slowly lift your head, let your abdominal muscles relax, and point your tailbone up toward the ceiling so your back forms a sagging arch like the back of a cow. Hold this position for 5 seconds.  Press-ups Repeat these steps 5-10 times: Lie on your abdomen (face-down) on a firm bed or the floor. Place your palms near your head, about shoulder-width apart. Keeping your back as relaxed as possible and keeping your hips on the floor, slowly straighten your arms to raise the top half of your body and lift your shoulders. Do not use your back muscles to raise your upper torso. You may adjust the placement of your hands to make yourself more comfortable. Hold this position for 5 seconds while you keep your back relaxed. Slowly return to lying flat on the floor.  Bridges Repeat these steps 10 times: Lie on your back on a firm bed or the floor. Bend your knees so they are pointing toward the ceiling and your feet are flat on the floor. Your arms should be flat  at your sides, next to your body. Tighten your buttocks muscles and lift your buttocks off the floor until your waist is at almost the same height as your knees. You should feel the muscles working in your buttocks and the back of your thighs. If you do not feel these muscles, slide your feet 1-2 inches (2.5-5 cm) farther away from your buttocks. Hold this position for 3-5 seconds. Slowly lower your hips to the starting position, and allow your buttocks muscles to relax completely. If this exercise is too easy, try doing it with your arms  crossed over your chest. Abdominal crunches Repeat these steps 5-10 times: Lie on your back on a firm bed or the floor with your legs extended. Bend your knees so they are pointing toward the ceiling and your feet are flat on the floor. Cross your arms over your chest. Tip your chin slightly toward your chest without bending your neck. Tighten your abdominal muscles and slowly raise your torso high enough to lift your shoulder blades a tiny bit off the floor. Avoid raising your torso higher than that because it can put too much stress on your lower back and does not help to strengthen your abdominal muscles. Slowly return to your starting position.  Back lifts Repeat these steps 5-10 times: Lie on your abdomen (face-down) with your arms at your sides, and rest your forehead on the floor. Tighten the muscles in your legs and your buttocks. Slowly lift your chest off the floor while you keep your hips pressed to the floor. Keep the back of your head in line with the curve in your back. Your eyes should be looking at the floor. Hold this position for 3-5 seconds. Slowly return to your starting position.  Contact a health care provider if: Your back pain or discomfort gets much worse when you do an exercise. Your worsening back pain or discomfort does not lessen within 2 hours after you exercise. If you have any of these problems, stop doing these exercises right away. Do not do them again unless your health care provider says that you can. Get help right away if: You develop sudden, severe back pain. If this happens, stop doing the exercises right away. Do not do them again unless your health care provider says that you can. This information is not intended to replace advice given to you by your health care provider. Make sure you discuss any questions you have with your health care provider. Iliotibial Band Syndrome Rehab Ask your health care provider which exercises are safe for you. Do  exercises exactly as told by your health care provider and adjust them as directed. It is normal to feel mild stretching, pulling, tightness, or discomfort as you do these exercises. Stop right away if you feel sudden pain or your pain gets significantly worse. Do not begin these exercises until told by your health care provider. Stretching and range-of-motion exercises These exercises warm up your muscles and joints and improve the movement and flexibility of your hip and pelvis. Quadriceps stretch, prone  Lie on your abdomen (prone position) on a firm surface, such as a bed or padded floor. Bend your left / right knee and reach back to hold your ankle or pant leg. If you cannot reach your ankle or pant leg, loop a belt around your foot and grab the belt instead. Gently pull your heel toward your buttocks. Your knee should not slide out to the side. You should feel a stretch in  the front of your thigh and knee (quadriceps). Hold this position for __________ seconds. Repeat __________ times. Complete this exercise __________ times a day. Iliotibial band stretch An iliotibial band is a strong band of muscle tissue that runs from the outer side of your hip to the outer side of your thigh and knee. Lie on your side with your left / right leg in the top position. Bend both of your knees and grab your left / right ankle. Stretch out your bottom arm to help you balance. Slowly bring your top knee back so your thigh goes behind your trunk. Slowly lower your top leg toward the floor until you feel a gentle stretch on the outside of your left / right hip and thigh. If you do not feel a stretch and your knee will not fall farther, place the heel of your other foot on top of your knee and pull your knee down toward the floor with your foot. Hold this position for __________ seconds. Repeat __________ times. Complete this exercise __________ times a day. Strengthening exercises These exercises build strength  and endurance in your hip and pelvis. Endurance is the ability to use your muscles for a long time, even after they get tired. Straight leg raises, side-lying This exercise strengthens the muscles that rotate the leg at the hip and move it away from your body (hip abductors). Lie on your side with your left / right leg in the top position. Lie so your head, shoulder, hip, and knee line up. You may bend your bottom knee to help you balance. Roll your hips slightly forward so your hips are stacked directly over each other and your left / right knee is facing forward. Tense the muscles in your outer thigh and lift your top leg 4-6 inches (10-15 cm). Hold this position for __________ seconds. Slowly lower your leg to return to the starting position. Let your muscles relax completely before doing another repetition. Repeat __________ times. Complete this exercise __________ times a day. Leg raises, prone This exercise strengthens the muscles that move the hips backward (hip extensors). Lie on your abdomen (prone position) on your bed or a firm surface. You can put a pillow under your hips if that is more comfortable for your lower back. Bend your left / right knee so your foot is straight up in the air. Squeeze your buttocks muscles and lift your left / right thigh off the bed. Do not let your back arch. Tense your thigh muscle as hard as you can without increasing any knee pain. Hold this position for __________ seconds. Slowly lower your leg to return to the starting position and allow it to relax completely. Repeat __________ times. Complete this exercise __________ times a day. Hip hike Stand sideways on a bottom step. Stand on your left / right leg with your other foot unsupported next to the step. You can hold on to a railing or wall for balance if needed. Keep your knees straight and your torso square. Then lift your left / right hip up toward the ceiling. Slowly let your left / right hip  lower toward the floor, past the starting position. Your foot should get closer to the floor. Do not lean or bend your knees. Repeat __________ times. Complete this exercise __________ times a day. This information is not intended to replace advice given to you by your health care provider. Make sure you discuss any questions you have with your health care provider. Document Revised: 07/20/2019  Document Reviewed: 07/20/2019 [employer] Patient Education  [place] Exercises Hand exercises can be helpful for almost anyone. These exercises can strengthen the hands, improve flexibility and movement, and increase blood flow to the hands. These results can make work and daily tasks easier. Hand exercises can be especially helpful for people who have joint pain from arthritis or have nerve damage from overuse (carpal tunnel syndrome). These exercises can also help people who have injured a hand. Exercises Most of these hand exercises are gentle stretching and motion exercises. It is usually safe to do them often throughout the day. Warming up your hands before exercise may help to reduce stiffness. You can do this with gentle massage or by placing your hands in warm water for 10-15 minutes. It is normal to feel some stretching, pulling, tightness, or mild discomfort as you begin new exercises. This will gradually improve. Stop an exercise right away if you feel sudden, severe pain or your pain gets worse. Ask your health care provider which exercises are best for you. Knuckle bend or "claw" fist  Stand or sit with your arm, hand, and all five fingers pointed straight up. Make sure to keep your wrist straight during the exercise. Gently bend your fingers down toward your palm until the tips of your fingers are touching the top of your palm. Keep your big knuckle straight and just bend the small knuckles in your fingers. Hold this position for __________ seconds. Straighten (extend) your fingers  back to the starting position. Repeat this exercise 5-10 times with each hand. Full finger fist  Stand or sit with your arm, hand, and all five fingers pointed straight up. Make sure to keep your wrist straight during the exercise. Gently bend your fingers into your palm until the tips of your fingers are touching the middle of your palm. Hold this position for __________ seconds. Extend your fingers back to the starting position, stretching every joint fully. Repeat this exercise 5-10 times with each hand. Straight fist Stand or sit with your arm, hand, and all five fingers pointed straight up. Make sure to keep your wrist straight during the exercise. Gently bend your fingers at the big knuckle, where your fingers meet your hand, and the middle knuckle. Keep the knuckle at the tips of your fingers straight and try to touch the bottom of your palm. Hold this position for __________ seconds. Extend your fingers back to the starting position, stretching every joint fully. Repeat this exercise 5-10 times with each hand. Tabletop  Stand or sit with your arm, hand, and all five fingers pointed straight up. Make sure to keep your wrist straight during the exercise. Gently bend your fingers at the big knuckle, where your fingers meet your hand, as far down as you can while keeping the small knuckles in your fingers straight. Think of forming a tabletop with your fingers. Hold this position for __________ seconds. Extend your fingers back to the starting position, stretching every joint fully. Repeat this exercise 5-10 times with each hand. Finger spread  Place your hand flat on a table with your palm facing down. Make sure your wrist stays straight as you do this exercise. Spread your fingers and thumb apart from each other as far as you can until you feel a gentle stretch. Hold this position for __________ seconds. Bring your fingers and thumb tight together again. Hold this position for  __________ seconds. Repeat this exercise 5-10 times with each hand. Making circles  Stand  or sit with your arm, hand, and all five fingers pointed straight up. Make sure to keep your wrist straight during the exercise. Make a circle by touching the tip of your thumb to the tip of your index finger. Hold for __________ seconds. Then open your hand wide. Repeat this motion with your thumb and each finger on your hand. Repeat this exercise 5-10 times with each hand. Thumb motion  Sit with your forearm resting on a table and your wrist straight. Your thumb should be facing up toward the ceiling. Keep your fingers relaxed as you move your thumb. Lift your thumb up as high as you can toward the ceiling. Hold for __________ seconds. Bend your thumb across your palm as far as you can, reaching the tip of your thumb for the small finger (pinkie) side of your palm. Hold for __________ seconds. Repeat this exercise 5-10 times with each hand. Grip strengthening  Hold a stress ball or other soft ball in the middle of your hand. Slowly increase the pressure, squeezing the ball as much as you can without causing pain. Think of bringing the tips of your fingers into the middle of your palm. All of your finger joints should bend when doing this exercise. Hold your squeeze for __________ seconds, then relax. Repeat this exercise 5-10 times with each hand. Contact a health care provider if: Your hand pain or discomfort gets much worse when you do an exercise. Your hand pain or discomfort does not improve within 2 hours after you exercise. If you have any of these problems, stop doing these exercises right away. Do not do them again unless your health care provider says that you can. Get help right away if: You develop sudden, severe hand pain or swelling. If this happens, stop doing these exercises right away. Do not do them again unless your health care provider says that you can. This information is not  intended to replace advice given to you by your health care provider. Make sure you discuss any questions you have with your health care provider. Document Revised: 08/30/2020 Document Reviewed: 08/30/2020 Else[employer] Patient Education  [place].

## 2021-05-30 DIAGNOSIS — M25562 Pain in left knee: Secondary | ICD-10-CM | POA: Diagnosis not present

## 2021-05-30 DIAGNOSIS — R262 Difficulty in walking, not elsewhere classified: Secondary | ICD-10-CM | POA: Diagnosis not present

## 2021-05-30 DIAGNOSIS — M25662 Stiffness of left knee, not elsewhere classified: Secondary | ICD-10-CM | POA: Diagnosis not present

## 2021-05-30 DIAGNOSIS — M6281 Muscle weakness (generalized): Secondary | ICD-10-CM | POA: Diagnosis not present

## 2021-05-30 LAB — COMPLETE METABOLIC PANEL WITH GFR
AG Ratio: 1.3 (calc) (ref 1.0–2.5)
ALT: 11 U/L (ref 6–29)
AST: 16 U/L (ref 10–35)
Albumin: 4.1 g/dL (ref 3.6–5.1)
Alkaline phosphatase (APISO): 105 U/L (ref 31–125)
BUN: 15 mg/dL (ref 7–25)
CO2: 29 mmol/L (ref 20–32)
Calcium: 9.8 mg/dL (ref 8.6–10.2)
Chloride: 102 mmol/L (ref 98–110)
Creat: 0.56 mg/dL (ref 0.50–0.99)
Globulin: 3.1 g/dL (calc) (ref 1.9–3.7)
Glucose, Bld: 92 mg/dL (ref 65–99)
Potassium: 4.5 mmol/L (ref 3.5–5.3)
Sodium: 137 mmol/L (ref 135–146)
Total Bilirubin: 0.5 mg/dL (ref 0.2–1.2)
Total Protein: 7.2 g/dL (ref 6.1–8.1)
eGFR: 114 mL/min/{1.73_m2} (ref >=60)

## 2021-05-30 LAB — PROTEIN ELECTROPHORESIS, SERUM, WITH REFLEX
Albumin ELP: 3.9 g/dL (ref 3.8–4.8)
Alpha 1: 0.3 g/dL (ref 0.2–0.3)
Alpha 2: 0.8 g/dL (ref 0.5–0.9)
Beta 2: 0.4 g/dL (ref 0.2–0.5)
Beta Globulin: 0.5 g/dL (ref 0.4–0.6)
Gamma Globulin: 1.3 g/dL (ref 0.8–1.7)
Total Protein: 7.3 g/dL (ref 6.1–8.1)

## 2021-05-30 LAB — CBC WITH DIFFERENTIAL/PLATELET
Absolute Monocytes: 745 cells/uL (ref 200–950)
Basophils Absolute: 83 cells/uL (ref 0–200)
Basophils Relative: 0.9 %
Eosinophils Absolute: 221 cells/uL (ref 15–500)
Eosinophils Relative: 2.4 %
HCT: 42.5 % (ref 35.0–45.0)
Hemoglobin: 13.5 g/dL (ref 11.7–15.5)
Lymphs Abs: 2052 cells/uL (ref 850–3900)
MCH: 28.2 pg (ref 27.0–33.0)
MCHC: 31.8 g/dL — ABNORMAL LOW (ref 32.0–36.0)
MCV: 88.7 fL (ref 80.0–100.0)
MPV: 11 fL (ref 7.5–12.5)
Monocytes Relative: 8.1 %
Neutro Abs: 6100 cells/uL (ref 1500–7800)
Neutrophils Relative %: 66.3 %
Platelets: 334 10*3/uL (ref 140–400)
RBC: 4.79 10*6/uL (ref 3.80–5.10)
RDW: 13.9 % (ref 11.0–15.0)
Total Lymphocyte: 22.3 %
WBC: 9.2 10*3/uL (ref 3.8–10.8)

## 2021-05-30 LAB — RHEUMATOID FACTOR: Rheumatoid fact SerPl-aCnc: <14 IU/mL (ref <14)

## 2021-05-30 LAB — URIC ACID: Uric Acid, Serum: 5.7 mg/dL (ref 2.5–7.0)

## 2021-05-30 LAB — ANA: Anti Nuclear Antibody (ANA): NEGATIVE

## 2021-05-30 LAB — SEDIMENTATION RATE: Sed Rate: 29 mm/h — ABNORMAL HIGH (ref 0–20)

## 2021-05-30 LAB — TSH: TSH: 3.76 mIU/L

## 2021-05-30 LAB — CYCLIC CITRUL PEPTIDE ANTIBODY, IGG: Cyclic Citrullin Peptide Ab: <16 UNITS

## 2021-05-30 LAB — CK: Total CK: 44 U/L (ref 29–143)

## 2021-05-31 NOTE — Progress Notes (Signed)
I will discuss results at the follow-up visit.

## 2021-06-05 NOTE — Progress Notes (Signed)
Office Visit Note  Patient: Mckenzie Park             Date of Birth: 1975-09-12           MRN: [id number]             PCP: Velna Hatchet, MD Referring: Velna Hatchet, MD Visit Date: 06/18/2021 Occupation: @GUAROCC @  Subjective:  Pain in multiple joints and muscles  History of Present Illness: Mckenzie Park is a 46 y.o. female with history of polyarthralgias and myalgias.  She states she continues to have pain in her trapezius region, lower back, and feet.  Her lower bilateral hands, knees and her feet.  She has pain and discomfort in all of her muscles.  She has fatigue and insomnia.  She recently finished physical therapy for her knee and is recovering from that.  She will be going to the work tomorrow.  She is unable to sit for long time due to lower back pain.  Activities of Daily Living:  Patient reports morning stiffness for 45 minutes.   Patient Reports nocturnal pain.  Difficulty dressing/grooming: Denies Difficulty climbing stairs: Denies Difficulty getting out of chair: Denies Difficulty using hands for taps, buttons, cutlery, and/or writing: Reports  Review of Systems  Constitutional:  Positive for fatigue.  HENT:  Positive for nose dryness. Negative for mouth sores and mouth dryness.   Eyes:  Positive for itching and dryness. Negative for pain.  Respiratory:  Positive for shortness of breath and difficulty breathing.   Cardiovascular:  Positive for palpitations. Negative for chest pain.  Gastrointestinal:  Negative for blood in stool, constipation and diarrhea.  Endocrine: Negative for increased urination.  Genitourinary:  Negative for difficulty urinating.  Musculoskeletal:  Positive for joint pain, joint pain, joint swelling, myalgias, muscle weakness, morning stiffness and myalgias. Negative for muscle tenderness.  Skin:  Negative for color change, rash and redness.  Allergic/Immunologic: Negative for susceptible to infections.  Neurological:  Positive for  dizziness, numbness, headaches and weakness. Negative for memory loss.  Hematological:  Positive for bruising/bleeding tendency.  Psychiatric/Behavioral:  Negative for confusion.    PMFS History:  Patient Active Problem List   Diagnosis Date Noted   Chronic migraine without aura without status migrainosus, not intractable 08/01/2015   Essential hypertension 08/01/2015   Depression 08/01/2015    Past Medical History:  Diagnosis Date   Hypertension    Kidney stone     Family History  Problem Relation Age of Onset   Hypertension Mother    Heart disease Mother    COPD Mother    Parkinsonism Father    Hypertension Brother    AAA (abdominal aortic aneurysm) Brother 94   Asthma Son    Past Surgical History:  Procedure Laterality Date   ABDOMINAL HYSTERECTOMY     CHOLECYSTECTOMY     kidney stone removal     KNEE ARTHROSCOPY Bilateral    TUBAL LIGATION     Social History   Social History Narrative   Not on file   Immunization History  Administered Date(s) Administered   Influenza-Unspecified 01/31/2016     Objective: Vital Signs: BP (!) 142/90 (BP Location: Left Arm, Patient Position: Sitting, Cuff Size: Large)    Pulse 98    Ht 5' 6"  (1.676 m)    Wt 265 lb 6.4 oz (120.4 kg)    BMI 42.84 kg/m    Physical Exam Vitals and nursing note reviewed.  Constitutional:      Appearance: She is well-developed.  HENT:  Head: Normocephalic and atraumatic.  Eyes:     Conjunctiva/sclera: Conjunctivae normal.  Cardiovascular:     Rate and Rhythm: Normal rate and regular rhythm.     Heart sounds: Normal heart sounds.  Pulmonary:     Effort: Pulmonary effort is normal.     Breath sounds: Normal breath sounds.  Abdominal:     General: Bowel sounds are normal.     Palpations: Abdomen is soft.  Musculoskeletal:     Cervical back: Normal range of motion.  Lymphadenopathy:     Cervical: No cervical adenopathy.  Skin:    General: Skin is warm and dry.     Capillary Refill:  Capillary refill takes less than 2 seconds.  Neurological:     Mental Status: She is alert and oriented to person, place, and time.  Psychiatric:        Behavior: Behavior normal.     Musculoskeletal Exam: C-spine was in good range of motion.  She had bilateral trapezius spasm.  She had discomfort in her lower back on palpation.  She had no SI joint tenderness.  Shoulder joints, elbow joints, wrist joints, MCPs PIPs and DIPs with good range of motion with no synovitis.  Hip joints, knee joints, ankles, MTPs and PIPs with good range of motion with no synovitis.  She has generalized hyperalgesia and positive tender points.  CDAI Exam: CDAI Score: -- Patient Global: --; Provider Global: -- Swollen: --; Tender: -- Joint Exam 06/18/2021   No joint exam has been documented for this visit   There is currently no information documented on the homunculus. Go to the Rheumatology activity and complete the homunculus joint exam.  Investigation: No additional findings.  Imaging: XR HIPS BILAT W OR W/O PELVIS 3-4 VIEWS  Result Date: 05/28/2021 No hip joint narrowing was noted.  No SI joint narrowing or sclerosis was noted. Impression: Unremarkable x-rays of the hip joints.  XR Foot 2 Views Left  Result Date: 05/28/2021 First MTP, PIP and DIP narrowing was noted.  No intertarsal, tibiotalar or subtalar narrowing was noted.  Dorsal spurring was noted.  Inferior calcaneal spur was noted.  No erosive changes were noted. Impression: These findings are consistent with osteoarthritis of the foot.  XR Hand 2 View Left  Result Date: 05/28/2021 PIP and DIP narrowing was noted.  No MCP, intercarpal or radiocarpal joint space narrowing was noted.  No erosive changes were noted. Impression: These findings are consistent with osteoarthritis of the hand.  XR Hand 2 View Right  Result Date: 05/28/2021 PIP and DIP narrowing was noted.  No MCP, intercarpal or radiocarpal joint space narrowing was noted.  No erosive  changes were noted. Impression: These findings are consistent with osteoarthritis of the hand.  XR Lumbar Spine 2-3 Views  Result Date: 05/28/2021 No significant disc space narrowing was noted.  Anterior spurring was noted.  L4-L5 facet joint narrowing was noted. Impression: These findings are consistent with mild degenerative changes and facet joint arthropathy.   Recent Labs: Lab Results  Component Value Date   WBC 9.2 05/28/2021   HGB 13.5 05/28/2021   PLT 334 05/28/2021   NA 137 05/28/2021   K 4.5 05/28/2021   CL 102 05/28/2021   CO2 29 05/28/2021   GLUCOSE 92 05/28/2021   BUN 15 05/28/2021   CREATININE 0.56 05/28/2021   BILITOT 0.5 05/28/2021   AST 16 05/28/2021   ALT 11 05/28/2021   PROT 7.2 05/28/2021   PROT 7.3 05/28/2021   CALCIUM 9.8 05/28/2021  GFRAA 128 11/09/2019   May 28, 2021 SPEP normal, ESR 29, CK 44, TSH normal, RF negative, anti-CCP negative, ANA negative, uric acid 5.7  Speciality Comments: No specialty comments available.  Procedures:  No procedures performed Allergies: Patient has no known allergies.   Assessment / Plan:     Visit Diagnoses: Polyarthralgia - History of pain and discomfort in multiple joints.  She has been under a lot of stress over the last 3 years from losing several family members.  Primary osteoarthritis of both hands - Clinical and radiographic findings are consistent with osteoarthritis.  X-ray findings were discussed with the patient.  All autoimmune work-up was negative.  Left findings were discussed with the patient.  She works at [employer] and lifts heavy objects.  Joint protection muscle strengthening was discussed.  Patient states she does not have very good grip strength.  Active Handout on hand exercises.  Chronic pain of both hips - She complains of discomfort in her bilateral hip joints.  X-rays were unremarkable.  Trochanteric bursitis of both hips - A handout on IT band stretches was given at the last visit.  She has  not started IT band stretches yet.  Advised to start doing stretching exercises.  Chronic pain of both knees - She had arthroscopic surgery in the right knee joint couple of years ago due to work-related injury.  Left knee joint meniscal tear repair November 2022.    Primary osteoarthritis of both feet - Clinical and radiographic findings were consistent with osteoarthritis.  She continues to have chronic pain and discomfort.  Proper fitting shoes with good arch support were discussed.  Arthropathy of lumbar facet joint - She has chronic lower back pain.  X-rays were consistent with facet joint arthropathy.  She has chronic lower back pain and has difficulty sitting for prolonged time.  Therapy.  A handout on back exercises was given.- Plan: Ambulatory referral to Physical Therapy  Fibromyalgia .  She had hyperalgesia, positive tender points and generalized pain.  Detailed counseling fibromyalgia was provided.  Need for regular exercise and stretching was emphasized.  I will also refer her to physical therapy.  She works third shift and does not with rest. - Plan: Ambulatory referral to Physical Therapy  Other insomnia - Good sleep hygiene was discussed.  Other fatigue-related to insomnia and fibromyalgia.  Essential hypertension-blood pressure was elevated.  She was advised to monitor blood pressure closely.  History of hyperlipidemia  Chronic migraine without aura without status migrainosus, not intractable  Vitamin D deficiency - Vitamin D was 23 on March 15, 2021.  She was advised to continue vitamin D.  Anxiety and depression  History of kidney stones - History of kidney stone extraction in her 14s.  Uric acid was normal.  Family history of fibromyalgia - Mother, cousin and niece.  Family history of gout - Sister  Orders: Orders Placed This Encounter  Procedures   Ambulatory referral to Physical Therapy   No orders of the defined types were placed in this  encounter.    Follow-Up Instructions: Return if symptoms worsen or fail to improve, for Osteoarthritis.   Bo Merino, MD  Note - This record has been created using Editor, commissioning.  Chart creation errors have been sought, but may not always  have been located. Such creation errors do not reflect on  the standard of medical care.

## 2021-06-06 DIAGNOSIS — M25662 Stiffness of left knee, not elsewhere classified: Secondary | ICD-10-CM | POA: Diagnosis not present

## 2021-06-06 DIAGNOSIS — R262 Difficulty in walking, not elsewhere classified: Secondary | ICD-10-CM | POA: Diagnosis not present

## 2021-06-06 DIAGNOSIS — M25562 Pain in left knee: Secondary | ICD-10-CM | POA: Diagnosis not present

## 2021-06-06 DIAGNOSIS — M6281 Muscle weakness (generalized): Secondary | ICD-10-CM | POA: Diagnosis not present

## 2021-06-13 DIAGNOSIS — M25562 Pain in left knee: Secondary | ICD-10-CM | POA: Diagnosis not present

## 2021-06-13 DIAGNOSIS — M6281 Muscle weakness (generalized): Secondary | ICD-10-CM | POA: Diagnosis not present

## 2021-06-13 DIAGNOSIS — R262 Difficulty in walking, not elsewhere classified: Secondary | ICD-10-CM | POA: Diagnosis not present

## 2021-06-13 DIAGNOSIS — M25662 Stiffness of left knee, not elsewhere classified: Secondary | ICD-10-CM | POA: Diagnosis not present

## 2021-06-18 ENCOUNTER — Encounter: Payer: Self-pay | Admitting: Rheumatology

## 2021-06-18 ENCOUNTER — Other Ambulatory Visit: Payer: Self-pay

## 2021-06-18 ENCOUNTER — Ambulatory Visit (INDEPENDENT_AMBULATORY_CARE_PROVIDER_SITE_OTHER): Payer: BC Managed Care – PPO | Admitting: Rheumatology

## 2021-06-18 VITALS — BP 142/90 | HR 98 | Ht 66.0 in | Wt 265.4 lb

## 2021-06-18 DIAGNOSIS — M25562 Pain in left knee: Secondary | ICD-10-CM

## 2021-06-18 DIAGNOSIS — Z87442 Personal history of urinary calculi: Secondary | ICD-10-CM

## 2021-06-18 DIAGNOSIS — I10 Essential (primary) hypertension: Secondary | ICD-10-CM

## 2021-06-18 DIAGNOSIS — M47816 Spondylosis without myelopathy or radiculopathy, lumbar region: Secondary | ICD-10-CM

## 2021-06-18 DIAGNOSIS — M19072 Primary osteoarthritis, left ankle and foot: Secondary | ICD-10-CM

## 2021-06-18 DIAGNOSIS — M19041 Primary osteoarthritis, right hand: Secondary | ICD-10-CM

## 2021-06-18 DIAGNOSIS — Z8269 Family history of other diseases of the musculoskeletal system and connective tissue: Secondary | ICD-10-CM

## 2021-06-18 DIAGNOSIS — M7062 Trochanteric bursitis, left hip: Secondary | ICD-10-CM

## 2021-06-18 DIAGNOSIS — M25561 Pain in right knee: Secondary | ICD-10-CM

## 2021-06-18 DIAGNOSIS — Z8639 Personal history of other endocrine, nutritional and metabolic disease: Secondary | ICD-10-CM

## 2021-06-18 DIAGNOSIS — R5383 Other fatigue: Secondary | ICD-10-CM

## 2021-06-18 DIAGNOSIS — M2550 Pain in unspecified joint: Secondary | ICD-10-CM | POA: Diagnosis not present

## 2021-06-18 DIAGNOSIS — M25551 Pain in right hip: Secondary | ICD-10-CM

## 2021-06-18 DIAGNOSIS — G43709 Chronic migraine without aura, not intractable, without status migrainosus: Secondary | ICD-10-CM

## 2021-06-18 DIAGNOSIS — M25552 Pain in left hip: Secondary | ICD-10-CM

## 2021-06-18 DIAGNOSIS — F419 Anxiety disorder, unspecified: Secondary | ICD-10-CM

## 2021-06-18 DIAGNOSIS — M19042 Primary osteoarthritis, left hand: Secondary | ICD-10-CM

## 2021-06-18 DIAGNOSIS — F32A Depression, unspecified: Secondary | ICD-10-CM

## 2021-06-18 DIAGNOSIS — M791 Myalgia, unspecified site: Secondary | ICD-10-CM

## 2021-06-18 DIAGNOSIS — M19071 Primary osteoarthritis, right ankle and foot: Secondary | ICD-10-CM

## 2021-06-18 DIAGNOSIS — M797 Fibromyalgia: Secondary | ICD-10-CM

## 2021-06-18 DIAGNOSIS — M7061 Trochanteric bursitis, right hip: Secondary | ICD-10-CM

## 2021-06-18 DIAGNOSIS — G4709 Other insomnia: Secondary | ICD-10-CM

## 2021-06-18 DIAGNOSIS — G8929 Other chronic pain: Secondary | ICD-10-CM

## 2021-06-18 DIAGNOSIS — E559 Vitamin D deficiency, unspecified: Secondary | ICD-10-CM

## 2021-06-18 NOTE — Patient Instructions (Addendum)
Back Exercises The following exercises strengthen the muscles that help to support the trunk (torso) and back. They also help to keep the lower back flexible. Doing these exercises can help to prevent or lessen existing low back pain. If you have back pain or discomfort, try doing these exercises 2-3 times each day or as told by your health care provider. As your pain improves, do them once each day, but increase the number of times that you repeat the steps for each exercise (do more repetitions). To prevent the recurrence of back pain, continue to do these exercises once each day or as told by your health care provider. Do exercises exactly as told by your health care provider and adjust them as directed. It is normal to feel mild stretching, pulling, tightness, or discomfort as you do these exercises, but you should stop right away if you feel sudden pain or your pain gets worse. Exercises Single knee to chest Repeat these steps 3-5 times for each leg: Lie on your back on a firm bed or the floor with your legs extended. Bring one knee to your chest. Your other leg should stay extended and in contact with the floor. Hold your knee in place by grabbing your knee or thigh with both hands and hold. Pull on your knee until you feel a gentle stretch in your lower back or buttocks. Hold the stretch for 10-30 seconds. Slowly release and straighten your leg.  Pelvic tilt Repeat these steps 5-10 times: Lie on your back on a firm bed or the floor with your legs extended. Bend your knees so they are pointing toward the ceiling and your feet are flat on the floor. Tighten your lower abdominal muscles to press your lower back against the floor. This motion will tilt your pelvis so your tailbone points up toward the ceiling instead of pointing to your feet or the floor. With gentle tension and even breathing, hold this position for 5-10 seconds.  Cat-cow Repeat these steps until your lower back becomes  more flexible: Get into a hands-and-knees position on a firm bed or the floor. Keep your hands under your shoulders, and keep your knees under your hips. You may place padding under your knees for comfort. Let your head hang down toward your chest. Contract your abdominal muscles and point your tailbone toward the floor so your lower back becomes rounded like the back of a cat. Hold this position for 5 seconds. Slowly lift your head, let your abdominal muscles relax, and point your tailbone up toward the ceiling so your back forms a sagging arch like the back of a cow. Hold this position for 5 seconds.  Press-ups Repeat these steps 5-10 times: Lie on your abdomen (face-down) on a firm bed or the floor. Place your palms near your head, about shoulder-width apart. Keeping your back as relaxed as possible and keeping your hips on the floor, slowly straighten your arms to raise the top half of your body and lift your shoulders. Do not use your back muscles to raise your upper torso. You may adjust the placement of your hands to make yourself more comfortable. Hold this position for 5 seconds while you keep your back relaxed. Slowly return to lying flat on the floor.  Bridges Repeat these steps 10 times: Lie on your back on a firm bed or the floor. Bend your knees so they are pointing toward the ceiling and your feet are flat on the floor. Your arms should be flat  at your sides, next to your body. Tighten your buttocks muscles and lift your buttocks off the floor until your waist is at almost the same height as your knees. You should feel the muscles working in your buttocks and the back of your thighs. If you do not feel these muscles, slide your feet 1-2 inches (2.5-5 cm) farther away from your buttocks. Hold this position for 3-5 seconds. Slowly lower your hips to the starting position, and allow your buttocks muscles to relax completely. If this exercise is too easy, try doing it with your arms  crossed over your chest. Abdominal crunches Repeat these steps 5-10 times: Lie on your back on a firm bed or the floor with your legs extended. Bend your knees so they are pointing toward the ceiling and your feet are flat on the floor. Cross your arms over your chest. Tip your chin slightly toward your chest without bending your neck. Tighten your abdominal muscles and slowly raise your torso high enough to lift your shoulder blades a tiny bit off the floor. Avoid raising your torso higher than that because it can put too much stress on your lower back and does not help to strengthen your abdominal muscles. Slowly return to your starting position.  Back lifts Repeat these steps 5-10 times: Lie on your abdomen (face-down) with your arms at your sides, and rest your forehead on the floor. Tighten the muscles in your legs and your buttocks. Slowly lift your chest off the floor while you keep your hips pressed to the floor. Keep the back of your head in line with the curve in your back. Your eyes should be looking at the floor. Hold this position for 3-5 seconds. Slowly return to your starting position.  Contact a health care provider if: Your back pain or discomfort gets much worse when you do an exercise. Your worsening back pain or discomfort does not lessen within 2 hours after you exercise. If you have any of these problems, stop doing these exercises right away. Do not do them again unless your health care provider says that you can. Get help right away if: You develop sudden, severe back pain. If this happens, stop doing the exercises right away. Do not do them again unless your health care provider says that you can. This information is not intended to replace advice given to you by your health care provider. Make sure you discuss any questions you have with your health care provider. Document Revised: 11/06/2020 Document Reviewed: 07/25/2020 Elsevier Patient Education  [place] Exercises Hand exercises can be helpful for almost anyone. These exercises can strengthen the hands, improve flexibility and movement, and increase blood flow to the hands. These results can make work and daily tasks easier. Hand exercises can be especially helpful for people who have joint pain from arthritis or have nerve damage from overuse (carpal tunnel syndrome). These exercises can also help people who have injured a hand. Exercises Most of these hand exercises are gentle stretching and motion exercises. It is usually safe to do them often throughout the day. Warming up your hands before exercise may help to reduce stiffness. You can do this with gentle massage or by placing your hands in warm water for 10-15 minutes. It is normal to feel some stretching, pulling, tightness, or mild discomfort as you begin new exercises. This will gradually improve. Stop an exercise right away if you feel sudden, severe pain or your pain gets worse. Ask  your health care provider which exercises are best for you. Knuckle bend or "claw" fist  Stand or sit with your arm, hand, and all five fingers pointed straight up. Make sure to keep your wrist straight during the exercise. Gently bend your fingers down toward your palm until the tips of your fingers are touching the top of your palm. Keep your big knuckle straight and just bend the small knuckles in your fingers. Hold this position for __________ seconds. Straighten (extend) your fingers back to the starting position. Repeat this exercise 5-10 times with each hand. Full finger fist  Stand or sit with your arm, hand, and all five fingers pointed straight up. Make sure to keep your wrist straight during the exercise. Gently bend your fingers into your palm until the tips of your fingers are touching the middle of your palm. Hold this position for __________ seconds. Extend your fingers back to the starting position, stretching every joint  fully. Repeat this exercise 5-10 times with each hand. Straight fist Stand or sit with your arm, hand, and all five fingers pointed straight up. Make sure to keep your wrist straight during the exercise. Gently bend your fingers at the big knuckle, where your fingers meet your hand, and the middle knuckle. Keep the knuckle at the tips of your fingers straight and try to touch the bottom of your palm. Hold this position for __________ seconds. Extend your fingers back to the starting position, stretching every joint fully. Repeat this exercise 5-10 times with each hand. Tabletop  Stand or sit with your arm, hand, and all five fingers pointed straight up. Make sure to keep your wrist straight during the exercise. Gently bend your fingers at the big knuckle, where your fingers meet your hand, as far down as you can while keeping the small knuckles in your fingers straight. Think of forming a tabletop with your fingers. Hold this position for __________ seconds. Extend your fingers back to the starting position, stretching every joint fully. Repeat this exercise 5-10 times with each hand. Finger spread  Place your hand flat on a table with your palm facing down. Make sure your wrist stays straight as you do this exercise. Spread your fingers and thumb apart from each other as far as you can until you feel a gentle stretch. Hold this position for __________ seconds. Bring your fingers and thumb tight together again. Hold this position for __________ seconds. Repeat this exercise 5-10 times with each hand. Making circles  Stand or sit with your arm, hand, and all five fingers pointed straight up. Make sure to keep your wrist straight during the exercise. Make a circle by touching the tip of your thumb to the tip of your index finger. Hold for __________ seconds. Then open your hand wide. Repeat this motion with your thumb and each finger on your hand. Repeat this exercise 5-10 times with each  hand. Thumb motion  Sit with your forearm resting on a table and your wrist straight. Your thumb should be facing up toward the ceiling. Keep your fingers relaxed as you move your thumb. Lift your thumb up as high as you can toward the ceiling. Hold for __________ seconds. Bend your thumb across your palm as far as you can, reaching the tip of your thumb for the small finger (pinkie) side of your palm. Hold for __________ seconds. Repeat this exercise 5-10 times with each hand. Grip strengthening  Hold a stress ball or other soft ball in the middle  of your hand. Slowly increase the pressure, squeezing the ball as much as you can without causing pain. Think of bringing the tips of your fingers into the middle of your palm. All of your finger joints should bend when doing this exercise. Hold your squeeze for __________ seconds, then relax. Repeat this exercise 5-10 times with each hand. Contact a health care provider if: Your hand pain or discomfort gets much worse when you do an exercise. Your hand pain or discomfort does not improve within 2 hours after you exercise. If you have any of these problems, stop doing these exercises right away. Do not do them again unless your health care provider says that you can. Get help right away if: You develop sudden, severe hand pain or swelling. If this happens, stop doing these exercises right away. Do not do them again unless your health care provider says that you can. This information is not intended to replace advice given to you by your health care provider. Make sure you discuss any questions you have with your health care provider. Document Revised: 08/30/2020 Document Reviewed: 08/30/2020 Elsevier Patient Education  [place].

## 2021-07-01 DIAGNOSIS — E785 Hyperlipidemia, unspecified: Secondary | ICD-10-CM | POA: Diagnosis not present

## 2021-07-02 DIAGNOSIS — J45909 Unspecified asthma, uncomplicated: Secondary | ICD-10-CM | POA: Diagnosis not present

## 2021-07-02 DIAGNOSIS — R051 Acute cough: Secondary | ICD-10-CM | POA: Diagnosis not present

## 2021-07-02 DIAGNOSIS — R0981 Nasal congestion: Secondary | ICD-10-CM | POA: Diagnosis not present

## 2021-07-03 ENCOUNTER — Encounter: Payer: Self-pay | Admitting: Gastroenterology

## 2021-07-30 ENCOUNTER — Ambulatory Visit (AMBULATORY_SURGERY_CENTER): Payer: BC Managed Care – PPO

## 2021-07-30 ENCOUNTER — Other Ambulatory Visit: Payer: Self-pay

## 2021-07-30 VITALS — Ht 66.0 in | Wt 255.0 lb

## 2021-07-30 DIAGNOSIS — Z1211 Encounter for screening for malignant neoplasm of colon: Secondary | ICD-10-CM

## 2021-07-30 MED ORDER — PLENVU 140 G PO SOLR: 1.0000 | ORAL | 0 refills | Status: DC

## 2021-07-30 NOTE — Progress Notes (Signed)
? ?  Patient's pre-visit was done today over the phone with the patient  ? ?Name,DOB and address verified.  ?  ?Patient denies any allergies to Eggs and Soy.  ? ?Patient denies any problems with anesthesia/sedation. ? ?Patient denies taking diet pills or blood thinners.  ? ?Denies atrial flutter or atrial fib ? ?Denies chronic constipation ? ?No home Oxygen.  ? ?Packet of Prep instructions sent by My Chart or mail to patient including a copy of a consent form if by mail-pt is aware.  ? ?Patient understands to call us back with any questions or concerns.  ? ?Patient is aware of our care-partner policy and [id number] safety protocol.  ? ? ?Plenv[employer] coupon mailed to pt ?  ? ?  ?

## 2021-08-01 ENCOUNTER — Encounter: Payer: Self-pay | Admitting: Gastroenterology

## 2021-08-13 ENCOUNTER — Other Ambulatory Visit: Payer: Self-pay

## 2021-08-13 ENCOUNTER — Ambulatory Visit (AMBULATORY_SURGERY_CENTER): Payer: BC Managed Care – PPO | Admitting: Gastroenterology

## 2021-08-13 ENCOUNTER — Other Ambulatory Visit: Payer: Self-pay | Admitting: Gastroenterology

## 2021-08-13 ENCOUNTER — Encounter: Payer: Self-pay | Admitting: Gastroenterology

## 2021-08-13 VITALS — BP 133/70 | HR 50 | Temp 97.8°F | Resp 13 | Ht 66.0 in | Wt 255.0 lb

## 2021-08-13 DIAGNOSIS — Z1211 Encounter for screening for malignant neoplasm of colon: Secondary | ICD-10-CM

## 2021-08-13 DIAGNOSIS — D122 Benign neoplasm of ascending colon: Secondary | ICD-10-CM

## 2021-08-13 MED ORDER — SODIUM CHLORIDE 0.9 % IV SOLN: 500.0000 mL | Freq: Once | INTRAVENOUS | Status: DC

## 2021-08-13 NOTE — Progress Notes (Signed)
VS completed by CW.   Pt's states no medical or surgical changes since previsit or office visit.  

## 2021-08-13 NOTE — Patient Instructions (Signed)
Handouts Provided:  Polyps  YOU HAD AN ENDOSCOPIC PROCEDURE TODAY AT THE [facility]:   Refer to the procedure report that was given to you for any specific questions about what was found during the examination.  If the procedure report does not answer your questions, please call your gastroenterologist to clarify.  If you requested that your care partner not be given the details of your procedure findings, then the procedure report has been included in a sealed envelope for you to review at your convenience later.  YOU SHOULD EXPECT: Some feelings of bloating in the abdomen. Passage of more gas than usual.  Walking can help get rid of the air that was put into your GI tract during the procedure and reduce the bloating. If you had a lower endoscopy (such as a colonoscopy or flexible sigmoidoscopy) you may notice spotting of blood in your stool or on the toilet paper. If you underwent a bowel prep for your procedure, you may not have a normal bowel movement for a few days.  Please Note:  You might notice some irritation and congestion in your nose or some drainage.  This is from the oxygen used during your procedure.  There is no need for concern and it should clear up in a day or so.  SYMPTOMS TO REPORT IMMEDIATELY:   Following lower endoscopy (colonoscopy or flexible sigmoidoscopy):  Excessive amounts of blood in the stool  Significant tenderness or worsening of abdominal pains  Swelling of the abdomen that is new, acute  Fever of 100F or higher  For urgent or emergent issues, a gastroenterologist can be reached at any hour by calling [phone]. Do not use MyChart messaging for urgent concerns.    DIET:  We do recommend a small meal at first, but then you may proceed to your regular diet.  Drink plenty of fluids but you should avoid alcoholic beverages for 24 hours.  ACTIVITY:  You should plan to take it easy for the rest of today and you should NOT DRIVE or use heavy  machinery until tomorrow (because of the sedation medicines used during the test).    FOLLOW UP: Our staff will call the number listed on your records 48-72 hours following your procedure to check on you and address any questions or concerns that you may have regarding the information given to you following your procedure. If we do not reach you, we will leave a message.  We will attempt to reach you two times.  During this call, we will ask if you have developed any symptoms of COVID 19. If you develop any symptoms (ie: fever, flu-like symptoms, shortness of breath, cough etc.) before then, please call [phone].  If you test positive for Covid 19 in the 2 weeks post procedure, please call and report this information to us.    If any biopsies were taken you will be contacted by phone or by letter within the next 1-3 weeks.  Please call us at [phone] if you have not heard about the biopsies in 3 weeks.    SIGNATURES/CONFIDENTIALITY: You and/or your care partner have signed paperwork which will be entered into your electronic medical record.  These signatures attest to the fact that that the information above on your After Visit Summary has been reviewed and is understood.  Full responsibility of the confidentiality of this discharge information lies with you and/or your care-partner.  

## 2021-08-13 NOTE — Progress Notes (Signed)
History and Physical: ? This patient presents for endoscopic testing for: ?Encounter Diagnosis  ?Name Primary?  ? Special screening for malignant neoplasms, colon Yes  ? ? ?First screening. ?Patient denies chronic abdominal pain, rectal bleeding, constipation or diarrhea. ? ? ?ROS: ?Patient denies chest pain or shortness of breath ? ? ?Past Medical History: ?Past Medical History:  ?Diagnosis Date  ? Anxiety   ? Depression   ? GERD (gastroesophageal reflux disease)   ? Heart murmur   ? irregular heart beat is on medication  ? Hyperlipidemia   ? Hypertension   ? Kidney stone   ? ? ? ?Past Surgical History: ?Past Surgical History:  ?Procedure Laterality Date  ? ABDOMINAL HYSTERECTOMY    ? CHOLECYSTECTOMY    ? kidney stone removal    ? KNEE ARTHROSCOPY Bilateral   ? TUBAL LIGATION    ? ? ?Allergies: ?No Known Allergies ? ?Outpatient Meds: ?Current Outpatient Medications  ?Medication Sig Dispense Refill  ? ALPRAZolam (XANAX) 0.25 MG tablet Take 1 tablet by mouth in the morning, at noon, and at bedtime. As needed for Panic /Anxiety    ? aspirin 81 MG EC tablet Take 1 tablet by mouth daily.    ? Cholecalciferol (D-3-5) 125 MCG (5000 UT) capsule Take 5,000 Units by mouth daily.    ? escitalopram (LEXAPRO) 10 MG tablet Take 1 tablet by mouth daily.    ? losartan (COZAAR) 100 MG tablet Take 1 tablet (100 mg total) by mouth every evening. 90 tablet 3  ? propranolol (INDERAL) 20 MG tablet Take 1 tablet (20 mg total) by mouth 3 (three) times daily. Take extra tablet if SBP > 150 or or anxiety 280 tablet 1  ? rosuvastatin (CRESTOR) 5 MG tablet Take 5 mg by mouth at bedtime.    ? albuterol (VENTOLIN HFA) 108 (90 Base) MCG/ACT inhaler Inhale 2 puffs into the lungs every 6 (six) hours as needed for wheezing or shortness of breath.    ? ALPRAZolam (XANAX) 0.5 MG tablet Take 0.5 mg by mouth 3 (three) times daily as needed.    ? nitroGLYCERIN (NITROSTAT) 0.4 MG SL tablet Place 1 tablet (0.4 mg total) under the tongue every 5 (five)  minutes as needed for up to 25 days for chest pain. 25 tablet 3  ? ?Current Facility-Administered Medications  ?Medication Dose Route Frequency Provider Last Rate Last Admin  ? 0.9 %  sodium chloride infusion  500 mL Intravenous Once Doran Stabler, MD      ? ? ? ? ?___________________________________________________________________ ?Objective  ? ?Exam: ? ?BP (!) 148/81   Pulse (!) 53   Temp 97.8 ?F (36.6 ?C) (Temporal)   Ht '5\' 6"'$  (1.676 m)   Wt 255 lb (115.7 kg)   SpO2 100%   BMI 41.16 kg/m?  ? ?CV: RRR without murmur, S1/S2 ?Resp: clear to auscultation bilaterally, normal RR and effort noted ?GI: soft, no tenderness, with active bowel sounds. ? ? ?Assessment: ?Encounter Diagnosis  ?Name Primary?  ? Special screening for malignant neoplasms, colon Yes  ? ? ? ?Plan: ?Colonoscopy ? The benefits and risks of the planned procedure were described in detail with the patient or (when appropriate) their health care proxy.  Risks were outlined as including, but not limited to, bleeding, infection, perforation, adverse medication reaction leading to cardiac or pulmonary decompensation, pancreatitis (if ERCP).  The limitation of incomplete mucosal visualization was also discussed.  No guarantees or warranties were given. ? ? ? ?The patient is appropriate for  an endoscopic procedure in the ambulatory setting. ? ? - Wilfrid Lund, MD ? ? ? ? ?

## 2021-08-13 NOTE — Progress Notes (Signed)
Pt awake, report to RN, [facility]  °

## 2021-08-13 NOTE — Op Note (Signed)
[facility] ?Patient Name: Mckenzie Park ?Procedure Date: 08/13/2021 8:21 AM ?MRN: [id number] ?Endoscopist: Estill Cotta. Loletha Carrow , MD ?Age: 46 ?Referring MD:  ?Date of Birth: Dec 03, 1975 ?Gender: Female ?Account #: [id number] ?Procedure:                Colonoscopy ?Indications:              Screening for colorectal malignant neoplasm, This  ?                          is the patient's first colonoscopy ?Medicines:                Monitored Anesthesia Care ?Procedure:                Pre-Anesthesia Assessment: ?                          - Prior to the procedure, a History and Physical  ?                          was performed, and patient medications and  ?                          allergies were reviewed. The patient's tolerance of  ?                          previous anesthesia was also reviewed. The risks  ?                          and benefits of the procedure and the sedation  ?                          options and risks were discussed with the patient.  ?                          All questions were answered, and informed consent  ?                          was obtained. Prior Anticoagulants: The patient has  ?                          taken no previous anticoagulant or antiplatelet  ?                          agents. ASA Grade Assessment: III - A patient with  ?                          severe systemic disease. After reviewing the risks  ?                          and benefits, the patient was deemed in  ?                          satisfactory condition to undergo the procedure. ?  After obtaining informed consent, the colonoscope  ?                          was passed under direct vision. Throughout the  ?                          procedure, the patient's blood pressure, pulse, and  ?                          oxygen saturations were monitored continuously. The  ?                          Pangburn #[id number] was introduced through  ?                          the anus and advanced  to the the cecum, identified  ?                          by appendiceal orifice and ileocecal valve. The  ?                          colonoscopy was performed with difficulty due to a  ?                          redundant colon, significant looping and the  ?                          patient's body habitus. Successful completion of  ?                          the procedure was aided by using manual pressure  ?                          and straightening and shortening the scope to  ?                          obtain bowel loop reduction. The patient tolerated  ?                          the procedure well. The quality of the bowel  ?                          preparation was excellent. The ileocecal valve,  ?                          appendiceal orifice, and rectum were photographed.  ?                          The bowel preparation used was GoLYTELY. ?Scope In: 8:32:40 AM ?Scope Out: 8:55:37 AM ?Scope Withdrawal Time: 0 hours 12 minutes 4 seconds  ?Total Procedure Duration: 0 hours 22 minutes 57 seconds  ?Findings:                 The perianal and digital rectal examinations  were  ?                          normal. ?                          Repeat examination of right colon under NBI  ?                          performed. ?                          A 4 mm polyp was found in the ascending colon. The  ?                          polyp was sessile. The polyp was removed with a  ?                          cold snare. Resection and retrieval were complete. ?                          The exam was otherwise without abnormality on  ?                          direct and retroflexion views. ?Complications:            No immediate complications. ?Estimated Blood Loss:     Estimated blood loss: none. ?Impression:               - One 4 mm polyp in the ascending colon, removed  ?                          with a cold snare. Resected and retrieved. ?                          - The examination was otherwise normal on direct  ?                           and retroflexion views. ?Recommendation:           - Patient has a contact number available for  ?                          emergencies. The signs and symptoms of potential  ?                          delayed complications were discussed with the  ?                          patient. Return to normal activities tomorrow.  ?                          Written discharge instructions were provided to the  ?                          patient. ?                          -  Resume previous diet. ?                          - Continue present medications. ?                          - Await pathology results. ?                          - Repeat colonoscopy is recommended for  ?                          surveillance. The colonoscopy date will be  ?                          determined after pathology results from today's  ?                          exam become available for review. ?Dallan Schonberg L. Loletha Carrow, MD ?08/13/2021 8:58:49 AM ?This report has been signed electronically. ?

## 2021-08-15 ENCOUNTER — Encounter: Payer: Self-pay | Admitting: Gastroenterology

## 2021-08-15 ENCOUNTER — Telehealth: Payer: Self-pay

## 2021-08-15 NOTE — Telephone Encounter (Signed)
?  Follow up Call- ? ? ?  08/13/2021  ?  7:33 AM  ?Call back number  ?Post procedure Call Back phone  # [phone]  ?Permission to leave phone message Yes  ?  ? ?Patient questions: ? ?Do you have a fever, pain , or abdominal swelling? No. ?Pain Score  0 * ? ?Have you tolerated food without any problems? Yes.   ? ?Have you been able to return to your normal activities? Yes.   ? ?Do you have any questions about your discharge instructions: ?Diet   No. ?Medications  No. ?Follow up visit  No. ? ?Do you have questions or concerns about your Care? No. ? ?Actions: ?* If pain score is 4 or above: ?No action needed, pain <4. ? ?Have you developed a fever since your procedure? no ? ?2.   Have you had an respiratory symptoms (SOB or cough) since your procedure? no ? ?3.   Have you tested positive for COVID 19 since your procedure no ? ?4.   Have you had any family members/close contacts diagnosed with the COVID 19 since your procedure?  no ? ? ?If yes to any of these questions please route to Joylene John, RN and Joella Prince, RN  ? ? ?

## 2021-09-25 DIAGNOSIS — I10 Essential (primary) hypertension: Secondary | ICD-10-CM | POA: Diagnosis not present

## 2021-09-25 DIAGNOSIS — F330 Major depressive disorder, recurrent, mild: Secondary | ICD-10-CM | POA: Diagnosis not present

## 2021-09-25 DIAGNOSIS — M1990 Unspecified osteoarthritis, unspecified site: Secondary | ICD-10-CM | POA: Diagnosis not present

## 2021-09-25 DIAGNOSIS — Z1339 Encounter for screening examination for other mental health and behavioral disorders: Secondary | ICD-10-CM | POA: Diagnosis not present

## 2021-10-24 DIAGNOSIS — Z01419 Encounter for gynecological examination (general) (routine) without abnormal findings: Secondary | ICD-10-CM | POA: Diagnosis not present

## 2021-10-24 DIAGNOSIS — Z6841 Body Mass Index (BMI) 40.0 and over, adult: Secondary | ICD-10-CM | POA: Diagnosis not present

## 2021-12-05 DIAGNOSIS — R102 Pelvic and perineal pain: Secondary | ICD-10-CM | POA: Diagnosis not present

## 2021-12-19 DIAGNOSIS — F330 Major depressive disorder, recurrent, mild: Secondary | ICD-10-CM | POA: Diagnosis not present

## 2022-01-08 DIAGNOSIS — N3010 Interstitial cystitis (chronic) without hematuria: Secondary | ICD-10-CM | POA: Diagnosis not present

## 2022-01-08 DIAGNOSIS — N393 Stress incontinence (female) (male): Secondary | ICD-10-CM | POA: Diagnosis not present

## 2022-01-08 DIAGNOSIS — N3281 Overactive bladder: Secondary | ICD-10-CM | POA: Diagnosis not present

## 2022-01-08 DIAGNOSIS — R300 Dysuria: Secondary | ICD-10-CM | POA: Diagnosis not present

## 2022-01-31 ENCOUNTER — Ambulatory Visit (INDEPENDENT_AMBULATORY_CARE_PROVIDER_SITE_OTHER): Payer: BC Managed Care – PPO | Admitting: Podiatry

## 2022-01-31 ENCOUNTER — Ambulatory Visit (INDEPENDENT_AMBULATORY_CARE_PROVIDER_SITE_OTHER): Payer: BC Managed Care – PPO

## 2022-01-31 ENCOUNTER — Encounter: Payer: Self-pay | Admitting: Podiatry

## 2022-01-31 DIAGNOSIS — G5791 Unspecified mononeuropathy of right lower limb: Secondary | ICD-10-CM

## 2022-01-31 DIAGNOSIS — M7751 Other enthesopathy of right foot: Secondary | ICD-10-CM

## 2022-01-31 DIAGNOSIS — M79671 Pain in right foot: Secondary | ICD-10-CM

## 2022-01-31 MED ORDER — MELOXICAM 15 MG PO TABS: 15.0000 mg | ORAL_TABLET | Freq: Every day | ORAL | 0 refills | Status: DC

## 2022-01-31 NOTE — Progress Notes (Signed)
  Subjective:  Patient ID: Mckenzie Park, female    DOB: 04/11/1976,   MRN: [id number]  Chief Complaint  Patient presents with   Plantar Fasciitis      right foot pain is back    46 y.o. female presents for concern of right foot pain. This appears to be a new pain different than previously a couple years ago. Relates pain after sitting for a while and then standing. Relates pain over the top of her foot and up to her ankle. Relates it starts mostly in the fourth and fifth toes. Relates it has been going on for about a week and denies any injury or treatments.  Relates a burning sensation that shoots up her leg. She does relates a history of fibromyalgia and or back pain.  Denies any other pedal complaints. Denies n/v/f/c.   Past Medical History:  Diagnosis Date   Anxiety    Depression    GERD (gastroesophageal reflux disease)    Heart murmur    irregular heart beat is on medication   Hyperlipidemia    Hypertension    Kidney stone     Objective:  Physical Exam: Vascular: DP/PT pulses 2/4 bilateral. CFT <3 seconds. Normal hair growth on digits. No edema.  Skin. No lacerations or abrasions bilateral feet.  Musculoskeletal: MMT 5/5 bilateral lower extremities in DF, PF, Inversion and Eversion. Deceased ROM in DF of ankle joint. Tender to the fourth interspace on the right as well as over tailors bunion and fourth and fifth TMTJ. Tenderness tracking dorsally to the ankle on the foot. Mild positive tinels sign over lateral dorsal cutaneous nerve.  Neurological: Sensation intact to light touch.   Assessment:   1. Neuritis of right foot   2. Capsulitis of toe of right foot      Plan:  Patient was evaluated and treated and all questions answered. Discussed capsulitis vs neuritis vs radiculopathy and fibromyalgia and etiology as well as treatment with patient.  Radiographs reviewed and discussed with patient. No acute fractures or dislocations Increase in IM 45 angle.  -Discussed  supportive shoes at all times and checking feet regularly.  -Follow-up with PCP for prescription management of gabapentin.  -Discussed topical nerve creams as well.  -Will try meloxicam for a period of time. Sent to pharmacy to see if this calms down area.  -Padding provided to offload forefoot.  -Prescription for capsaicin cream provided.  -Patient to return in 2 months for follow-up evaluation.    Lorenda Peck, DPM

## 2022-02-20 ENCOUNTER — Other Ambulatory Visit: Payer: Self-pay | Admitting: Podiatry

## 2022-03-25 ENCOUNTER — Other Ambulatory Visit: Payer: Self-pay | Admitting: Podiatry

## 2022-03-27 DIAGNOSIS — I10 Essential (primary) hypertension: Secondary | ICD-10-CM | POA: Diagnosis not present

## 2022-03-27 DIAGNOSIS — E559 Vitamin D deficiency, unspecified: Secondary | ICD-10-CM | POA: Diagnosis not present

## 2022-04-01 DIAGNOSIS — J45901 Unspecified asthma with (acute) exacerbation: Secondary | ICD-10-CM | POA: Diagnosis not present

## 2022-04-01 DIAGNOSIS — R82998 Other abnormal findings in urine: Secondary | ICD-10-CM | POA: Diagnosis not present

## 2022-04-01 DIAGNOSIS — Z1339 Encounter for screening examination for other mental health and behavioral disorders: Secondary | ICD-10-CM | POA: Diagnosis not present

## 2022-04-01 DIAGNOSIS — Z0000 Encounter for general adult medical examination without abnormal findings: Secondary | ICD-10-CM | POA: Diagnosis not present

## 2022-04-01 DIAGNOSIS — Z1331 Encounter for screening for depression: Secondary | ICD-10-CM | POA: Diagnosis not present

## 2022-04-01 DIAGNOSIS — I10 Essential (primary) hypertension: Secondary | ICD-10-CM | POA: Diagnosis not present

## 2022-04-03 ENCOUNTER — Ambulatory Visit: Payer: BC Managed Care – PPO | Admitting: Podiatrist

## 2022-04-03 ENCOUNTER — Encounter: Payer: Self-pay | Admitting: Podiatrist

## 2022-04-03 DIAGNOSIS — M722 Plantar fascial fibromatosis: Secondary | ICD-10-CM | POA: Diagnosis not present

## 2022-04-03 DIAGNOSIS — M79604 Pain in right leg: Secondary | ICD-10-CM

## 2022-04-03 DIAGNOSIS — M21621 Bunionette of right foot: Secondary | ICD-10-CM

## 2022-04-03 DIAGNOSIS — M7751 Other enthesopathy of right foot: Secondary | ICD-10-CM | POA: Diagnosis not present

## 2022-04-03 DIAGNOSIS — M545 Low back pain, unspecified: Secondary | ICD-10-CM

## 2022-04-03 MED ORDER — TRIAMCINOLONE ACETONIDE 40 MG/ML IJ SUSP
20.0000 mg | Freq: Once | INTRAMUSCULAR | Status: AC
  Administered 2022-04-03: 20 mg

## 2022-04-03 NOTE — Progress Notes (Signed)
Chief Complaint  Patient presents with   Plantar Fasciitis    Right foot plantar fasciitis      HPI: Patient is 46 y.o. female who presents today for right foot pain that radiates from the fourth and fifth toes down into the bottom of the heel.  She also has pain on the dorsal lateral side of the right foot that is bothersome.  She has tried various shoes.  She has found that sketchers with memory foam are comfortable for a while. She has [employer] and her husband recently bought her [employer] shoes.  She relates her feet hurt regardless of the shoes she wears. She has pain with the first step in the morning that goes from her plantar heel up to the toes.  She also relates she has a back pain and relates a burning type pain down her back and leg.  She has been diagnosed with fibromyalgia and is currently taking gabapentin for this.  She relates she tried the meloxicam but doesn't recall it doing much for her foot pain.  In the past she had resistant plantar fasciitis on the left which required boot immobilization.  The left foot is doing well today.    Patient Active Problem List   Diagnosis Date Noted   Chronic migraine without aura without status migrainosus, not intractable 08/01/2015   Essential hypertension 08/01/2015   Depression 08/01/2015    Current Outpatient Medications on File Prior to Visit  Medication Sig Dispense Refill   albuterol (VENTOLIN HFA) 108 (90 Base) MCG/ACT inhaler Inhale 2 puffs into the lungs every 6 (six) hours as needed for wheezing or shortness of breath.     ALPRAZolam (XANAX) 0.25 MG tablet Take 1 tablet by mouth in the morning, at noon, and at bedtime. As needed for Panic /Anxiety     ALPRAZolam (XANAX) 0.5 MG tablet Take 0.5 mg by mouth 3 (three) times daily as needed.     aspirin 81 MG EC tablet Take 1 tablet by mouth daily.     Cholecalciferol (D-3-5) 125 MCG (5000 UT) capsule Take 5,000 Units by mouth daily.     escitalopram (LEXAPRO) 10 MG tablet Take 1  tablet by mouth daily.     losartan (COZAAR) 100 MG tablet Take 1 tablet (100 mg total) by mouth every evening. 90 tablet 3   meloxicam (MOBIC) 15 MG tablet Take 1 tablet by mouth once daily 30 tablet 0   nitroGLYCERIN (NITROSTAT) 0.4 MG SL tablet Place 1 tablet (0.4 mg total) under the tongue every 5 (five) minutes as needed for up to 25 days for chest pain. 25 tablet 3   propranolol (INDERAL) 20 MG tablet Take 1 tablet (20 mg total) by mouth 3 (three) times daily. Take extra tablet if SBP > 150 or or anxiety 280 tablet 1   rosuvastatin (CRESTOR) 5 MG tablet Take 5 mg by mouth at bedtime.     No current facility-administered medications on file prior to visit.    No Known Allergies  Review of Systems No fevers, chills, nausea, muscle aches, no difficulty breathing, no calf pain, no chest pain or shortness of breath.   Physical Exam  GENERAL APPEARANCE: Alert, conversant. Appropriately groomed. No acute distress.   VASCULAR: Pedal pulses palpable 2/4 DP and 2/4 PT right.   Capillary refill time is immediate to all digits,  Proximal to distal cooling is warm to warm.  Digital perfusion adequate.   NEUROLOGIC: sensation is intact to 5.07 monofilament at 5/5 sites  right.    MUSCULOSKELETAL: acceptable muscle strength, tone and stability bilateral. Pain on the plantar central and plantar lateral right heel is noted. This is reported as the most painful area when palpated.  Discomfort submetatarsal 4/5 also noted.  Palpable click fourth interspace noted, but not bothersome to patient.  Pain on tailors bunion prominence noted as well.    DERMATOLOGIC: skin is warm, supple, and dry.  Color, texture, and turgor of skin within normal limits.   Xrays reviewed- right foot-  3 view-.  Inferior calcaneal spur is present.  Tailors bunion present,  no acute osseous pathology noted.  No joint abnormalities seen.  Soft tissue structures appear normal.    Assessment     ICD-10-CM   1. Plantar  fasciitis of right foot  M72.2     2. Capsulitis of metatarsophalangeal (MTP) joint of right foot  M77.51     3. Tailor's bunionette, right  M21.621        Plan  Discussed exam findings with the patient.  It appears she mainly has heel pain and is likely compensating and walking on the outside of the foot.  I recommended an injection today and she agreed. This was carried out with '20mg'$  kenalog and 0.5% marcaine plain - on the plantar lateral foot.  She tolerated this well.  I also immobilized her in a short air fracture walker and advised she wear this while at work.  Her back pain is also a concern as well as potential sciatica concerns,  therefore a neurology consult was ordered for her.  She will be seen back with me in a month for follow up of her pain and to see how she is progressing.    Procedure: Injection right Discussed alternatives, risks, complications and verbal consent was obtained.  Location: plantar lateral right heel Skin Prep: Alcohol. Injectate: 1cc 0.5% marcaine plain, 0.5cc 40 mg Kenalog Disposition: Patient tolerated procedure well. Injection site dressed with a band-aid.  Post-injection care was discussed and return precautions discussed.

## 2022-04-16 ENCOUNTER — Encounter: Payer: Self-pay | Admitting: Cardiology

## 2022-04-16 ENCOUNTER — Ambulatory Visit: Payer: BC Managed Care – PPO | Admitting: Cardiology

## 2022-04-16 VITALS — BP 136/80 | HR 81 | Temp 97.6°F | Resp 16 | Ht 66.0 in | Wt 232.2 lb

## 2022-04-16 DIAGNOSIS — I10 Essential (primary) hypertension: Secondary | ICD-10-CM

## 2022-04-16 DIAGNOSIS — E7800 Pure hypercholesterolemia, unspecified: Secondary | ICD-10-CM

## 2022-04-16 DIAGNOSIS — K219 Gastro-esophageal reflux disease without esophagitis: Secondary | ICD-10-CM

## 2022-04-16 DIAGNOSIS — R002 Palpitations: Secondary | ICD-10-CM | POA: Diagnosis not present

## 2022-04-16 DIAGNOSIS — R052 Subacute cough: Secondary | ICD-10-CM | POA: Diagnosis not present

## 2022-04-16 MED ORDER — GUAIFENESIN-CODEINE 200-10 MG/5ML PO LIQD: 10.0000 mL | Freq: Three times a day (TID) | ORAL | 0 refills | Status: DC

## 2022-04-16 MED ORDER — ROSUVASTATIN CALCIUM 10 MG PO TABS: 5.0000 mg | ORAL_TABLET | Freq: Every day | ORAL | 0 refills | Status: DC

## 2022-04-16 MED ORDER — OMEPRAZOLE 40 MG PO CPDR: 40.0000 mg | DELAYED_RELEASE_CAPSULE | Freq: Every day | ORAL | 1 refills | Status: DC

## 2022-04-16 MED ORDER — EZETIMIBE 10 MG PO TABS: 10.0000 mg | ORAL_TABLET | Freq: Every evening | ORAL | 2 refills | Status: DC

## 2022-04-16 NOTE — Progress Notes (Signed)
Primary Physician/Referring:  Mckenzie Hatchet, MD  Patient ID: Mckenzie Park, female    DOB: 24-Nov-1975, 46 y.o.   MRN: [id number]  Chief Complaint  Patient presents with   Hypertension   Hyperlipidemia    HPI:    Mckenzie Park  is a 46 y.o. Caucasian female with hypertension, fibromyalgia, morbid obesity, family history of aortic dissection in her brother at age 42 died suddenly with aortic dissection, known to have CAD as well.  In view of her family history of premature coronary artery disease, father having had aortic valve disease and aortic valve replacement, she had normal coronary arteries and calcium score of 0 by CTA of the coronaries on 11/11/2019 with no significant aortic abnormality.    She was referred back to me for evaluation of hyperlipidemia, low heart rate, to discuss family history of coronary artery disease and aortic dissection.  Patient has developed a cough for the past 6 weeks, was initially treated with steroids with improvement in symptoms however has recurred.  She has also noticed her heart rate sometimes to be in 45-50 range.  No dizziness or syncope.  She has not had any further chest pain, denies dyspnea or leg edema.  Past Medical History:  Diagnosis Date   Anxiety    Depression    GERD (gastroesophageal reflux disease)    Heart murmur    irregular heart beat is on medication   Hyperlipidemia    Hypertension    Kidney stone    Past Surgical History:  Procedure Laterality Date   ABDOMINAL HYSTERECTOMY     CHOLECYSTECTOMY     kidney stone removal     KNEE ARTHROSCOPY Bilateral    TUBAL LIGATION     Social History   Tobacco Use   Smoking status: Never   Smokeless tobacco: Never  Substance Use Topics   Alcohol use: Yes    Comment: occasionally    Marital Status: Married  ROS  Review of Systems  Cardiovascular:  Positive for palpitations. Negative for chest pain, dyspnea on exertion and leg swelling.  Respiratory:  Positive for cough.     Objective      04/16/2022   10:32 AM 08/13/2021    9:19 AM 08/13/2021    9:09 AM  Vitals with BMI  Height '5\' 6"'$     Weight 232 lbs 3 oz    BMI 68.1    Systolic 157 262 035  Diastolic 80 70 69  Pulse 81 50 52    Blood pressure 136/80, pulse 81, temperature 97.6 F (36.4 C), temperature source Temporal, resp. rate 16, height '5\' 6"'$  (1.676 m), weight 232 lb 3.2 oz (105.3 kg). Body mass index is 37.48 kg/m.   Physical Exam Constitutional:      Appearance: She is morbidly obese.  Neck:     Vascular: No carotid bruit or JVD.  Cardiovascular:     Rate and Rhythm: Normal rate and regular rhythm.     Pulses: Intact distal pulses.     Heart sounds: Normal heart sounds. No murmur heard.    No gallop.  Pulmonary:     Effort: Pulmonary effort is normal.     Breath sounds: Normal breath sounds.  Abdominal:     General: Bowel sounds are normal.     Palpations: Abdomen is soft.  Musculoskeletal:     Right lower leg: No edema.     Left lower leg: No edema.    Radiology: No results found.  Laboratory examination:  External labs:   Cholesterol, total 203.000 m 04/01/2022 HDL 35.000 mg 04/01/2022 LDL 147.000 m 04/01/2022 Triglycerides 107.000 m 04/01/2022  Hemoglobin 13.500 g/d 05/28/2021  Creatinine, Serum 0.560 mg/ 05/28/2021  Potassium 3.700 mEq 04/01/2022 ALT (SGPT) 14.000 IU/ 04/01/2022  TSH 2.570 04/01/2022  Cholesterol, total 228.000 m 03/15/2021 HDL 43.000 mg 03/15/2021 LDL 168.000 m 03/15/2021 Triglycerides 87.000 mg 03/15/2021  Medications   Current Outpatient Medications:    albuterol (VENTOLIN HFA) 108 (90 Base) MCG/ACT inhaler, Inhale 2 puffs into the lungs every 6 (six) hours as needed for wheezing or shortness of breath., Disp: , Rfl:    ALPRAZolam (XANAX) 0.25 MG tablet, Take 1 tablet by mouth in the morning, at noon, and at bedtime. As needed for Panic /Anxiety, Disp: , Rfl:    Cholecalciferol (D-3-5) 125 MCG (5000 UT) capsule, Take 5,000 Units by mouth  daily., Disp: , Rfl:    escitalopram (LEXAPRO) 10 MG tablet, Take 1 tablet by mouth daily., Disp: , Rfl:    ezetimibe (ZETIA) 10 MG tablet, Take 1 tablet (10 mg total) by mouth every evening., Disp: 30 tablet, Rfl: 2   gabapentin (NEURONTIN) 300 MG capsule, Take 300 mg by mouth at bedtime., Disp: , Rfl:    guaiFENesin-Codeine 200-10 MG/5ML LIQD, Take 10 mLs by mouth in the morning, at noon, and at bedtime., Disp: 210 mL, Rfl: 0   losartan (COZAAR) 100 MG tablet, Take 1 tablet (100 mg total) by mouth every evening., Disp: 90 tablet, Rfl: 3   meloxicam (MOBIC) 15 MG tablet, Take 1 tablet by mouth once daily, Disp: 30 tablet, Rfl: 0   nitroGLYCERIN (NITROSTAT) 0.4 MG SL tablet, Place 1 tablet (0.4 mg total) under the tongue every 5 (five) minutes as needed for up to 25 days for chest pain., Disp: 25 tablet, Rfl: 3   omeprazole (PRILOSEC) 40 MG capsule, Take 1 capsule (40 mg total) by mouth daily before breakfast., Disp: 30 capsule, Rfl: 1   propranolol (INDERAL) 20 MG tablet, Take 1 tablet (20 mg total) by mouth 3 (three) times daily. Take extra tablet if SBP > 150 or or anxiety, Disp: 280 tablet, Rfl: 1   topiramate (TOPAMAX) 25 MG capsule, Take 25 mg by mouth daily., Disp: , Rfl:    rosuvastatin (CRESTOR) 10 MG tablet, Take 0.5 tablets (5 mg total) by mouth at bedtime., Disp: 90 tablet, Rfl: 0  Cardiac Studies:   Treadmill stress test   08/07/2017: Indication: Chest pain The patient exercised on Bruce protocol for 09:12 min. Patient achieved 10.46 METS and reached HR 150 bpm, which is 84 % of maximum age-predicted HR. Stress test terminated due to light headed , SOB, tightness of chest and chest painExercise capacity was normal.  HR Response to Exercise: Appropriate. BP Response to Exercise: Normal resting BP- appropriate response. Chest Pain: limiting. Arrhythmias: none. ST Changes: With peak exercise there was no ST-T changes of ischemia.  Overall Impression: Normal stress test. Continue  primary/secondary prevention.  Echocardiogram  [11/11/2016]: Left ventricle cavity is normal in size. Normal global wall motion. Normal diastolic filling pattern, normal LAP. Calculated EF 60%. Left atrial cavity is mildly dilated at 4.1 cm. Mild (Grade I) to at most mild to moderate posteriorly directed mitral regurgitation. Mild to moderate tricuspid regurgitation. No evidence of pulmonary hypertension. Compared to the study done on 04/11/2015, no significant change. PA pressure was previously measured at 35 mmHg.  Holter Monitor  [04/06/2015]: Patient's symptoms of dizziness correlated with sinus bradycardia at the rate of 47-49 bpm.  Episodes of chest pain and shortness of breath and palpitations revealed normal sinus rhythm. Asymptomatic episodes of ventricular bigeminy, 3-4 beat runs of atrial tachycardia was evident. One episode off what appears to be brief episode off probable idioventricular rhythm (wide-complex rhythm at the rate of 81 bpm. 2 episodes lasted for less than 1 minute at 4:35 AM). Consider sleep apnea related rhythm disturbance.  Coronary CT morphology 11/11/2019: Coronary calcium score: The patient's coronary artery calcium score is 0, which places the patient in the 0 percentile. Coronary arteries: Normal coronary origins.  Right dominance. No significant incidental noncardiac findings. Normal thoracic aorta.  EKG:  EKG 04/16/2022: Normal sinus rhythm with rate of 74 bpm, normal axis, no evidence of ischemia, normal EKG.  No change from 04/01/2021.  Previously heart rate was 48 bpm.  Assessment     ICD-10-CM   1. Essential hypertension  I10 EKG 12-Lead    2. Palpitations  R00.2     3. Pure hypercholesterolemia  E78.00 rosuvastatin (CRESTOR) 10 MG tablet    ezetimibe (ZETIA) 10 MG tablet    4. Subacute cough  R05.2 omeprazole (PRILOSEC) 40 MG capsule    guaiFENesin-Codeine 200-10 MG/5ML LIQD    5. Gastroesophageal reflux disease without esophagitis  K21.9  omeprazole (PRILOSEC) 40 MG capsule    guaiFENesin-Codeine 200-10 MG/5ML LIQD       Recommendations:   Litsy Epting  is a 46 y.o. Caucasian female with hypertension, fibromyalgia, morbid obesity, family history of aortic dissection in her brother at age 8 died suddenly with aortic dissection, known to have CAD as well.  In view of her family history of premature coronary artery disease, father having had aortic valve disease and aortic valve replacement, she had normal coronary arteries and calcium score of 0 by CTA of the coronaries on 11/11/2019 with no significant aortic abnormality.    1. Essential hypertension Blood pressure is well-controlled on present medical regimen, presently on losartan, especially in view of family history of aortic dissection, this is also a great choice.  Patient's CT scan that was performed previously did not reveal any evidence of aortic root dilatation or ascending aortic aneurysm hence her chances of developing aortic aneurysm is very remote.  Patient's brother was also a heavy smoker and also hypertensive and in poor health overall.  Hence I reassured her.  2. Palpitations Symptoms of palpitations have remained stable since being on a low-dose of a beta-blocker.  Continue the same.  Although heart rate sometimes is in the 40s to 50s, patient has had low heart rate in the past as well along with the use of beta-blockers.  She is only 46 years of age hence no clinical significance for bradycardia.  3. Pure hypercholesterolemia Lipids reviewed, she is only on minimal dose of statins in view of fibromyalgia.  I would increase the dose to 10 mg of Crestor, add Zetia 10 mg daily, goal LDL <100 in view of coronary calcium score of 0.  Weight loss will tremendously help.  She is presently on topiramate for the same.  I have discussed with her regarding various bracing which she could avoid calorie intake.  4. Subacute cough She has had subacute cough ongoing for the  past 6 weeks, on further delineation, suspect she probably has significant GERD as an etiology.  She has body habitus that is conducive for GERD, I would like to try PPI along with suppression of cough with codeine.  5. Gastroesophageal reflux disease without esophagitis As discussed above.  If cough does not resolve, consider GI evaluation.    This is a 40-minute office visit encounter.   Adrian Prows, MD, [facility] 04/16/2022, 10:37 AM Office: [phone]

## 2022-04-24 ENCOUNTER — Other Ambulatory Visit: Payer: Self-pay | Admitting: Podiatrist

## 2022-05-01 ENCOUNTER — Ambulatory Visit: Payer: BC Managed Care – PPO | Admitting: Podiatrist

## 2022-05-08 ENCOUNTER — Ambulatory Visit: Payer: BC Managed Care – PPO | Admitting: Podiatrist

## 2022-05-08 ENCOUNTER — Encounter: Payer: Self-pay | Admitting: Podiatrist

## 2022-05-08 DIAGNOSIS — N393 Stress incontinence (female) (male): Secondary | ICD-10-CM | POA: Diagnosis not present

## 2022-05-08 DIAGNOSIS — N3281 Overactive bladder: Secondary | ICD-10-CM | POA: Diagnosis not present

## 2022-05-08 DIAGNOSIS — D2121 Benign neoplasm of connective and other soft tissue of right lower limb, including hip: Secondary | ICD-10-CM

## 2022-05-08 MED ORDER — TRIAMCINOLONE ACETONIDE 10 MG/ML IJ SUSP
10.0000 mg | Freq: Once | INTRAMUSCULAR | Status: AC
  Administered 2022-05-08: 10 mg

## 2022-05-08 NOTE — Progress Notes (Signed)
   Chief Complaint  Patient presents with   Plantar Fasciitis    Pf Follow up of the right foot - patient states pain is still the same      HPI: Patient is 46 y.o. female who presents today for follow-up of plantar lateral right foot pain.  She relates she can feel a knot in the area of maximal tenderness which did not improve after the last visit/injection.  She relates she tried to wear the boot however when she wore it she could feel the knot despite layers of padding that she was trying to protect the heel with.  In general she has had minimal improvement.  Last visit we did an injection and a steroid taper which she did take.   No Known Allergies  Review of systems is negative except as noted in the HPI.  Denies nausea/ vomiting/ fevers/ chills or night sweats.   Denies difficulty breathing, denies calf pain or tenderness  Physical Exam  Patient is awake, alert, and oriented x 3.  In no acute distress.    Vascular status is intact with palpable pedal pulses DP and PT bilateral and capillary refill time less than 3 seconds bilateral.  No edema or erythema noted.   Neurological exam reveals epicritic and protective sensation grossly intact bilateral.   Dermatological exam reveals skin is supple and dry to bilateral feet.  No open lesions present.    Musculoskeletal exam: Pain on palpation plantar lateral aspect of the right heel is noted with a palpable fibromatous knot noted.  Direct pain at this area is encountered.   Assessment:   ICD-10-CM   1. Fibroma of right foot  D21.21        Plan: Discussed exam findings with the patient and we also discussed x-rays.  She is concerned that she has a bone spur on the plantar on the heel and wonders if this is causing her pain.  I discussed that she does have a very small bone spur however generally these are not the cause of the pain.  Recommend against having this shaved down removed.  I did however discuss that she has a palpable  soft tissue fibroma on the plantar lateral aspect of the foot and that we could try steroid in the area to see if we can shrink the tissue.  She did agree I prepped the skin with alcohol infiltrated 10 mg Kenalog with Marcaine plain into the fibroma itself.  She tolerated this well.  I also recommended a different type of boot she can get off [place] called an [place] rebound boot for her to try.  In the past she has had similar problems and relates that the boot immobilization was beneficial for her.  We discussed possible physical therapy in the future however at this time she would like to hold off to see how well the injection does.  She will return in 1 month for recheck and will call sooner if any questions arise.

## 2022-05-08 NOTE — Patient Instructions (Signed)
Try toe [place] Dealer with compression.    You can get a high top or a low top boot off [place] and be sure to check sizing and return policy in case it doesn't fit.

## 2022-05-21 ENCOUNTER — Other Ambulatory Visit: Payer: Self-pay | Admitting: Podiatrist

## 2022-05-22 DIAGNOSIS — I10 Essential (primary) hypertension: Secondary | ICD-10-CM | POA: Diagnosis not present

## 2022-05-22 DIAGNOSIS — Z7982 Long term (current) use of aspirin: Secondary | ICD-10-CM | POA: Diagnosis not present

## 2022-05-22 DIAGNOSIS — Z87442 Personal history of urinary calculi: Secondary | ICD-10-CM | POA: Diagnosis not present

## 2022-05-22 DIAGNOSIS — K5900 Constipation, unspecified: Secondary | ICD-10-CM | POA: Diagnosis not present

## 2022-05-22 DIAGNOSIS — N200 Calculus of kidney: Secondary | ICD-10-CM | POA: Diagnosis not present

## 2022-05-22 DIAGNOSIS — K219 Gastro-esophageal reflux disease without esophagitis: Secondary | ICD-10-CM | POA: Diagnosis not present

## 2022-05-22 DIAGNOSIS — R1031 Right lower quadrant pain: Secondary | ICD-10-CM | POA: Diagnosis not present

## 2022-05-22 DIAGNOSIS — R109 Unspecified abdominal pain: Secondary | ICD-10-CM | POA: Diagnosis not present

## 2022-05-22 DIAGNOSIS — Z79899 Other long term (current) drug therapy: Secondary | ICD-10-CM | POA: Diagnosis not present

## 2022-05-22 DIAGNOSIS — Z9049 Acquired absence of other specified parts of digestive tract: Secondary | ICD-10-CM | POA: Diagnosis not present

## 2022-05-22 DIAGNOSIS — M549 Dorsalgia, unspecified: Secondary | ICD-10-CM | POA: Diagnosis not present

## 2022-05-22 DIAGNOSIS — R1032 Left lower quadrant pain: Secondary | ICD-10-CM | POA: Diagnosis not present

## 2022-05-28 ENCOUNTER — Encounter: Payer: Self-pay | Admitting: Internal Medicine

## 2022-05-28 DIAGNOSIS — Z1231 Encounter for screening mammogram for malignant neoplasm of breast: Secondary | ICD-10-CM

## 2022-05-28 IMAGING — MG DIGITAL DIAGNOSTIC BILAT W/ TOMO W/ CAD
8 of 15 series · 8 of 40 positions shown · non-contrast
Comparison: Previous exam(s).

ACR Breast Density Category a: The breast tissue is almost entirely
fatty.

CLINICAL DATA: 44-year-old female with diffuse, intermittent
bilateral breast pain.

EXAM:
DIGITAL DIAGNOSTIC BILATERAL MAMMOGRAM WITH TOMOSYNTHESIS AND CAD
TECHNIQUE: Bilateral digital diagnostic mammography and breast tomosynthesis
was performed. The images were evaluated with computer-aided
detection.

[R MLO synth-2D (1 of 2)]
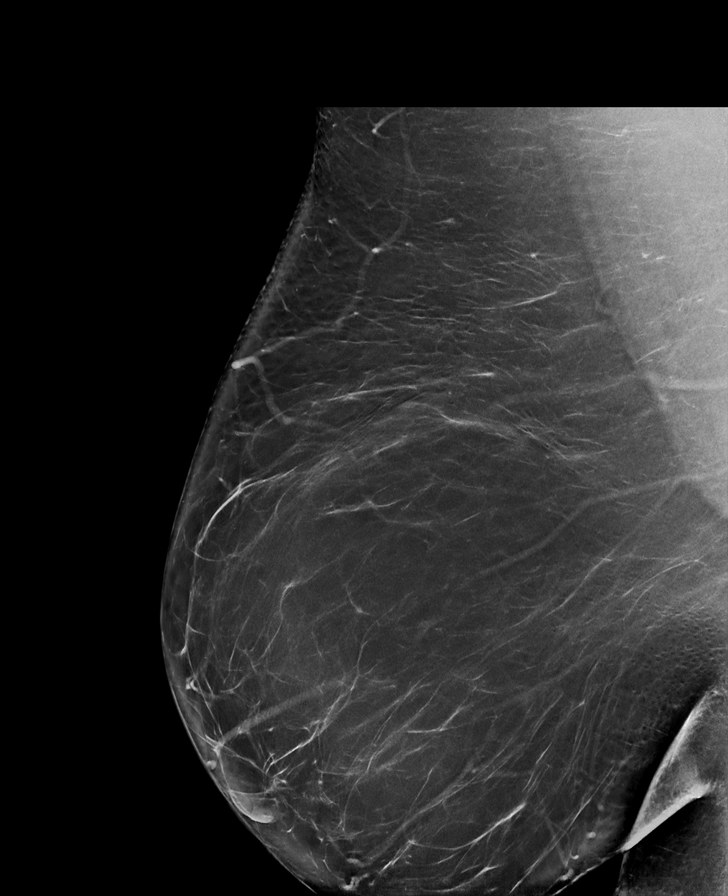

[R CC synth-2D (1 of 2)]
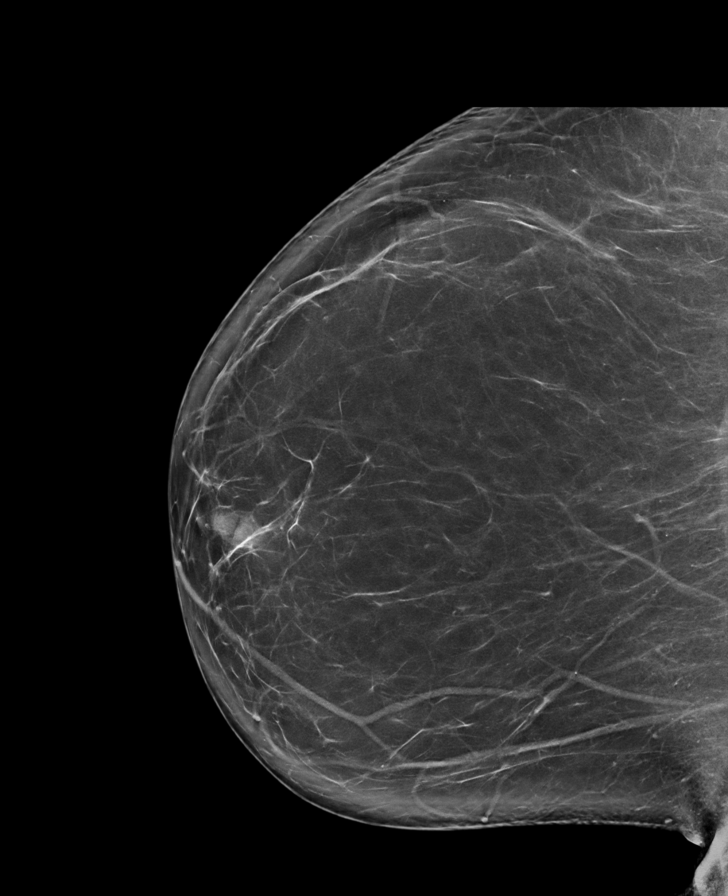

[R CC synth-2D (2 of 2)]
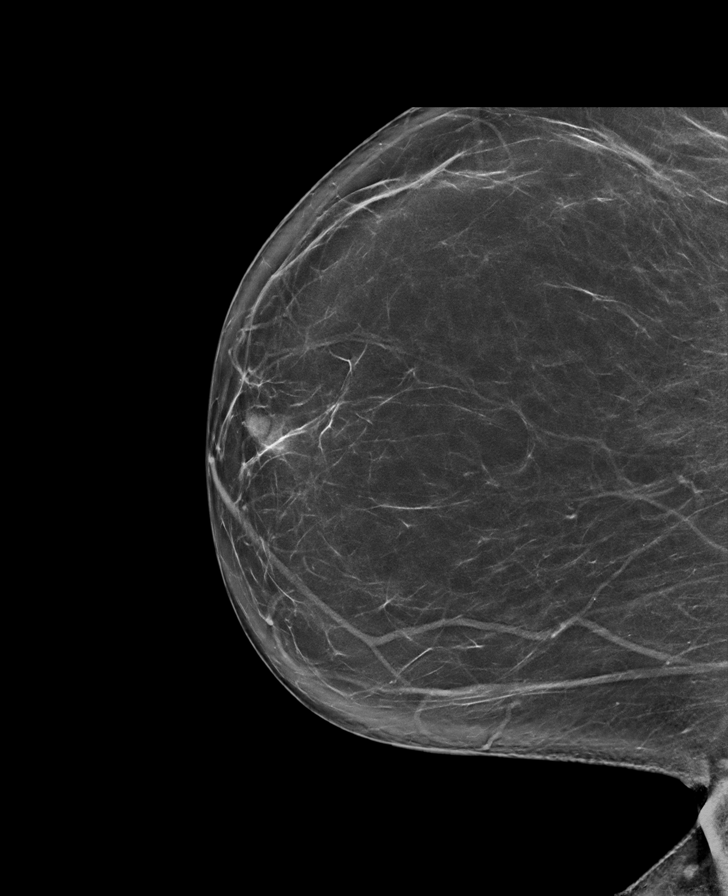

[R MLO synth-2D (2 of 2)]
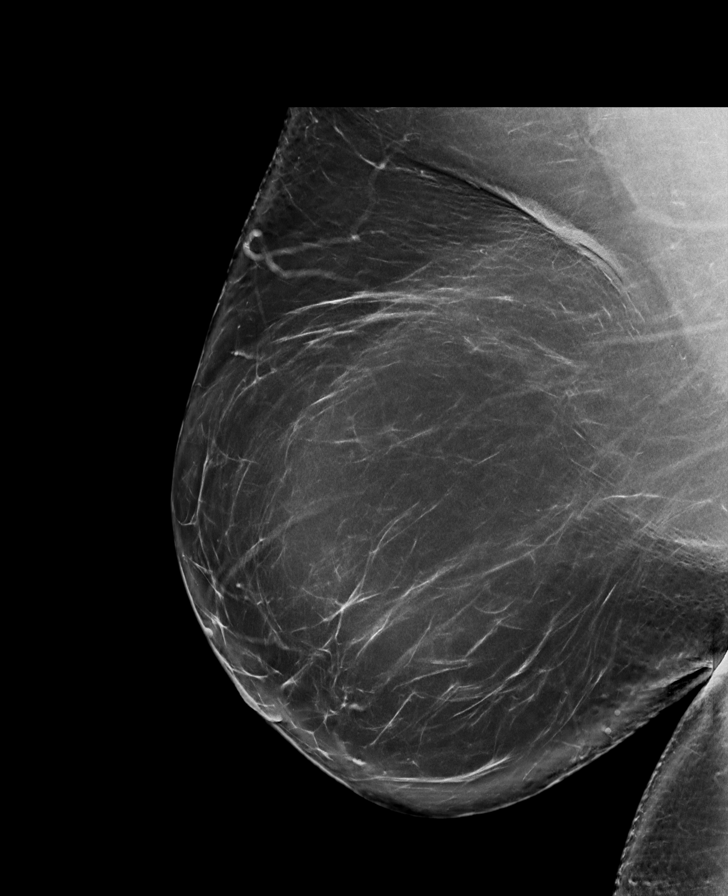

[L CC synth-2D]
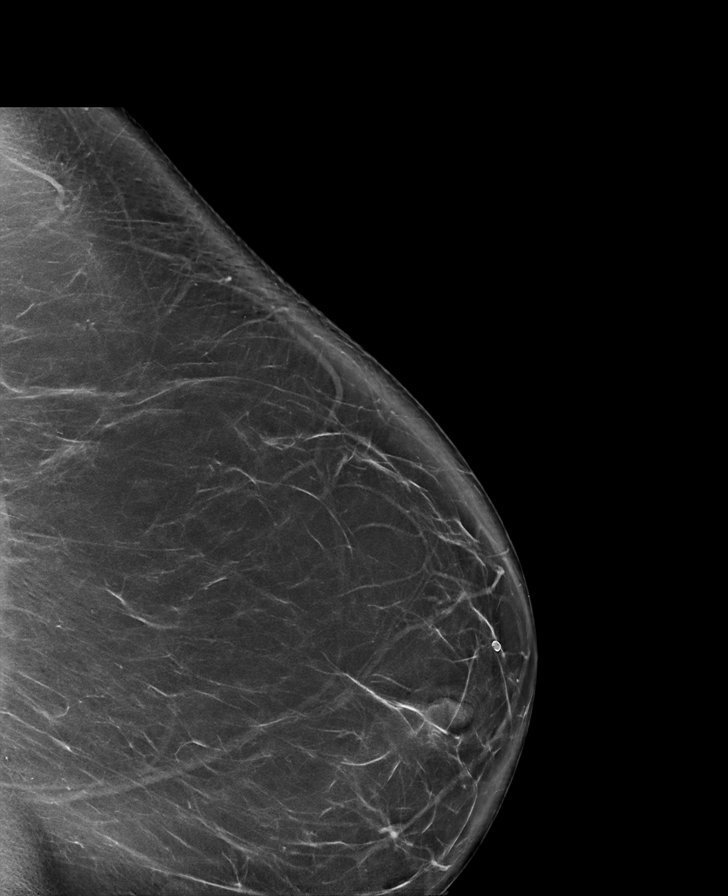

[L MLO synth-2D (1 of 2)]
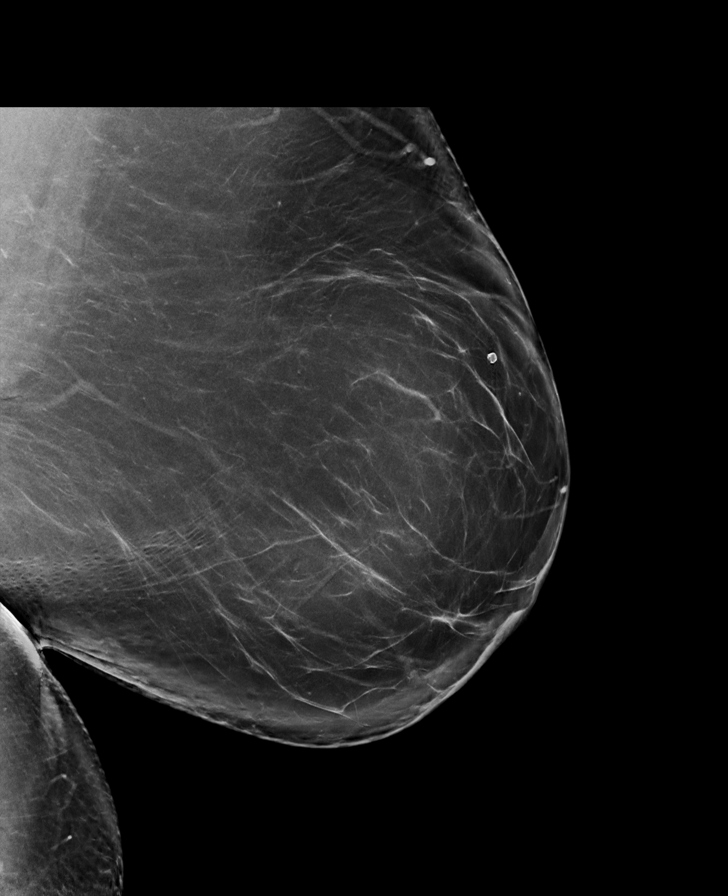

[L MLO synth-2D (2 of 2)]
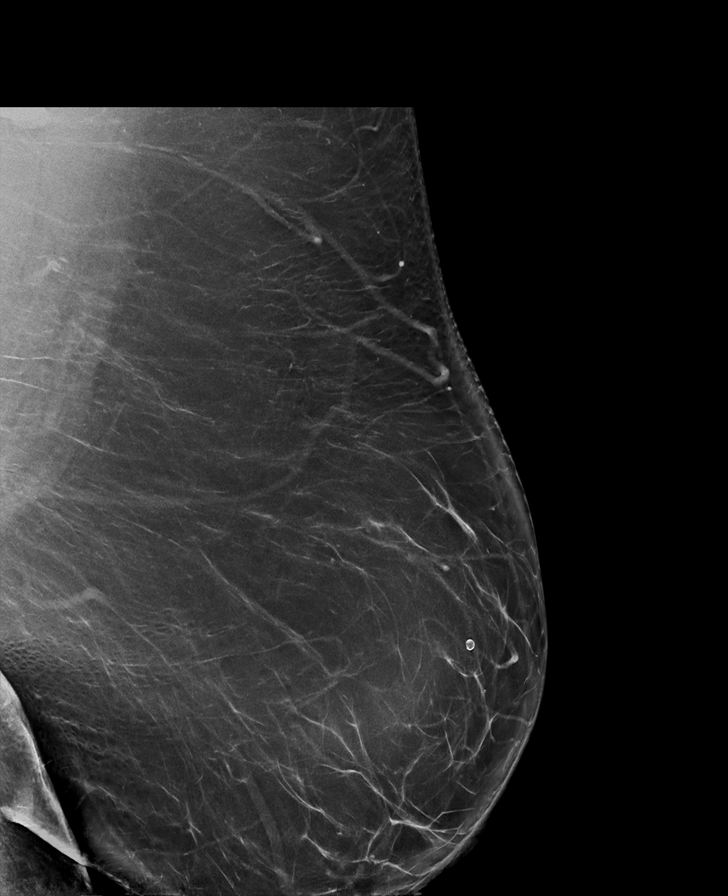

[L MLO tomo · tomo slice 74/108.0]
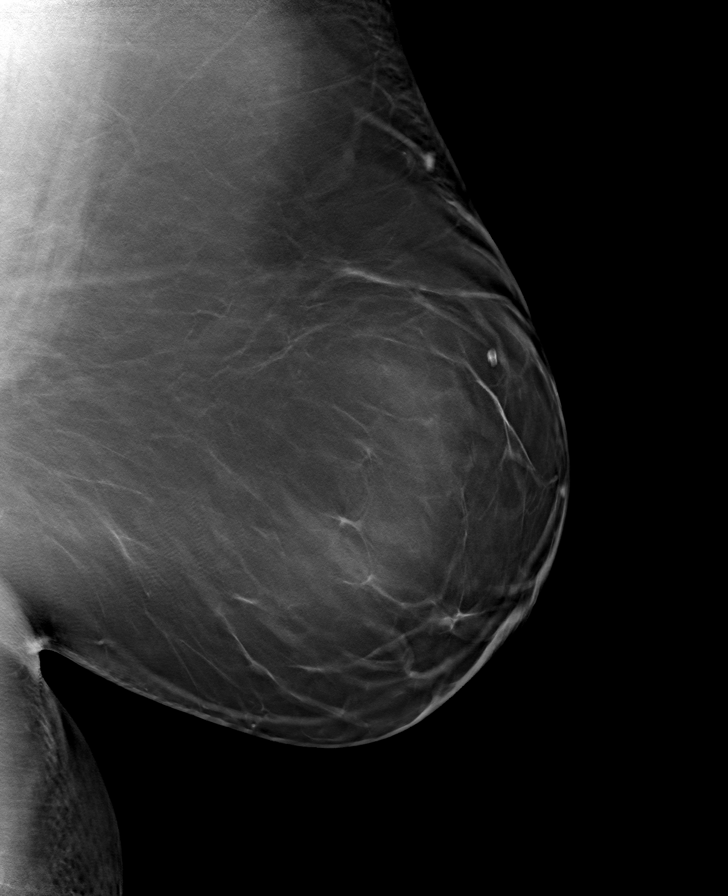

[8 of 40 positions shown; findings below may reference images not displayed]

FINDINGS: No focal or suspicious mammographic findings are identified in
either breast. The parenchymal pattern is stable.
IMPRESSION: No mammographic evidence of malignancy in either breast.

RECOMMENDATION:
1. Clinical follow-up recommended for the diffusely painful area of
concern in the bilateral breasts. Any further workup should be based
on clinical grounds. Benign causes of breast pain, and possible
remedies, were discussed with the patient. Patient was encouraged to
follow-up with referring physician if pain became localized and
persistent or if a palpable lump/mass developed.
2.  Screening mammogram in one year.(Code:[id number])

I have discussed the findings and recommendations with the patient.
If applicable, a reminder letter will be sent to the patient
regarding the next appointment.

BI-RADS CATEGORY  1: Negative.

## 2022-06-07 ENCOUNTER — Other Ambulatory Visit: Payer: Self-pay | Admitting: Cardiology

## 2022-06-07 DIAGNOSIS — K219 Gastro-esophageal reflux disease without esophagitis: Secondary | ICD-10-CM

## 2022-06-07 DIAGNOSIS — R052 Subacute cough: Secondary | ICD-10-CM

## 2022-06-12 ENCOUNTER — Ambulatory Visit: Payer: BC Managed Care – PPO | Admitting: Podiatry

## 2022-06-12 ENCOUNTER — Encounter: Payer: Self-pay | Admitting: Podiatry

## 2022-06-12 DIAGNOSIS — M722 Plantar fascial fibromatosis: Secondary | ICD-10-CM

## 2022-06-12 DIAGNOSIS — G5791 Unspecified mononeuropathy of right lower limb: Secondary | ICD-10-CM

## 2022-06-12 DIAGNOSIS — D2121 Benign neoplasm of connective and other soft tissue of right lower limb, including hip: Secondary | ICD-10-CM

## 2022-06-12 MED ORDER — DEXAMETHASONE SODIUM PHOSPHATE 120 MG/30ML IJ SOLN
4.0000 mg | Freq: Once | INTRAMUSCULAR | Status: AC
  Administered 2022-06-12: 4 mg via INTRA_ARTICULAR

## 2022-06-12 NOTE — Progress Notes (Signed)
   Chief Complaint  Patient presents with   Follow-up    Fibroma of right foot      HPI: Patient is 47 y.o. female who presents today for follow-up of plantar lateral right foot pain and fibroma on the right foot. Relates now she is starting to get pain in the left heel.  Relates the previous injection into fibroma was a little bit helpful although still has the pain. States she has been using the boot Dr. Valentina Lucks recommended and but relates she feels it does not have enough cushion. States she is still getting the burning pain in the little toes every now and then. She does have a history of back pain and firbomyalgia. Seems that pain has been moving around with each visit and when one thing hurts she compensates and other areas of her feet start to hurt.   No Known Allergies  Review of systems is negative except as noted in the HPI.  Denies nausea/ vomiting/ fevers/ chills or night sweats.   Denies difficulty breathing, denies calf pain or tenderness  Physical Exam  Patient is awake, alert, and oriented x 3.  In no acute distress.    Vascular status is intact with palpable pedal pulses DP and PT bilateral and capillary refill time less than 3 seconds bilateral.  No edema or erythema noted.   Neurological exam reveals epicritic and protective sensation grossly intact bilateral.   Dermatological exam reveals skin is supple and dry to bilateral feet.  No open lesions present.    Musculoskeletal exam: Pain on palpation plantar lateral aspect of the right heel is noted with a palpable fibromatous knot noted. Difficult to feel so suspect it has reduced since last injection.  Direct pain at this area is encountered.   Assessment:   ICD-10-CM   1. Plantar fasciitis of right foot  M72.2 MR HEEL RIGHT WO CONTRAST    dexamethasone (DECADRON) injection 4 mg    2. Fibroma of right foot  D21.21 MR HEEL RIGHT WO CONTRAST    dexamethasone (DECADRON) injection 4 mg    3. Neuritis of right foot   G57.91 MR HEEL RIGHT WO CONTRAST        Plan: Discussed exam findings with the patient and we also discussed x-rays.  Discussed plantar fibromas and plantar fasciitis in the setting of nerve pains and fibromyalgia.  Discussed trying another injection as they have been helpful. Procedure below.  Continue in boot as needed and heel cups dispensed to give some extra cushion.  At this time discussed ordering and MRI to elucidate the crux of her problem and for potential surgical planning.  Also discussed talking to PCP about possibly stopping gabapentin and trying Lyrica or duloxetine to see if this helps with some of the nerve pain she is having.  She is going to have bladder surgery soon and this potentially will help with time off her feet. Did discuss that if we cannot solve this pain with treatment methods so far she may have to consider a job were she can sit instead of being on concrete floors all day.  Patient to return after MRI to discuss.   Procedure: Injection Tendon/Ligament Discussed alternatives, risks, complications and verbal consent was obtained.  Location: Right heel. Skin Prep: Alcohol. Injectate: 1cc 0.5% marcaine plain, 1 cc dexamethasone.  Disposition: Patient tolerated procedure well. Injection site dressed with a band-aid.  Post-injection care was discussed and return precautions discussed.

## 2022-06-24 ENCOUNTER — Other Ambulatory Visit: Payer: Self-pay | Admitting: Podiatrist

## 2022-06-24 DIAGNOSIS — K219 Gastro-esophageal reflux disease without esophagitis: Secondary | ICD-10-CM | POA: Diagnosis not present

## 2022-06-24 DIAGNOSIS — Z6841 Body Mass Index (BMI) 40.0 and over, adult: Secondary | ICD-10-CM | POA: Diagnosis not present

## 2022-06-24 DIAGNOSIS — I10 Essential (primary) hypertension: Secondary | ICD-10-CM | POA: Diagnosis not present

## 2022-06-24 DIAGNOSIS — N393 Stress incontinence (female) (male): Secondary | ICD-10-CM | POA: Diagnosis not present

## 2022-06-24 DIAGNOSIS — Z7982 Long term (current) use of aspirin: Secondary | ICD-10-CM | POA: Diagnosis not present

## 2022-06-24 DIAGNOSIS — Z79899 Other long term (current) drug therapy: Secondary | ICD-10-CM | POA: Diagnosis not present

## 2022-07-04 DIAGNOSIS — M5441 Lumbago with sciatica, right side: Secondary | ICD-10-CM | POA: Diagnosis not present

## 2022-07-04 DIAGNOSIS — M797 Fibromyalgia: Secondary | ICD-10-CM | POA: Diagnosis not present

## 2022-07-04 DIAGNOSIS — F330 Major depressive disorder, recurrent, mild: Secondary | ICD-10-CM | POA: Diagnosis not present

## 2022-07-04 DIAGNOSIS — F419 Anxiety disorder, unspecified: Secondary | ICD-10-CM | POA: Diagnosis not present

## 2022-07-04 DIAGNOSIS — I10 Essential (primary) hypertension: Secondary | ICD-10-CM | POA: Diagnosis not present

## 2022-07-04 DIAGNOSIS — M5442 Lumbago with sciatica, left side: Secondary | ICD-10-CM | POA: Diagnosis not present

## 2022-07-05 ENCOUNTER — Ambulatory Visit
Admission: RE | Admit: 2022-07-05 | Discharge: 2022-07-05 | Disposition: A | Discharge status: Home / Self Care | Payer: BC Managed Care – PPO | Source: Ambulatory Visit | Attending: Podiatry | Admitting: Podiatry

## 2022-07-05 DIAGNOSIS — M722 Plantar fascial fibromatosis: Secondary | ICD-10-CM

## 2022-07-05 DIAGNOSIS — G5791 Unspecified mononeuropathy of right lower limb: Secondary | ICD-10-CM

## 2022-07-05 DIAGNOSIS — M19071 Primary osteoarthritis, right ankle and foot: Secondary | ICD-10-CM | POA: Diagnosis not present

## 2022-07-05 DIAGNOSIS — D2121 Benign neoplasm of connective and other soft tissue of right lower limb, including hip: Secondary | ICD-10-CM

## 2022-07-14 ENCOUNTER — Ambulatory Visit: Payer: BC Managed Care – PPO | Admitting: Podiatry

## 2022-07-17 ENCOUNTER — Ambulatory Visit: Payer: BC Managed Care – PPO | Attending: Family Medicine | Admitting: Physical Therapy

## 2022-07-17 ENCOUNTER — Ambulatory Visit: Payer: BC Managed Care – PPO | Admitting: Podiatry

## 2022-07-17 ENCOUNTER — Other Ambulatory Visit: Payer: Self-pay

## 2022-07-17 ENCOUNTER — Telehealth: Payer: Self-pay | Admitting: Podiatry

## 2022-07-17 DIAGNOSIS — M25551 Pain in right hip: Secondary | ICD-10-CM | POA: Diagnosis not present

## 2022-07-17 DIAGNOSIS — M722 Plantar fascial fibromatosis: Secondary | ICD-10-CM | POA: Diagnosis not present

## 2022-07-17 DIAGNOSIS — M6281 Muscle weakness (generalized): Secondary | ICD-10-CM | POA: Insufficient documentation

## 2022-07-17 DIAGNOSIS — G5791 Unspecified mononeuropathy of right lower limb: Secondary | ICD-10-CM | POA: Diagnosis not present

## 2022-07-17 DIAGNOSIS — D2121 Benign neoplasm of connective and other soft tissue of right lower limb, including hip: Secondary | ICD-10-CM

## 2022-07-17 DIAGNOSIS — M5459 Other low back pain: Secondary | ICD-10-CM

## 2022-07-17 DIAGNOSIS — R2689 Other abnormalities of gait and mobility: Secondary | ICD-10-CM | POA: Insufficient documentation

## 2022-07-17 NOTE — Progress Notes (Signed)
   Chief Complaint  Patient presents with   Foot Pain    MRI results , patient states now her left foot is bothering her.     HPI: Patient is 47 y.o. female who presents today for follow-up of plantar lateral right foot pain and fibroma on the right foot that has been present for years.  Here to review MRI results. States she has been using the boot with the cushion and that has not been helping. States she is still getting the burning pain in the little toes every now and then. Now also getting pain in the left foot She does have a history of back pain and firbomyalgia. Seems that pain has been moving around with each visit and when one thing hurts she compensates and other areas of her feet start to hurt.   No Known Allergies  Review of systems is negative except as noted in the HPI.  Denies nausea/ vomiting/ fevers/ chills or night sweats.   Denies difficulty breathing, denies calf pain or tenderness  Physical Exam  Patient is awake, alert, and oriented x 3.  In no acute distress.    Vascular status is intact with palpable pedal pulses DP and PT bilateral and capillary refill time less than 3 seconds bilateral.  No edema or erythema noted.   Neurological exam reveals epicritic and protective sensation grossly intact bilateral.   Dermatological exam reveals skin is supple and dry to bilateral feet.  No open lesions present.    Musculoskeletal exam: Pain on palpation plantar lateral aspect of the right heel is noted with a palpable fibromatous knot noted. Difficult to feel so suspect it has reduced since last injection.  Direct pain at this area is encountered.  MRI right foot:  IMPRESSION: 1. Thickening and increased signal of the lateral band of the plantar fascia just distal to the calcaneal insertion concerning for plantar fasciitis. 2. Mild tendinosis of the peroneus longus and peroneus brevis tendons without a tear. 3. Mild osteoarthritis of the second, fourth and  fifth tarsometatarsal joints.    Assessment:   ICD-10-CM   1. Plantar fasciitis of right foot  M72.2     2. Fibroma of right foot  D21.21     3. Neuritis of right foot  G57.91          Plan: Discussed exam findings with the patient and we also discussed x-rays.  Discussed plantar fibromas and plantar fasciitis in the setting of nerve pains and fibromyalgia.  Reviewed MRI personally and went over results with patient.  Discussed trying another injection as they have been helpful. Procedure below.  Continue in boot as needed and heel cups. Dispensed new boot as previous boot worn and not affective anymore. Broken.  Discussed options moving forward surgery vs EPAT vs PRP injection. Discussed in detail and patient wound like to proceed with PRP injection Did discuss that if we cannot solve this pain with treatment methods so far she may have to consider a job were she can sit instead of being on concrete floors all day.  Patient to return  for PRP injection.

## 2022-07-17 NOTE — Therapy (Signed)
OUTPATIENT PHYSICAL THERAPY THORACOLUMBAR EVALUATION   Patient Name: Mckenzie Park MRN: [id number] DOB:10-Oct-1975, 47 y.o., female Today's Date: 07/17/2022  END OF SESSION:  PT End of Session - 07/17/22 1126     Visit Number 1    Number of Visits 8    Date for PT Re-Evaluation 09/11/22    Authorization Type [employer]    PT Start Time 0805    PT Stop Time 0845    PT Time Calculation (min) 40 min    Activity Tolerance Patient limited by pain    Behavior During Therapy WFL for tasks assessed/performed             Past Medical History:  Diagnosis Date   Anxiety    Depression    GERD (gastroesophageal reflux disease)    Heart murmur    irregular heart beat is on medication   Hyperlipidemia    Hypertension    Kidney stone    Past Surgical History:  Procedure Laterality Date   ABDOMINAL HYSTERECTOMY     CHOLECYSTECTOMY     kidney stone removal     KNEE ARTHROSCOPY Bilateral    TUBAL LIGATION     Patient Active Problem List   Diagnosis Date Noted   Chronic migraine without aura without status migrainosus, not intractable 08/01/2015   Essential hypertension 08/01/2015   Depression 08/01/2015    PCP: Velna Hatchet, MD  REFERRING PROVIDER: Geoffery Lyons, NP  REFERRING DIAG:  [phone] (ICD-10-CM) - Lumbago with sciatica, left side  M54.41 (ICD-10-CM) - Lumbago with sciatica, right side    Rationale for Evaluation and Treatment: Rehabilitation  THERAPY DIAG:  Muscle weakness (generalized)  Other abnormalities of gait and mobility  Pain in right hip  Other low back pain  ONSET DATE: ~1 year  SUBJECTIVE:                                                                                                                                                                                           SUBJECTIVE STATEMENT: Pt reports she just had bladder sling surgery 06/24/22. She has had bilat knee arthroscopies. Pt notes throbbing low back pain with R side radiating  symptoms. Pt states it feels like her sciatic nerve. Pt states she has not had this previously. Pt reports her x-ray did indicate arthritis in low back. Pt notes general joint pain from fibromyalgia.   PERTINENT HISTORY:  Fibromyalgia  PAIN:  Are you having pain? Yes: NPRS scale: "past a 10"/10 Pain location: low back Pain description: Radiates to R hip and down back of leg, sharp Aggravating factors: Standing, walking, certain seated positions Relieving factors: lidocaine  patch helps a little bit  PRECAUTIONS: None  WEIGHT BEARING RESTRICTIONS: No  FALLS:  Has patient fallen in last 6 months? No -- able to catch herself on dresser 1x  LIVING ENVIRONMENT: Lives with: lives with their spouse daughter, grand daughter, mother-in-law Lives in: House/apartment Stairs: Yes: External: 2 steps; none Has following equipment at home: None  OCCUPATION: Works for [employer] -- does a lot of lifting  PLOF: Independent  PATIENT GOALS: Improve pain for work and home activities  NEXT MD VISIT: n/a  OBJECTIVE:   DIAGNOSTIC FINDINGS:  Ankle MRI 2/10 IMPRESSION: 1. Thickening and increased signal of the lateral band of the plantar fascia just distal to the calcaneal insertion concerning for plantar fasciitis. 2. Mild tendinosis of the peroneus longus and peroneus brevis tendons without a tear. 3. Mild osteoarthritis of the second, fourth and fifth tarsometatarsal joints.  PATIENT SURVEYS:  FOTO 38; predicted 45  SCREENING FOR RED FLAGS: Bowel or bladder incontinence: No Spinal tumors: No Cauda equina syndrome: No Compression fracture: No Abdominal aneurysm: No  COGNITION: Overall cognitive status: Within functional limits for tasks assessed     SENSATION: WFL  MUSCLE LENGTH: Hamstrings: Right 80 deg; Left 90 deg Thomas test: Did not assess  POSTURE: anterior pelvic tilt  PALPATION: TTP R piriformis and glute; unable to tolerate PA mobs on L5-S1 and SIJ  LUMBAR ROM:    AROM eval  Flexion 80%*  Extension 100%*  Right lateral flexion 100%  Left lateral flexion 80%  Right rotation 100%  Left rotation 100%   (Blank rows = not tested) * = pain  LOWER EXTREMITY ROM:   WFL during screening  Active  Right eval Left eval  Hip flexion    Hip extension    Hip abduction    Hip adduction    Hip internal rotation    Hip external rotation    Knee flexion    Knee extension    Ankle dorsiflexion    Ankle plantarflexion    Ankle inversion    Ankle eversion     (Blank rows = not tested)  LOWER EXTREMITY MMT:    MMT Right eval Left eval  Hip flexion 4+ 4+  Hip extension 3+ 5  Hip abduction    Hip adduction 3+ 4  Hip internal rotation    Hip external rotation    Knee flexion 5 5  Knee extension 5 5  Ankle dorsiflexion    Ankle plantarflexion    Ankle inversion    Ankle eversion     (Blank rows = not tested)  LUMBAR SPECIAL TESTS:  Straight leg raise test: Negative, Slump test: Negative, and FABER test: Positive  FUNCTIONAL TESTS:  5 times sit to stand: Unable without use of UEs ; slow and maintains in forward trunk flexion  GAIT: Distance walked: 100' Assistive device utilized: None Level of assistance: Complete Independence Comments: Mildly antalgic with decreased weightshift on R, knee remains mostly extended  TODAY'S TREATMENT:  DATE: 07/17/22 See HEP    PATIENT EDUCATION:  Education details: Exam findings, POC, initial HEP, using heat and self massage Person educated: Patient Education method: Explanation, Demonstration, and Handouts Education comprehension: verbalized understanding, returned demonstration, and needs further education  HOME EXERCISE PROGRAM: Access Code: [facility] URL: [email or web address] Date: 07/17/2022 Prepared by: Estill Bamberg April Thurnell Garbe  Exercises - Seated  Figure 4 Piriformis Stretch  - 1 x daily - 7 x weekly - 2 sets - 30 sec hold - Seated Piriformis Stretch  - 1 x daily - 7 x weekly - 2 sets - 30 sec hold - Standing Glute Med Mobilization with Small Ball on Wall  - 1 x daily - 7 x weekly - 2 sets - 30 sec hold - Hip Abduction with Resistance Loop  - 1 x daily - 7 x weekly - 2 sets - 10 reps - Hip Extension with Resistance Loop  - 1 x daily - 7 x weekly - 2 sets - 10 reps  ASSESSMENT:  CLINICAL IMPRESSION: Patient is a 47 y.o. F who was seen today for physical therapy evaluation and treatment for low back pain radiating to R LE. Assessment significant for R>L hip tightness, piriformis trigger points/shortening, R>L hip weakness, and core weakness likely leading to increased impingement on R sciatic nerve. Pt would benefit from PT to address these issues for improved function.   OBJECTIVE IMPAIRMENTS: Abnormal gait, decreased mobility, difficulty walking, decreased ROM, decreased strength, increased fascial restrictions, increased muscle spasms, improper body mechanics, postural dysfunction, and pain.   ACTIVITY LIMITATIONS: lifting, bending, sitting, standing, squatting, stairs, transfers, bed mobility, hygiene/grooming, and caring for others  PARTICIPATION LIMITATIONS: meal prep, cleaning, laundry, driving, shopping, community activity, and occupation  PERSONAL FACTORS: Age, Fitness, Past/current experiences, and Time since onset of injury/illness/exacerbation are also affecting patient's functional outcome.   REHAB POTENTIAL: Good  CLINICAL DECISION MAKING: Stable/uncomplicated  EVALUATION COMPLEXITY: Low   GOALS: Goals reviewed with patient? Yes  SHORT TERM GOALS: Target date: 08/14/2022   Pt will be ind with initial HEP Baseline: Goal status: INITIAL  2.  Pt will report no pain with FABER to demo improving hip mobility Baseline:  Goal status: INITIAL    LONG TERM GOALS: Target date: 09/11/2022   Pt will be ind with  maintenance and progression of HEP Baseline:  Goal status: INITIAL  2.  Pt will be able to perform 5x STS without UE support to demo improved functional LE strength Baseline:  Goal status: INITIAL  3.  Pt will report decrease in pain by >/=50% with all activities Baseline:  Goal status: INITIAL  4.  Pt will have increased FOTO score to 53 Baseline:  Goal status: INITIAL   PLAN:  PT FREQUENCY: 1x/week  PT DURATION: 8 weeks  PLANNED INTERVENTIONS: Therapeutic exercises, Therapeutic activity, Neuromuscular re-education, Balance training, Gait training, Patient/Family education, Self Care, Joint mobilization, Stair training, Aquatic Therapy, Dry Needling, Electrical stimulation, Spinal mobilization, Cryotherapy, Moist heat, Taping, Ionotophoresis 55m/ml Dexamethasone, Manual therapy, and Re-evaluation.  PLAN FOR NEXT SESSION: Assess response to HEP. Work on piriformis/glute tightness. Strengthen hip abductors/extensors. Core strengthening.    Tezra Mahr April Ma L Won Kreuzer, PT 07/17/2022, 11:32 AM

## 2022-07-17 NOTE — Telephone Encounter (Signed)
Pt called and is needing to get scheduled per Dr Blenda Mounts in [place] for prp. Can you get me a few days and I can call pt to get scheduled.

## 2022-07-19 ENCOUNTER — Other Ambulatory Visit: Payer: Self-pay | Admitting: Podiatry

## 2022-07-21 NOTE — Telephone Encounter (Signed)
Please advise 

## 2022-07-22 ENCOUNTER — Encounter: Payer: Self-pay | Admitting: Pulmonary Disease

## 2022-07-22 ENCOUNTER — Ambulatory Visit: Payer: BC Managed Care – PPO | Admitting: Pulmonary Disease

## 2022-07-22 VITALS — BP 118/70 | HR 62 | Ht 66.0 in | Wt 261.0 lb

## 2022-07-22 DIAGNOSIS — R0789 Other chest pain: Secondary | ICD-10-CM

## 2022-07-22 DIAGNOSIS — J4520 Mild intermittent asthma, uncomplicated: Secondary | ICD-10-CM | POA: Diagnosis not present

## 2022-07-22 DIAGNOSIS — R519 Headache, unspecified: Secondary | ICD-10-CM

## 2022-07-22 DIAGNOSIS — R0683 Snoring: Secondary | ICD-10-CM

## 2022-07-22 MED ORDER — FLUTICASONE-SALMETEROL 115-21 MCG/ACT IN AERO: 2.0000 | INHALATION_SPRAY | Freq: Two times a day (BID) | RESPIRATORY_TRACT | 12 refills | Status: DC

## 2022-07-22 NOTE — Telephone Encounter (Signed)
Pt scheduled for 4.8.2024 for the prp in [place] office.

## 2022-07-22 NOTE — Patient Instructions (Addendum)
Start advair [facility] Inhaler 2 puffs twice daily - rinse mouth out after each use  Continue to use albuterol inhaler 1-2 puffs every 4-6 hours as needed  We will talk with Dr. Einar Gip about scheduling you for echo to evaluate for pulmonary hypertension.  We will schedule you for home sleep study as soon as possible  Recommend sleeping on your side and recommend getting a wedge pillow to use to sleep with your head elevated  Follow up in 2 months with pulmonary function tests

## 2022-07-22 NOTE — Progress Notes (Signed)
Synopsis: Referred in February 2024 for asthma by Velna Hatchet, MD  Subjective:   PATIENT ID: Mckenzie Park GENDER: female DOB: [birth year], MRN: [id number]  HPI  Chief Complaint  Patient presents with   Consult    Referred by PCP for possible asthma. States she gets winded easily with exertion at work and at home. Has been using albuterol as needed but has not noticed any relief.    Mckenzie Park is a 47 year old woman, never smoker with history of hypertension and GERD who is referred to pulmonary clinic for asthma.   She has progressive shortness of breath over recent years. She will have shortness of breath more so with exertion but at rest she will experience chest pressure and dyspnea. She does have cough and intermittent wheezing. She is using albuterol inhaler as needed with relief. She does report significant snoring and night time awakenings due to gasping and feeling panicked. She reports her snoring has worsened over the past 2 years. She has headaches. She denies orthopnea or lower extremity edema.  Absolute eosinophils 05/28/21 221.   She works as a Insurance underwriter at [employer]. She does not have a regular sleep schedule. She is a never smoker. She lives with her husband.   Past Medical History:  Diagnosis Date   Anxiety    Depression    GERD (gastroesophageal reflux disease)    Heart murmur    irregular heart beat is on medication   Hyperlipidemia    Hypertension    Kidney stone      Family History  Problem Relation Age of Onset   Hypertension Mother    Heart disease Mother    COPD Mother    Parkinsonism Father    Hypertension Brother    AAA (abdominal aortic aneurysm) Brother 37   Esophageal cancer Paternal Uncle    Asthma Son    Colon cancer Neg Hx    Colon polyps Neg Hx    Rectal cancer Neg Hx    Stomach cancer Neg Hx      Social History   Socioeconomic History   Marital status: Married    Spouse name: Not on file   Number of children: 4    Years of education: Not on file   Highest education level: Not on file  Occupational History   Not on file  Tobacco Use   Smoking status: Never   Smokeless tobacco: Never  Vaping Use   Vaping Use: Never used  Substance and Sexual Activity   Alcohol use: Yes    Comment: occasionally   Drug use: No   Sexual activity: Not on file  Other Topics Concern   Not on file  Social History Narrative   Not on file   Social Determinants of Health   Financial Resource Strain: Not on file  Food Insecurity: Not on file  Transportation Needs: Not on file  Physical Activity: Not on file  Stress: Not on file  Social Connections: Not on file  Intimate Partner Violence: Not on file     No Known Allergies   Outpatient Medications Prior to Visit  Medication Sig Dispense Refill   albuterol (VENTOLIN HFA) 108 (90 Base) MCG/ACT inhaler Inhale 2 puffs into the lungs every 6 (six) hours as needed for wheezing or shortness of breath.     ALPRAZolam (XANAX) 0.25 MG tablet Take 1 tablet by mouth in the morning, at noon, and at bedtime. As needed for [place]  Cholecalciferol (D-3-5) 125 MCG (5000 UT) capsule Take 5,000 Units by mouth daily.     ezetimibe (ZETIA) 10 MG tablet Take 1 tablet (10 mg total) by mouth every evening. 30 tablet 2   losartan (COZAAR) 100 MG tablet Take 1 tablet (100 mg total) by mouth every evening. 90 tablet 3   meloxicam (MOBIC) 15 MG tablet Take 1 tablet by mouth once daily 30 tablet 0   mirabegron ER (MYRBETRIQ) 50 MG TB24 tablet Take 50 mg by mouth daily.     nitroGLYCERIN (NITROSTAT) 0.4 MG SL tablet Place 1 tablet (0.4 mg total) under the tongue every 5 (five) minutes as needed for up to 25 days for chest pain. 25 tablet 3   omeprazole (PRILOSEC) 40 MG capsule TAKE 1 CAPSULE BY MOUTH ONCE DAILY BEFORE BREAKFAST 30 capsule 0   propranolol (INDERAL) 20 MG tablet Take 1 tablet (20 mg total) by mouth 3 (three) times daily. Take extra tablet if SBP > 150 or or anxiety  280 tablet 1   rosuvastatin (CRESTOR) 10 MG tablet Take 0.5 tablets (5 mg total) by mouth at bedtime. 90 tablet 0   topiramate (TOPAMAX) 25 MG capsule Take 25 mg by mouth daily.     escitalopram (LEXAPRO) 10 MG tablet Take 1 tablet by mouth daily.     gabapentin (NEURONTIN) 300 MG capsule Take 300 mg by mouth at bedtime.     guaiFENesin-Codeine 200-10 MG/5ML LIQD Take 10 mLs by mouth in the morning, at noon, and at bedtime. 210 mL 0   No facility-administered medications prior to visit.    Review of Systems  Constitutional:  Negative for chills, fever, malaise/fatigue and weight loss.  HENT:  Positive for congestion. Negative for sinus pain and sore throat.   Eyes: Negative.   Respiratory:  Positive for cough, sputum production, shortness of breath and wheezing. Negative for hemoptysis.   Cardiovascular:  Negative for chest pain, palpitations, orthopnea, claudication and leg swelling.  Gastrointestinal:  Negative for abdominal pain, heartburn, nausea and vomiting.  Genitourinary: Negative.   Musculoskeletal:  Positive for joint pain. Negative for myalgias.  Skin:  Negative for rash.  Neurological:  Positive for headaches. Negative for weakness.  Endo/Heme/Allergies:  Positive for environmental allergies.  Psychiatric/Behavioral:  Positive for depression. The patient is nervous/anxious.    Objective:   Vitals:   07/22/22 1029  BP: 118/70  Pulse: 62  SpO2: 97%  Weight: 261 lb (118.4 kg)  Height: '5\' 6"'$  (1.676 m)     Physical Exam Constitutional:      General: She is not in acute distress.    Appearance: She is obese. She is not ill-appearing.  HENT:     Head: Normocephalic and atraumatic.  Eyes:     General: No scleral icterus.    Conjunctiva/sclera: Conjunctivae normal.     Pupils: Pupils are equal, round, and reactive to light.  Cardiovascular:     Rate and Rhythm: Normal rate and regular rhythm.     Pulses: Normal pulses.     Heart sounds: Normal heart sounds. No  murmur heard. Pulmonary:     Effort: Pulmonary effort is normal.     Breath sounds: Normal breath sounds. No wheezing, rhonchi or rales.  Abdominal:     General: Bowel sounds are normal.     Palpations: Abdomen is soft.  Musculoskeletal:     Right lower leg: No edema.     Left lower leg: No edema.  Lymphadenopathy:     Cervical: No cervical  adenopathy.  Skin:    General: Skin is warm and dry.  Neurological:     General: No focal deficit present.     Mental Status: She is alert.  Psychiatric:        Mood and Affect: Mood normal.        Behavior: Behavior normal.        Thought Content: Thought content normal.        Judgment: Judgment normal.    CBC    Component Value Date/Time   WBC 9.2 05/28/2021 0909   RBC 4.79 05/28/2021 0909   HGB 13.5 05/28/2021 0909   HCT 42.5 05/28/2021 0909   PLT 334 05/28/2021 0909   MCV 88.7 05/28/2021 0909   MCH 28.2 05/28/2021 0909   MCHC 31.8 (L) 05/28/2021 0909   RDW 13.9 05/28/2021 0909   LYMPHSABS 2,052 05/28/2021 0909   MONOABS 0.7 09/08/2016 0913   EOSABS 221 05/28/2021 0909   BASOSABS 83 05/28/2021 0909      Latest Ref Rng & Units 05/28/2021    9:09 AM 04/10/2021    9:30 AM 11/09/2019   11:34 AM  BMP  Glucose 65 - 99 mg/dL 92  90  89   BUN 7 - 25 mg/dL '15  9  9   '$ Creatinine 0.50 - 0.99 mg/dL 0.56  0.59  0.60   BUN/Creat Ratio 6 - 22 (calc) NOT APPLICABLE  15  15   Sodium 135 - 146 mmol/L 137  141  140   Potassium 3.5 - 5.3 mmol/L 4.5  4.5  4.9   Chloride 98 - 110 mmol/L 102  103  103   CO2 20 - 32 mmol/L '29  25  26   '$ Calcium 8.6 - 10.2 mg/dL 9.8  9.3  9.0    Chest imaging:  PFT:     No data to display          Labs:  Path:  Echo 2020: Normal LV systolic function with EF 55%. Left ventricle cavity is normal  in size. Normal global wall motion. Normal diastolic filling pattern.  Calculated EF 55%.  Left atrial cavity is mildly dilated at 4.3 cm.  Structurally normal tricuspid valve. Mild tricuspid  regurgitation. Mild  pulmonary hypertension. Estimated pulmonary artery systolic pressure is 34  mm Hg with RA pressure estimated at 3 mm Hg. RVSP measures 34 mmHg.     Assessment & Plan:   Mild intermittent asthma without complication - Plan: fluticasone-salmeterol (ADVAIR HFA) 115-21 MCG/ACT inhaler, Pulmonary Function Test  Snoring - Plan: Home sleep test  Chest pressure - Plan: Home sleep test  Frequent headaches - Plan: Home sleep test  Discussion: Mckenzie Park is a 47 year old woman, never smoker with history of hypertension and GERD who is referred to pulmonary clinic for asthma.   She has progressive dyspnea that could be related to mild intermittent asthma vs pulmonary hypertension in setting of undiagnosed sleep apnea vs deconditioning/obesity.  She is to start advair HFA 115-48mg 2 puffs twice daily and monitor for benefit. She can continue to use as needed albuterol.   I will reach out to her cardiology team about updating an echo to further evaluate for pulmonary hypertension.   We will schedule her for a home sleep study. Recommend sleeping on her side and getting a wedge pillow to try to reduce burden of suspected sleep apnea in the mean time.   Follow up in 2 months with pulmonary function tests.  JFreda Jackson MD [place]  [facility] Office: [phone]   Current Outpatient Medications:    albuterol (VENTOLIN HFA) 108 (90 Base) MCG/ACT inhaler, Inhale 2 puffs into the lungs every 6 (six) hours as needed for wheezing or shortness of breath., Disp: , Rfl:    ALPRAZolam (XANAX) 0.25 MG tablet, Take 1 tablet by mouth in the morning, at noon, and at bedtime. As needed for Panic /Anxiety, Disp: , Rfl:    Cholecalciferol (D-3-5) 125 MCG (5000 UT) capsule, Take 5,000 Units by mouth daily., Disp: , Rfl:    ezetimibe (ZETIA) 10 MG tablet, Take 1 tablet (10 mg total) by mouth every evening., Disp: 30 tablet, Rfl: 2   fluticasone-salmeterol (ADVAIR HFA)  115-21 MCG/ACT inhaler, Inhale 2 puffs into the lungs 2 (two) times daily., Disp: 1 each, Rfl: 12   losartan (COZAAR) 100 MG tablet, Take 1 tablet (100 mg total) by mouth every evening., Disp: 90 tablet, Rfl: 3   meloxicam (MOBIC) 15 MG tablet, Take 1 tablet by mouth once daily, Disp: 30 tablet, Rfl: 0   mirabegron ER (MYRBETRIQ) 50 MG TB24 tablet, Take 50 mg by mouth daily., Disp: , Rfl:    nitroGLYCERIN (NITROSTAT) 0.4 MG SL tablet, Place 1 tablet (0.4 mg total) under the tongue every 5 (five) minutes as needed for up to 25 days for chest pain., Disp: 25 tablet, Rfl: 3   omeprazole (PRILOSEC) 40 MG capsule, TAKE 1 CAPSULE BY MOUTH ONCE DAILY BEFORE BREAKFAST, Disp: 30 capsule, Rfl: 0   propranolol (INDERAL) 20 MG tablet, Take 1 tablet (20 mg total) by mouth 3 (three) times daily. Take extra tablet if SBP > 150 or or anxiety, Disp: 280 tablet, Rfl: 1   rosuvastatin (CRESTOR) 10 MG tablet, Take 0.5 tablets (5 mg total) by mouth at bedtime., Disp: 90 tablet, Rfl: 0   topiramate (TOPAMAX) 25 MG capsule, Take 25 mg by mouth daily., Disp: , Rfl:

## 2022-07-22 NOTE — Telephone Encounter (Signed)
Communicated with Dawn and patient was scheduled at a time that worked best for the patient and provider.

## 2022-07-24 ENCOUNTER — Telehealth: Payer: Self-pay | Admitting: Cardiology

## 2022-07-24 DIAGNOSIS — N3281 Overactive bladder: Secondary | ICD-10-CM | POA: Diagnosis not present

## 2022-07-24 DIAGNOSIS — N3946 Mixed incontinence: Secondary | ICD-10-CM | POA: Diagnosis not present

## 2022-07-24 DIAGNOSIS — R0609 Other forms of dyspnea: Secondary | ICD-10-CM

## 2022-07-24 NOTE — Telephone Encounter (Signed)
ICD-10-CM   1. Dyspnea on exertion  R06.09 PCV ECHOCARDIOGRAM COMPLETE     Orders Placed This Encounter  Procedures   PCV ECHOCARDIOGRAM COMPLETE    Standing Status:   Future    Standing Expiration Date:   07/24/2023    Scheduling Instructions:     Send report to Dr. Freda Jackson (pulmonary)    No orders of the defined types were placed in this encounter.

## 2022-07-25 ENCOUNTER — Encounter: Payer: BC Managed Care – PPO | Admitting: Rehabilitative and Restorative Service Providers"

## 2022-07-31 ENCOUNTER — Encounter: Payer: BC Managed Care – PPO | Admitting: Physical Therapy

## 2022-08-07 ENCOUNTER — Encounter: Payer: BC Managed Care – PPO | Admitting: Physical Therapy

## 2022-08-14 ENCOUNTER — Encounter: Payer: BC Managed Care – PPO | Admitting: Physical Therapy

## 2022-08-15 ENCOUNTER — Ambulatory Visit: Payer: BC Managed Care – PPO

## 2022-08-15 DIAGNOSIS — G4733 Obstructive sleep apnea (adult) (pediatric): Secondary | ICD-10-CM

## 2022-08-15 DIAGNOSIS — R0789 Other chest pain: Secondary | ICD-10-CM

## 2022-08-15 DIAGNOSIS — R519 Headache, unspecified: Secondary | ICD-10-CM

## 2022-08-15 DIAGNOSIS — R0683 Snoring: Secondary | ICD-10-CM

## 2022-08-16 ENCOUNTER — Other Ambulatory Visit: Payer: Self-pay | Admitting: Podiatry

## 2022-08-25 ENCOUNTER — Other Ambulatory Visit: Payer: Self-pay

## 2022-08-25 ENCOUNTER — Ambulatory Visit: Payer: BC Managed Care – PPO

## 2022-08-25 DIAGNOSIS — R0609 Other forms of dyspnea: Secondary | ICD-10-CM | POA: Diagnosis not present

## 2022-08-25 DIAGNOSIS — R002 Palpitations: Secondary | ICD-10-CM

## 2022-08-25 DIAGNOSIS — E7800 Pure hypercholesterolemia, unspecified: Secondary | ICD-10-CM

## 2022-08-25 DIAGNOSIS — I10 Essential (primary) hypertension: Secondary | ICD-10-CM

## 2022-08-25 DIAGNOSIS — G4733 Obstructive sleep apnea (adult) (pediatric): Secondary | ICD-10-CM | POA: Diagnosis not present

## 2022-08-25 DIAGNOSIS — F411 Generalized anxiety disorder: Secondary | ICD-10-CM

## 2022-08-25 MED ORDER — EZETIMIBE 10 MG PO TABS: 10.0000 mg | ORAL_TABLET | Freq: Every evening | ORAL | 2 refills | Status: DC

## 2022-08-25 MED ORDER — PROPRANOLOL HCL 20 MG PO TABS: 20.0000 mg | ORAL_TABLET | Freq: Three times a day (TID) | ORAL | 1 refills | Status: DC

## 2022-08-25 MED ORDER — LOSARTAN POTASSIUM 100 MG PO TABS: 100.0000 mg | ORAL_TABLET | Freq: Every evening | ORAL | 3 refills | Status: DC

## 2022-08-25 MED ORDER — ROSUVASTATIN CALCIUM 10 MG PO TABS: 5.0000 mg | ORAL_TABLET | Freq: Every day | ORAL | 0 refills | Status: DC

## 2022-08-29 ENCOUNTER — Encounter: Payer: BC Managed Care – PPO | Admitting: Rehabilitative and Restorative Service Providers"

## 2022-09-01 ENCOUNTER — Other Ambulatory Visit: Payer: BC Managed Care – PPO

## 2022-09-12 ENCOUNTER — Encounter: Payer: Self-pay | Admitting: Nurse Practitioner

## 2022-09-12 ENCOUNTER — Ambulatory Visit: Payer: BC Managed Care – PPO | Admitting: Nurse Practitioner

## 2022-09-12 VITALS — BP 114/74 | HR 78 | Temp 97.7°F | Ht 65.5 in | Wt 256.4 lb

## 2022-09-12 DIAGNOSIS — G4733 Obstructive sleep apnea (adult) (pediatric): Secondary | ICD-10-CM | POA: Diagnosis not present

## 2022-09-12 DIAGNOSIS — J4530 Mild persistent asthma, uncomplicated: Secondary | ICD-10-CM | POA: Diagnosis not present

## 2022-09-12 DIAGNOSIS — J45909 Unspecified asthma, uncomplicated: Secondary | ICD-10-CM | POA: Insufficient documentation

## 2022-09-12 NOTE — Assessment & Plan Note (Signed)
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 She has significant daytime symptoms and poor sleep quality. We reviewed risks of untreated OSA and potential treatment options. She would like to move forward with CPAP therapy. Orders placed for new start auto CPAP 5-15 cmH2O. Educated on proper care/use of device/supplies. Risks/benefits reviewed. Cautioned on safe driving practices.  Patient Instructions  Start CPAP 5-15 cmH2O every night, minimum of 4-6 hours a night.  Change equipment every 30 days or as directed by DME. Wash your tubing with warm soap and water daily, hang to dry. Wash humidifier portion weekly.  Be aware of reduced alertness and do not drive or operate heavy machinery if experiencing this or drowsiness.  Healthy weight management discussed.  Notify if persistent daytime sleepiness occurs even with consistent use of CPAP.  We discussed how untreated sleep apnea puts an individual at risk for cardiac arrhthymias, pulm HTN, DM, stroke and increases their risk for daytime accidents; although, these are less with mild sleep apnea. We also briefly reviewed treatment options including weight loss, side sleeping position, oral appliance, CPAP therapy or referral to ENT for possible surgical options  Attend PFT on 5/1 as previously scheduled   Continue Advair 2 puffs Twice daily. Brush tongue and rinse mouth afterwards  Continue Albuterol inhaler 2 puffs every 6 hours as needed for shortness of breath or wheezing. Notify if symptoms persist despite rescue inhaler/neb use.   Follow up after PFT on 5/1 with Dr. Francine Graven or Philis Nettle. If symptoms do not improve or worsen, please contact office for sooner follow up or seek emergency care.

## 2022-09-12 NOTE — Patient Instructions (Addendum)
Start CPAP 5-15 cmH2O every night, minimum of 4-6 hours a night.  Change equipment every 30 days or as directed by DME. Wash your tubing with warm soap and water daily, hang to dry. Wash humidifier portion weekly.  Be aware of reduced alertness and do not drive or operate heavy machinery if experiencing this or drowsiness.  Healthy weight management discussed.  Notify if persistent daytime sleepiness occurs even with consistent use of CPAP.  We discussed how untreated sleep apnea puts an individual at risk for cardiac arrhthymias, pulm HTN, DM, stroke and increases their risk for daytime accidents; although, these are less with mild sleep apnea. We also briefly reviewed treatment options including weight loss, side sleeping position, oral appliance, CPAP therapy or referral to ENT for possible surgical options  Attend PFT on 5/1 as previously scheduled   Continue Advair 2 puffs Twice daily. Brush tongue and rinse mouth afterwards  Continue Albuterol inhaler 2 puffs every 6 hours as needed for shortness of breath or wheezing. Notify if symptoms persist despite rescue inhaler/neb use.   Follow up after PFT on 5/1 with Dr. Francine Graven or Philis Nettle. If symptoms do not improve or worsen, please contact office for sooner follow up or seek emergency care.

## 2022-09-12 NOTE — Assessment & Plan Note (Signed)
Compensated on current regimen with improvement in symptoms after starting ICS/LABA therapy. No recent exacerbations requiring steroids or abx. No hospitalizations. Action plan in place. Attend PFTs on 5/1, as previously scheduled.

## 2022-09-12 NOTE — Progress Notes (Signed)
  ID: Mckenzie Park, female    DOB: February 06, 1976, 47 y.o.   MRN: [id number]  Chief Complaint  Patient presents with   Follow-up    Review HST    Referring provider: Alysia Penna, MD  HPI: 47 year old female, never smoker followed for asthma and mild OSA. She is a patient of Dr. Lanora Manis and last seen in office for pulmonary consult 07/22/2022. Past medical history significant for migraines, HTN, depression, obesity.  TEST/EVENTS:  08/15/2022 HST: AHI 6.2/h, SpO2 low 78%  07/22/2022: OV with Dr. Francine Graven for pulmonary consult.  Progressive shortness of breath over the recent years.  Feels like it is more so with exertion but does occasionally have some at rest.  Experiences chest pressure and dyspnea.  She does have cough and intermittent wheezing.  She is using albuterol inhaler as needed with relief.  Does report significant snoring and nighttime awakenings due to gasping and feeling panicked.  She reports that her snoring has worsened over the past 2 years.  She has headaches.  Concern for asthma.  Started on ICS/LABA therapy with Advair. Scheduled for PFTs. Reach out to cardiology about scheduling echo to evaluate for PH. HST ordered.  09/12/2022: Today - follow up Patient resents today for follow-up after home sleep study.  She was found to have mild sleep apnea with AHI 6.2/h and SpO2 low 78% with an average of 92%.  We discussed her current nighttime symptoms including restless sleep and loud snoring.  She has significant daytime fatigue symptoms.  She feels like she wakes up tired.  Denies any morning headaches, sleep parasomnia/paralysis, drowsy driving.  She would like to get started on some sort of therapy to treat her sleep apnea to see if this helps with her energy levels and sleep at night. Breathing has been better with the Advair.  She feels like this is working well for her.  She has not noticed much wheezing.  Not having as much cough.  She is scheduled for PFTs on  5/1.  No Known Allergies  Immunization History  Administered Date(s) Administered   Influenza-Unspecified 01/31/2016   Pfizer Covid-19 Vaccine Bivalent Booster 44yrs & up 03/02/2021    Past Medical History:  Diagnosis Date   Anxiety    Depression    GERD (gastroesophageal reflux disease)    Heart murmur    irregular heart beat is on medication   Hyperlipidemia    Hypertension    Kidney stone     Tobacco History: Social History   Tobacco Use  Smoking Status Never   Passive exposure: Past  Smokeless Tobacco Never   Counseling given: Not Answered   Outpatient Medications Prior to Visit  Medication Sig Dispense Refill   albuterol (VENTOLIN HFA) 108 (90 Base) MCG/ACT inhaler Inhale 2 puffs into the lungs every 6 (six) hours as needed for wheezing or shortness of breath.     ALPRAZolam (XANAX) 0.25 MG tablet Take 1 tablet by mouth in the morning, at noon, and at bedtime. As needed for Panic /Anxiety     Cholecalciferol (D-3-5) 125 MCG (5000 UT) capsule Take 5,000 Units by mouth daily.     ezetimibe (ZETIA) 10 MG tablet Take 1 tablet (10 mg total) by mouth every evening. 30 tablet 2   fluticasone-salmeterol (ADVAIR HFA) 115-21 MCG/ACT inhaler Inhale 2 puffs into the lungs 2 (two) times daily. 1 each 12   losartan (COZAAR) 100 MG tablet Take 1 tablet (100 mg total) by mouth every evening. 90 tablet 3  meloxicam (MOBIC) 15 MG tablet Take 1 tablet by mouth once daily 30 tablet 0   mirabegron ER (MYRBETRIQ) 50 MG TB24 tablet Take 50 mg by mouth daily.     omeprazole (PRILOSEC) 40 MG capsule TAKE 1 CAPSULE BY MOUTH ONCE DAILY BEFORE BREAKFAST 30 capsule 0   propranolol (INDERAL) 20 MG tablet Take 1 tablet (20 mg total) by mouth 3 (three) times daily. Take extra tablet if SBP > 150 or or anxiety 280 tablet 1   rosuvastatin (CRESTOR) 10 MG tablet Take 0.5 tablets (5 mg total) by mouth at bedtime. 90 tablet 0   topiramate (TOPAMAX) 25 MG capsule Take 25 mg by mouth daily.      nitroGLYCERIN (NITROSTAT) 0.4 MG SL tablet Place 1 tablet (0.4 mg total) under the tongue every 5 (five) minutes as needed for up to 25 days for chest pain. 25 tablet 3   No facility-administered medications prior to visit.     Review of Systems:   Constitutional: No weight loss or gain, night sweats, fevers, chills, or lassitude. +daytime fatigue  HEENT: No headaches, difficulty swallowing, tooth/dental problems, or sore throat. No sneezing, itching, ear ache, nasal congestion, or post nasal drip CV:  +PND. No chest pain, orthopnea, swelling in lower extremities, anasarca, dizziness, palpitations, syncope Resp: +shortness of breath with exertion (baseline); minimal dry cough (improved); snoring, witnessed apneas at night. No excess mucus or change in color of mucus.No hemoptysis. No wheezing.  No chest wall deformity GI:  No heartburn, indigestion, abdominal pain, nausea, vomiting, diarrhea, change in bowel habits, loss of appetite, bloody stools.  GU: No dysuria, change in color of urine, urgency or frequency.   Skin: No rash, lesions, ulcerations MSK:  No joint pain or swelling.  Neuro: No dizziness or lightheadedness.  Psych: No depression or anxiety. Mood stable. +sleep disturbance    Physical Exam:  BP 114/74 (BP Location: Left Arm, Patient Position: Sitting, Cuff Size: Large)   Pulse 78   Temp 97.7 F (36.5 C) (Oral)   Ht 5' 5.5" (1.664 m)   Wt 256 lb 6.4 oz (116.3 kg)   SpO2 96%   BMI 42.02 kg/m   GEN: Pleasant, interactive, well-kempt; obese; in no acute distress. HEENT:  Normocephalic and atraumatic. PERRLA. Sclera white. Nasal turbinates pink, moist and patent bilaterally. No rhinorrhea present. Oropharynx pink and moist, without exudate or edema. No lesions, ulcerations, or postnasal drip. Mallampati II/III NECK:  Supple w/ fair ROM. No JVD present. Normal carotid impulses w/o bruits. Thyroid symmetrical with no goiter or nodules palpated. No lymphadenopathy.   CV:  RRR, no m/r/g, no peripheral edema. Pulses intact, +2 bilaterally. No cyanosis, pallor or clubbing. PULMONARY:  Unlabored, regular breathing. Clear bilaterally A&P w/o wheezes/rales/rhonchi. No accessory muscle use.  GI: BS present and normoactive. Soft, non-tender to palpation. No organomegaly or masses detected.  MSK: No erythema, warmth or tenderness. Cap refil <2 sec all extrem. No deformities or joint swelling noted.  Neuro: A/Ox3. No focal deficits noted.   Skin: Warm, no lesions or rashe Psych: Normal affect and behavior. Judgement and thought content appropriate.     Lab Results:  CBC    Component Value Date/Time   WBC 9.2 05/28/2021 0909   RBC 4.79 05/28/2021 0909   HGB 13.5 05/28/2021 0909   HCT 42.5 05/28/2021 0909   PLT 334 05/28/2021 0909   MCV 88.7 05/28/2021 0909   MCH 28.2 05/28/2021 0909   MCHC 31.8 (L) 05/28/2021 0909   RDW 13.9 05/28/2021 0909  LYMPHSABS 2,052 05/28/2021 0909   MONOABS 0.7 09/08/2016 0913   EOSABS 221 05/28/2021 0909   BASOSABS 83 05/28/2021 0909    BMET    Component Value Date/Time   NA 137 05/28/2021 0909   NA 141 04/10/2021 0930   K 4.5 05/28/2021 0909   CL 102 05/28/2021 0909   CO2 29 05/28/2021 0909   GLUCOSE 92 05/28/2021 0909   BUN 15 05/28/2021 0909   BUN 9 04/10/2021 0930   CREATININE 0.56 05/28/2021 0909   CALCIUM 9.8 05/28/2021 0909   GFRNONAA 111 11/09/2019 1134   GFRAA 128 11/09/2019 1134    BNP No results found for: "BNP"   Imaging:  PCV ECHOCARDIOGRAM COMPLETE  Result Date: 08/25/2022 Echocardiogram 08/25/2022: Normal LV systolic function with visual EF 60-65%. Left ventricle cavity is normal in size. Normal left ventricular wall thickness. Normal global wall motion. Normal diastolic filling pattern, normal LAP. Calculated EF 65%. Structurally normal tricuspid valve with trace regurgitation. No evidence of pulmonary hypertension. RVSP measures 30 mmHg. No prior available for comparison.          No data to  display          No results found for: "NITRICOXIDE"      Assessment & Plan:   Mild obstructive sleep apnea Mild OSA with AHI 6.2/h. She has significant daytime symptoms and poor sleep quality. We reviewed risks of untreated OSA and potential treatment options. She would like to move forward with CPAP therapy. Orders placed for new start auto CPAP 5-15 cmH2O. Educated on proper care/use of device/supplies. Risks/benefits reviewed. Cautioned on safe driving practices.  Patient Instructions  Start CPAP 5-15 cmH2O every night, minimum of 4-6 hours a night.  Change equipment every 30 days or as directed by DME. Wash your tubing with warm soap and water daily, hang to dry. Wash humidifier portion weekly.  Be aware of reduced alertness and do not drive or operate heavy machinery if experiencing this or drowsiness.  Healthy weight management discussed.  Notify if persistent daytime sleepiness occurs even with consistent use of CPAP.  We discussed how untreated sleep apnea puts an individual at risk for cardiac arrhthymias, pulm HTN, DM, stroke and increases their risk for daytime accidents; although, these are less with mild sleep apnea. We also briefly reviewed treatment options including weight loss, side sleeping position, oral appliance, CPAP therapy or referral to ENT for possible surgical options  Attend PFT on 5/1 as previously scheduled   Continue Advair 2 puffs Twice daily. Brush tongue and rinse mouth afterwards  Continue Albuterol inhaler 2 puffs every 6 hours as needed for shortness of breath or wheezing. Notify if symptoms persist despite rescue inhaler/neb use.   Follow up after PFT on 5/1 with Dr. Francine Graven or Philis Nettle. If symptoms do not improve or worsen, please contact office for sooner follow up or seek emergency care.    Asthma Compensated on current regimen with improvement in symptoms after starting ICS/LABA therapy. No recent exacerbations requiring steroids or  abx. No hospitalizations. Action plan in place. Attend PFTs on 5/1, as previously scheduled.    I spent 35 minutes of dedicated to the care of this patient on the date of this encounter to include pre-visit review of records, face-to-face time with the patient discussing conditions above, post visit ordering of testing, clinical documentation with the electronic health record, making appropriate referrals as documented, and communicating necessary findings to members of the patients care team.  Noemi Chapel, NP 09/12/2022  Pt aware and understands NP's role.   

## 2022-09-15 ENCOUNTER — Other Ambulatory Visit: Payer: Self-pay | Admitting: Podiatry

## 2022-09-22 ENCOUNTER — Other Ambulatory Visit: Payer: BC Managed Care – PPO

## 2022-09-24 ENCOUNTER — Ambulatory Visit (INDEPENDENT_AMBULATORY_CARE_PROVIDER_SITE_OTHER): Payer: BC Managed Care – PPO | Admitting: Pulmonary Disease

## 2022-09-24 DIAGNOSIS — J4520 Mild intermittent asthma, uncomplicated: Secondary | ICD-10-CM

## 2022-09-24 LAB — PULMONARY FUNCTION TEST
DL/VA % pred: 121 %
DL/VA: 5.22 ml/min/mmHg/L
DLCO cor % pred: 99 %
DLCO cor: 22.36 ml/min/mmHg
DLCO unc % pred: 99 %
DLCO unc: 22.36 ml/min/mmHg
FEF 25-75 Post: 2.73 L/sec
FEF 25-75 Pre: 1.82 L/sec
FEF2575-%Change-Post: 49 %
FEF2575-%Pred-Post: 91 %
FEF2575-%Pred-Pre: 61 %
FEV1-%Change-Post: 18 %
FEV1-%Pred-Post: 81 %
FEV1-%Pred-Pre: 68 %
FEV1-Post: 2.45 L
FEV1-Pre: 2.07 L
FEV1FVC-%Change-Post: 5 %
FEV1FVC-%Pred-Pre: 92 %
FEV6-%Change-Post: 11 %
FEV6-%Pred-Post: 83 %
FEV6-%Pred-Pre: 75 %
FEV6-Post: 3.1 L
FEV6-Pre: 2.77 L
FEV6FVC-%Pred-Post: 102 %
FEV6FVC-%Pred-Pre: 102 %
FVC-%Change-Post: 11 %
FVC-%Pred-Post: 81 %
FVC-%Pred-Pre: 73 %
FVC-Post: 3.1 L
FVC-Pre: 2.77 L
Post FEV1/FVC ratio: 79 %
Post FEV6/FVC ratio: 100 %
Pre FEV1/FVC ratio: 75 %
Pre FEV6/FVC Ratio: 100 %
RV % pred: 108 %
RV: 1.96 L
TLC % pred: 77 %
TLC: 4.1 L

## 2022-09-24 NOTE — Patient Instructions (Signed)
Full PFT performed today. °

## 2022-09-24 NOTE — Progress Notes (Signed)
Full PFT performed today. °

## 2022-10-02 ENCOUNTER — Ambulatory Visit: Payer: BC Managed Care – PPO | Admitting: Pulmonary Disease

## 2022-10-02 ENCOUNTER — Encounter: Payer: Self-pay | Admitting: Pulmonary Disease

## 2022-10-02 VITALS — BP 118/72 | HR 56 | Ht 65.5 in | Wt 253.0 lb

## 2022-10-02 DIAGNOSIS — J4540 Moderate persistent asthma, uncomplicated: Secondary | ICD-10-CM

## 2022-10-02 MED ORDER — BREZTRI AEROSPHERE 160-9-4.8 MCG/ACT IN AERO: 2.0000 | INHALATION_SPRAY | Freq: Two times a day (BID) | RESPIRATORY_TRACT | 0 refills | Status: DC

## 2022-10-02 NOTE — Progress Notes (Signed)
Synopsis: Referred in February 2024 for asthma by Alysia Penna, MD  Subjective:   PATIENT ID: Mckenzie Park GENDER: female DOB: May 04, 1976, MRN: [id number]  HPI  Chief Complaint  Patient presents with   Follow-up    F/U after PFT. States her breathing has been stable since last visit.    Mckenzie Park is a 47 year old woman, never smoker with history of hypertension and GERD who is referred to pulmonary clinic for asthma.   Initial OV 07/22/22 She has progressive shortness of breath over recent years. She will have shortness of breath more so with exertion but at rest she will experience chest pressure and dyspnea. She does have cough and intermittent wheezing. She is using albuterol inhaler as needed with relief. She does report significant snoring and night time awakenings due to gasping and feeling panicked. She reports her snoring has worsened over the past 2 years. She has headaches. She denies orthopnea or lower extremity edema.  Absolute eosinophils 05/28/21 221.   She works as a Set designer at [employer]. She does not have a regular sleep schedule. She is a never smoker. She lives with her husband.   OV 10/02/22 She was seen 09/12/22 by Rhunette Croft, NP for sleep apnea. CPAP was ordered.  She has some cough still. She feels like she is having throat drainage. She does not notice sinus congestion.  Reviewed PFTs that show mixed obstructive and restrictive pattern.  Past Medical History:  Diagnosis Date   Anxiety    Depression    GERD (gastroesophageal reflux disease)    Heart murmur    irregular heart beat is on medication   Hyperlipidemia    Hypertension    Kidney stone      Family History  Problem Relation Age of Onset   Hypertension Mother    Heart disease Mother    COPD Mother    Parkinsonism Father    Hypertension Brother    AAA (abdominal aortic aneurysm) Brother 61   Esophageal cancer Paternal Uncle    Asthma Son    Colon cancer Neg Hx    Colon  polyps Neg Hx    Rectal cancer Neg Hx    Stomach cancer Neg Hx      Social History   Socioeconomic History   Marital status: Married    Spouse name: Not on file   Number of children: 4   Years of education: Not on file   Highest education level: Not on file  Occupational History   Not on file  Tobacco Use   Smoking status: Never    Passive exposure: Past   Smokeless tobacco: Never  Vaping Use   Vaping Use: Never used  Substance and Sexual Activity   Alcohol use: Yes    Comment: occasionally   Drug use: No   Sexual activity: Not on file  Other Topics Concern   Not on file  Social History Narrative   Not on file   Social Determinants of Health   Financial Resource Strain: Not on file  Food Insecurity: Not on file  Transportation Needs: Not on file  Physical Activity: Not on file  Stress: Not on file  Social Connections: Not on file  Intimate Partner Violence: Not on file     No Known Allergies   Outpatient Medications Prior to Visit  Medication Sig Dispense Refill   albuterol (VENTOLIN HFA) 108 (90 Base) MCG/ACT inhaler Inhale 2 puffs into the lungs every 6 (six) hours as  needed for wheezing or shortness of breath.     ALPRAZolam (XANAX) 0.25 MG tablet Take 1 tablet by mouth in the morning, at noon, and at bedtime. As needed for Panic /Anxiety     Cholecalciferol (D-3-5) 125 MCG (5000 UT) capsule Take 5,000 Units by mouth daily.     ezetimibe (ZETIA) 10 MG tablet Take 1 tablet (10 mg total) by mouth every evening. 30 tablet 2   fluticasone-salmeterol (ADVAIR HFA) 115-21 MCG/ACT inhaler Inhale 2 puffs into the lungs 2 (two) times daily. 1 each 12   losartan (COZAAR) 100 MG tablet Take 1 tablet (100 mg total) by mouth every evening. 90 tablet 3   meloxicam (MOBIC) 15 MG tablet Take 1 tablet by mouth once daily 30 tablet 1   mirabegron ER (MYRBETRIQ) 50 MG TB24 tablet Take 50 mg by mouth daily.     omeprazole (PRILOSEC) 40 MG capsule TAKE 1 CAPSULE BY MOUTH ONCE DAILY  BEFORE BREAKFAST 30 capsule 0   propranolol (INDERAL) 20 MG tablet Take 1 tablet (20 mg total) by mouth 3 (three) times daily. Take extra tablet if SBP > 150 or or anxiety 280 tablet 1   rosuvastatin (CRESTOR) 10 MG tablet Take 0.5 tablets (5 mg total) by mouth at bedtime. 90 tablet 0   topiramate (TOPAMAX) 25 MG capsule Take 25 mg by mouth daily.     nitroGLYCERIN (NITROSTAT) 0.4 MG SL tablet Place 1 tablet (0.4 mg total) under the tongue every 5 (five) minutes as needed for up to 25 days for chest pain. 25 tablet 3   No facility-administered medications prior to visit.    Review of Systems  Constitutional:  Negative for chills, fever, malaise/fatigue and weight loss.  HENT:  Negative for congestion, sinus pain and sore throat.   Eyes: Negative.   Respiratory:  Positive for cough and sputum production. Negative for hemoptysis, shortness of breath and wheezing.   Cardiovascular:  Negative for chest pain, palpitations, orthopnea, claudication and leg swelling.  Gastrointestinal:  Negative for abdominal pain, heartburn, nausea and vomiting.  Genitourinary: Negative.   Musculoskeletal:  Negative for myalgias.  Skin:  Negative for rash.  Neurological:  Negative for weakness.  Endo/Heme/Allergies:  Positive for environmental allergies.   Objective:   Vitals:   10/02/22 0827  BP: 118/72  Pulse: (!) 56  SpO2: 100%  Weight: 253 lb (114.8 kg)  Height: 5' 5.5" (1.664 m)    Physical Exam Constitutional:      General: She is not in acute distress.    Appearance: She is obese. She is not ill-appearing.  HENT:     Head: Normocephalic and atraumatic.  Eyes:     General: No scleral icterus.    Conjunctiva/sclera: Conjunctivae normal.  Cardiovascular:     Rate and Rhythm: Normal rate and regular rhythm.     Pulses: Normal pulses.     Heart sounds: Normal heart sounds. No murmur heard. Pulmonary:     Effort: Pulmonary effort is normal.     Breath sounds: Normal breath sounds. No  wheezing, rhonchi or rales.  Musculoskeletal:     Right lower leg: No edema.     Left lower leg: No edema.  Skin:    General: Skin is warm and dry.  Neurological:     General: No focal deficit present.     Mental Status: She is alert.    CBC    Component Value Date/Time   WBC 9.2 05/28/2021 0909   RBC 4.79 05/28/2021 0909  HGB 13.5 05/28/2021 0909   HCT 42.5 05/28/2021 0909   PLT 334 05/28/2021 0909   MCV 88.7 05/28/2021 0909   MCH 28.2 05/28/2021 0909   MCHC 31.8 (L) 05/28/2021 0909   RDW 13.9 05/28/2021 0909   LYMPHSABS 2,052 05/28/2021 0909   MONOABS 0.7 09/08/2016 0913   EOSABS 221 05/28/2021 0909   BASOSABS 83 05/28/2021 0909      Latest Ref Rng & Units 05/28/2021    9:09 AM 04/10/2021    9:30 AM 11/09/2019   11:34 AM  BMP  Glucose 65 - 99 mg/dL 92  90  89   BUN 7 - 25 mg/dL 15  9  9    Creatinine 0.50 - 0.99 mg/dL 1.61  0.96  0.45   BUN/Creat Ratio 6 - 22 (calc) NOT APPLICABLE  15  15   Sodium 135 - 146 mmol/L 137  141  140   Potassium 3.5 - 5.3 mmol/L 4.5  4.5  4.9   Chloride 98 - 110 mmol/L 102  103  103   CO2 20 - 32 mmol/L 29  25  26    Calcium 8.6 - 10.2 mg/dL 9.8  9.3  9.0    Chest imaging:  PFT:    Latest Ref Rng & Units 09/24/2022    9:32 AM  PFT Results  FVC-Pre L 2.77   FVC-Predicted Pre % 73   FVC-Post L 3.10   FVC-Predicted Post % 81   Pre FEV1/FVC % % 75   Post FEV1/FCV % % 79   FEV1-Pre L 2.07   FEV1-Predicted Pre % 68   FEV1-Post L 2.45   DLCO uncorrected ml/min/mmHg 22.36   DLCO UNC% % 99   DLCO corrected ml/min/mmHg 22.36   DLCO COR %Predicted % 99   DLVA Predicted % 121   TLC L 4.10   TLC % Predicted % 77   RV % Predicted % 108     Labs:  Path:  Echo 2020: Normal LV systolic function with EF 55%. Left ventricle cavity is normal  in size. Normal global wall motion. Normal diastolic filling pattern.  Calculated EF 55%.  Left atrial cavity is mildly dilated at 4.3 cm.  Structurally normal tricuspid valve. Mild tricuspid  regurgitation. Mild  pulmonary hypertension. Estimated pulmonary artery systolic pressure is 34  mm Hg with RA pressure estimated at 3 mm Hg. RVSP measures 34 mmHg.     Echo 08/25/22 Echocardiogram 08/25/2022: Normal LV systolic function with visual EF 60-65%. Left ventricle cavity is normal in size. Normal left ventricular wall thickness. Normal global wall motion. Normal diastolic filling pattern, normal LAP. Calculated EF 65%. Structurally normal tricuspid valve with trace regurgitation. No evidence of pulmonary hypertension. RVSP measures 30 mmHg. No prior available for comparison.   Assessment & Plan:   Moderate persistent asthma without complication  Discussion: Mckenzie Park is a 47 year old woman, never smoker with history of hypertension and GERD who reutrns to pulmonary clinic for asthma.   She has moderate persistent asthma. She is to try breztri inhaler 2 puffs twice daily and monitor for improvement in her symptoms.   She has mixed obstructive and restrictive defects on her PFTs in setting of asthma and obesity.   She is still waiting on her CPAP machine for OSA.   Follow up in 6 months.   Melody Comas, MD [facility] Office: [phone]   Current Outpatient Medications:    albuterol (VENTOLIN HFA) 108 (90 Base) MCG/ACT inhaler, Inhale 2  puffs into the lungs every 6 (six) hours as needed for wheezing or shortness of breath., Disp: , Rfl:    ALPRAZolam (XANAX) 0.25 MG tablet, Take 1 tablet by mouth in the morning, at noon, and at bedtime. As needed for Panic /Anxiety, Disp: , Rfl:    Budeson-Glycopyrrol-Formoterol (BREZTRI AEROSPHERE) 160-9-4.8 MCG/ACT AERO, Inhale 2 puffs into the lungs in the morning and at bedtime., Disp: 2 each, Rfl: 0   Cholecalciferol (D-3-5) 125 MCG (5000 UT) capsule, Take 5,000 Units by mouth daily., Disp: , Rfl:    ezetimibe (ZETIA) 10 MG tablet, Take 1 tablet (10 mg total) by mouth every evening., Disp: 30 tablet,  Rfl: 2   fluticasone-salmeterol (ADVAIR HFA) 115-21 MCG/ACT inhaler, Inhale 2 puffs into the lungs 2 (two) times daily., Disp: 1 each, Rfl: 12   losartan (COZAAR) 100 MG tablet, Take 1 tablet (100 mg total) by mouth every evening., Disp: 90 tablet, Rfl: 3   meloxicam (MOBIC) 15 MG tablet, Take 1 tablet by mouth once daily, Disp: 30 tablet, Rfl: 1   mirabegron ER (MYRBETRIQ) 50 MG TB24 tablet, Take 50 mg by mouth daily., Disp: , Rfl:    omeprazole (PRILOSEC) 40 MG capsule, TAKE 1 CAPSULE BY MOUTH ONCE DAILY BEFORE BREAKFAST, Disp: 30 capsule, Rfl: 0   propranolol (INDERAL) 20 MG tablet, Take 1 tablet (20 mg total) by mouth 3 (three) times daily. Take extra tablet if SBP > 150 or or anxiety, Disp: 280 tablet, Rfl: 1   rosuvastatin (CRESTOR) 10 MG tablet, Take 0.5 tablets (5 mg total) by mouth at bedtime., Disp: 90 tablet, Rfl: 0   topiramate (TOPAMAX) 25 MG capsule, Take 25 mg by mouth daily., Disp: , Rfl:    nitroGLYCERIN (NITROSTAT) 0.4 MG SL tablet, Place 1 tablet (0.4 mg total) under the tongue every 5 (five) minutes as needed for up to 25 days for chest pain., Disp: 25 tablet, Rfl: 3

## 2022-10-02 NOTE — Patient Instructions (Addendum)
Try breztri inhaler 2 puffs, twice daily - rinse mouth out after each use  Hold advair inhaler while using the breztri  Continue to use albuterol inhaler 1-2 puffs every 4-6 hours as needed  Please send [place] a mychart message if you would like to continue on the breztri inhaler.  Use the allergy medicine daily for your throat drainage and consider using flonase nasal spray daily  Follow up in 6 months

## 2022-10-06 ENCOUNTER — Encounter: Payer: Self-pay | Admitting: Pulmonary Disease

## 2022-10-07 MED ORDER — BREZTRI AEROSPHERE 160-9-4.8 MCG/ACT IN AERO: 2.0000 | INHALATION_SPRAY | Freq: Two times a day (BID) | RESPIRATORY_TRACT | 11 refills | Status: DC

## 2022-10-07 MED ORDER — SPACER/AERO-HOLDING CHAMBERS DEVI: 1 refills | Status: DC

## 2022-10-28 ENCOUNTER — Ambulatory Visit: Payer: BC Managed Care – PPO | Admitting: Pulmonary Disease

## 2022-10-29 DIAGNOSIS — M5431 Sciatica, right side: Secondary | ICD-10-CM | POA: Diagnosis not present

## 2022-10-29 DIAGNOSIS — M79671 Pain in right foot: Secondary | ICD-10-CM | POA: Diagnosis not present

## 2022-10-29 DIAGNOSIS — M79672 Pain in left foot: Secondary | ICD-10-CM | POA: Diagnosis not present

## 2022-11-17 ENCOUNTER — Other Ambulatory Visit: Payer: Self-pay | Admitting: Cardiology

## 2022-11-17 DIAGNOSIS — E7800 Pure hypercholesterolemia, unspecified: Secondary | ICD-10-CM

## 2022-11-17 MED ORDER — ROSUVASTATIN CALCIUM 10 MG PO TABS: 5.0000 mg | ORAL_TABLET | Freq: Every day | ORAL | 0 refills | Status: DC

## 2022-11-20 DIAGNOSIS — F99 Mental disorder, not otherwise specified: Secondary | ICD-10-CM | POA: Diagnosis not present

## 2022-11-20 DIAGNOSIS — Z6839 Body mass index (BMI) 39.0-39.9, adult: Secondary | ICD-10-CM | POA: Diagnosis not present

## 2022-11-20 DIAGNOSIS — N951 Menopausal and female climacteric states: Secondary | ICD-10-CM | POA: Diagnosis not present

## 2022-11-20 DIAGNOSIS — R300 Dysuria: Secondary | ICD-10-CM | POA: Diagnosis not present

## 2022-11-20 DIAGNOSIS — E8941 Symptomatic postprocedural ovarian failure: Secondary | ICD-10-CM | POA: Diagnosis not present

## 2022-11-20 DIAGNOSIS — Z01411 Encounter for gynecological examination (general) (routine) with abnormal findings: Secondary | ICD-10-CM | POA: Diagnosis not present

## 2022-11-21 DIAGNOSIS — M5431 Sciatica, right side: Secondary | ICD-10-CM | POA: Diagnosis not present

## 2022-11-21 DIAGNOSIS — M79671 Pain in right foot: Secondary | ICD-10-CM | POA: Diagnosis not present

## 2022-11-29 ENCOUNTER — Other Ambulatory Visit: Payer: Self-pay | Admitting: Pulmonary Disease

## 2022-12-22 DIAGNOSIS — M5431 Sciatica, right side: Secondary | ICD-10-CM | POA: Diagnosis not present

## 2022-12-22 DIAGNOSIS — M797 Fibromyalgia: Secondary | ICD-10-CM | POA: Diagnosis not present

## 2022-12-30 ENCOUNTER — Other Ambulatory Visit: Payer: Self-pay | Admitting: Pulmonary Disease

## 2023-01-06 DIAGNOSIS — M5431 Sciatica, right side: Secondary | ICD-10-CM | POA: Diagnosis not present

## 2023-01-06 DIAGNOSIS — M5416 Radiculopathy, lumbar region: Secondary | ICD-10-CM | POA: Diagnosis not present

## 2023-02-27 ENCOUNTER — Other Ambulatory Visit: Payer: Self-pay | Admitting: Cardiology

## 2023-02-27 DIAGNOSIS — E7800 Pure hypercholesterolemia, unspecified: Secondary | ICD-10-CM

## 2023-03-06 ENCOUNTER — Other Ambulatory Visit: Payer: Self-pay | Admitting: Cardiology

## 2023-03-06 DIAGNOSIS — E7800 Pure hypercholesterolemia, unspecified: Secondary | ICD-10-CM

## 2023-04-07 DIAGNOSIS — M7061 Trochanteric bursitis, right hip: Secondary | ICD-10-CM | POA: Insufficient documentation

## 2023-04-07 DIAGNOSIS — M5416 Radiculopathy, lumbar region: Secondary | ICD-10-CM | POA: Insufficient documentation

## 2023-04-28 ENCOUNTER — Encounter: Payer: Self-pay | Admitting: Pulmonary Disease

## 2023-04-28 ENCOUNTER — Ambulatory Visit: Payer: BC Managed Care – PPO | Admitting: Pulmonary Disease

## 2023-04-28 VITALS — BP 104/80 | HR 93 | Temp 98.1°F | Ht 65.5 in | Wt 220.6 lb

## 2023-04-28 DIAGNOSIS — J4540 Moderate persistent asthma, uncomplicated: Secondary | ICD-10-CM | POA: Diagnosis not present

## 2023-04-28 DIAGNOSIS — G4733 Obstructive sleep apnea (adult) (pediatric): Secondary | ICD-10-CM

## 2023-04-28 MED ORDER — ALBUTEROL SULFATE HFA 108 (90 BASE) MCG/ACT IN AERS: 2.0000 | INHALATION_SPRAY | Freq: Four times a day (QID) | RESPIRATORY_TRACT | 11 refills | Status: DC | PRN

## 2023-04-28 NOTE — Progress Notes (Signed)
Synopsis: Referred in February 2024 for asthma by Alysia Penna, MD  Subjective:   PATIENT ID: Mckenzie Park GENDER: female DOB: Jan 20, 1976, MRN: [id number]  HPI  Chief Complaint  Patient presents with   Follow-up    Has had CPAP machine a couple of months.  The mask is causing pain between her eyes.  Markus Daft is working better than Advair.   Mckenzie Park is a 47 year old woman, never smoker with history of hypertension and GERD who is referred to pulmonary clinic for asthma.   Initial OV 07/22/22 She has progressive shortness of breath over recent years. She will have shortness of breath more so with exertion but at rest she will experience chest pressure and dyspnea. She does have cough and intermittent wheezing. She is using albuterol inhaler as needed with relief. She does report significant snoring and night time awakenings due to gasping and feeling panicked. She reports her snoring has worsened over the past 2 years. She has headaches. She denies orthopnea or lower extremity edema.  Absolute eosinophils 05/28/21 221.   She works as a Set designer at [employer]. She does not have a regular sleep schedule. She is a never smoker. She lives with her husband.   OV 10/02/22 She was seen 09/12/22 by Rhunette Croft, NP for sleep apnea. CPAP was ordered.  She has some cough still. She feels like she is having throat drainage. She does not notice sinus congestion.  Reviewed PFTs that show mixed obstructive and restrictive pattern.  Today OV 04/28/23 The patient, with a history of asthma and sleep apnea, reports that the [facility] inhaler is effective in managing their asthma symptoms. They use it twice daily, in the morning and evening, and carry an albuterol inhaler for emergency use. They have not needed to use the albuterol recently. However, they have been experiencing chest pressure and tightness, which they attribute to running around at work and exposure to Environmental consultant. They had a  severe episode of chest pain recently, which was relieved with nitroglycerin. Their blood pressure was elevated during this episode, but it calmed down after using the inhaler.  The patient also reports discomfort with their BiPAP mask, which they have been using for about four months. Compliance report shows she is wearing it for 20 out of the last 30 days, with eight of those days exceeding four hours of use. The mask causes irritation on their forehead and leaks air when they turn in their sleep. They have tried adjusting it multiple times without success. They have also noticed a strange smell from the mask despite regular cleaning.   Past Medical History:  Diagnosis Date   Anxiety    Depression    GERD (gastroesophageal reflux disease)    Heart murmur    irregular heart beat is on medication   Hyperlipidemia    Hypertension    Kidney stone      Family History  Problem Relation Age of Onset   Hypertension Mother    Heart disease Mother    COPD Mother    Parkinsonism Father    Hypertension Brother    AAA (abdominal aortic aneurysm) Brother 26   Esophageal cancer Paternal Uncle    Asthma Son    Colon cancer Neg Hx    Colon polyps Neg Hx    Rectal cancer Neg Hx    Stomach cancer Neg Hx      Social History   Socioeconomic History   Marital status: Married  Spouse name: Not on file   Number of children: 4   Years of education: Not on file   Highest education level: Not on file  Occupational History   Not on file  Tobacco Use   Smoking status: Never    Passive exposure: Past   Smokeless tobacco: Never  Vaping Use   Vaping status: Never Used  Substance and Sexual Activity   Alcohol use: Yes    Comment: occasionally   Drug use: No   Sexual activity: Not on file  Other Topics Concern   Not on file  Social History Narrative   Not on file   Social Determinants of Health   Financial Resource Strain: Low Risk  (12/20/2021)   [facility]   Overall Financial Resource Strain (CARDIA)    Difficulty of Paying Living Expenses: Not very hard  Food Insecurity: No Food Insecurity (12/20/2021)   [facility]   Hunger Vital Sign    Worried About Running Out of Food in the Last Year: Never true    Ran Out of Food in the Last Year: Never true  Transportation Needs: Not on file  Physical Activity: Inactive (12/20/2021)   [facility]   Exercise Vital Sign    Days of Exercise per Week: 0 days    Minutes of Exercise per Session: 0 min  Stress: Stress Concern Present (06/24/2022)   Received from [facility], [facility] - Occupational Stress Questionnaire    Feeling of Stress : Very much  Social Connections: Unknown (12/22/2022)   Received from [facility]   Social Network    Social Network: Not on file  Intimate Partner Violence: Unknown (12/22/2022)   Received from [facility]   HITS    Physically Hurt: Not on file    Insult or Talk Down To: Not on file    Threaten Physical Harm: Not on file    Scream or Curse: Not on file     No Known Allergies   Outpatient Medications Prior to Visit  Medication Sig Dispense Refill   ALPRAZolam (XANAX) 0.25 MG tablet Take 1 tablet by mouth in the morning, at noon, and at bedtime. As needed for Panic /Anxiety     Budeson-Glycopyrrol-Formoterol (BREZTRI AEROSPHERE) 160-9-4.8 MCG/ACT AERO Inhale 2 puffs into the lungs in the morning and at bedtime. 10.7 g 11   Cholecalciferol (D-3-5) 125 MCG (5000 UT) capsule Take 5,000 Units by mouth daily.     ezetimibe (ZETIA) 10 MG tablet TAKE 1 TABLET BY MOUTH ONCE DAILY IN THE EVENING 90 tablet 0   losartan (COZAAR) 100 MG tablet Take 1 tablet (100 mg total) by mouth every evening. 90 tablet 3   meloxicam (MOBIC) 15 MG tablet Take 1 tablet by mouth once daily 30 tablet 1   mirabegron ER  (MYRBETRIQ) 50 MG TB24 tablet Take 50 mg by mouth daily.     omeprazole (PRILOSEC) 40 MG capsule TAKE 1 CAPSULE BY MOUTH ONCE DAILY BEFORE BREAKFAST 30 capsule 0   propranolol (INDERAL) 20 MG tablet Take 1 tablet (20 mg total) by mouth 3 (three) times daily. Take extra tablet if SBP > 150 or or anxiety 280 tablet 1   rosuvastatin (CRESTOR) 10 MG tablet Take 0.5 tablets (5 mg total) by mouth at bedtime. 90 tablet 0   Spacer/Aero-Holding Chambers (EQ SPACE CHAMBER ANTI-STATIC) DEVI USE AS  DIRECTED 1 each 0   topiramate (TOPAMAX) 25 MG capsule Take 25 mg by mouth daily.     albuterol (VENTOLIN HFA) 108 (90 Base) MCG/ACT inhaler Inhale 2 puffs into the lungs every 6 (six) hours as needed for wheezing or shortness of breath.     nitroGLYCERIN (NITROSTAT) 0.4 MG SL tablet Place 1 tablet (0.4 mg total) under the tongue every 5 (five) minutes as needed for up to 25 days for chest pain. 25 tablet 3   Budeson-Glycopyrrol-Formoterol (BREZTRI AEROSPHERE) 160-9-4.8 MCG/ACT AERO Inhale 2 puffs into the lungs in the morning and at bedtime. 2 each 0   fluticasone-salmeterol (ADVAIR HFA) 115-21 MCG/ACT inhaler Inhale 2 puffs into the lungs 2 (two) times daily. 1 each 12   No facility-administered medications prior to visit.    Review of Systems  Constitutional:  Negative for chills, fever, malaise/fatigue and weight loss.  HENT:  Negative for congestion, sinus pain and sore throat.   Eyes: Negative.   Respiratory:  Negative for cough, hemoptysis, sputum production, shortness of breath and wheezing.   Cardiovascular:  Negative for chest pain, palpitations, orthopnea, claudication and leg swelling.  Gastrointestinal:  Negative for abdominal pain, heartburn, nausea and vomiting.  Genitourinary: Negative.   Musculoskeletal:  Negative for myalgias.  Skin:  Negative for rash.  Neurological:  Negative for weakness.  Endo/Heme/Allergies:  Positive for environmental allergies.   Objective:   Vitals:   04/28/23  1306  BP: 104/80  Pulse: 93  Temp: 98.1 F (36.7 C)  TempSrc: Oral  SpO2: 100%  Weight: 220 lb 9.6 oz (100.1 kg)  Height: 5' 5.5" (1.664 m)     Physical Exam Constitutional:      General: She is not in acute distress.    Appearance: She is obese. She is not ill-appearing.  HENT:     Head: Normocephalic and atraumatic.  Eyes:     General: No scleral icterus.    Conjunctiva/sclera: Conjunctivae normal.  Cardiovascular:     Rate and Rhythm: Normal rate and regular rhythm.     Pulses: Normal pulses.     Heart sounds: Normal heart sounds. No murmur heard. Pulmonary:     Effort: Pulmonary effort is normal.     Breath sounds: Normal breath sounds. No wheezing, rhonchi or rales.  Musculoskeletal:     Right lower leg: No edema.     Left lower leg: No edema.  Skin:    General: Skin is warm and dry.  Neurological:     General: No focal deficit present.     Mental Status: She is alert.    CBC    Component Value Date/Time   WBC 9.2 05/28/2021 0909   RBC 4.79 05/28/2021 0909   HGB 13.5 05/28/2021 0909   HCT 42.5 05/28/2021 0909   PLT 334 05/28/2021 0909   MCV 88.7 05/28/2021 0909   MCH 28.2 05/28/2021 0909   MCHC 31.8 (L) 05/28/2021 0909   RDW 13.9 05/28/2021 0909   LYMPHSABS 2,052 05/28/2021 0909   MONOABS 0.7 09/08/2016 0913   EOSABS 221 05/28/2021 0909   BASOSABS 83 05/28/2021 0909      Latest Ref Rng & Units 05/28/2021    9:09 AM 04/10/2021    9:30 AM 11/09/2019   11:34 AM  BMP  Glucose 65 - 99 mg/dL 92  90  89   BUN 7 - 25 mg/dL 15  9  9    Creatinine 0.50 - 0.99 mg/dL 1.61  0.96  0.45   BUN/Creat Ratio  6 - 22 (calc) NOT APPLICABLE  15  15   Sodium 135 - 146 mmol/L 137  141  140   Potassium 3.5 - 5.3 mmol/L 4.5  4.5  4.9   Chloride 98 - 110 mmol/L 102  103  103   CO2 20 - 32 mmol/L 29  25  26    Calcium 8.6 - 10.2 mg/dL 9.8  9.3  9.0    Chest imaging:  PFT:    Latest Ref Rng & Units 09/24/2022    9:32 AM  PFT Results  FVC-Pre L 2.77   FVC-Predicted Pre %  73   FVC-Post L 3.10   FVC-Predicted Post % 81   Pre FEV1/FVC % % 75   Post FEV1/FCV % % 79   FEV1-Pre L 2.07   FEV1-Predicted Pre % 68   FEV1-Post L 2.45   DLCO uncorrected ml/min/mmHg 22.36   DLCO UNC% % 99   DLCO corrected ml/min/mmHg 22.36   DLCO COR %Predicted % 99   DLVA Predicted % 121   TLC L 4.10   TLC % Predicted % 77   RV % Predicted % 108     Labs:  Path:  Echo 2020: Normal LV systolic function with EF 55%. Left ventricle cavity is normal  in size. Normal global wall motion. Normal diastolic filling pattern.  Calculated EF 55%.  Left atrial cavity is mildly dilated at 4.3 cm.  Structurally normal tricuspid valve. Mild tricuspid regurgitation. Mild  pulmonary hypertension. Estimated pulmonary artery systolic pressure is 34  mm Hg with RA pressure estimated at 3 mm Hg. RVSP measures 34 mmHg.     Echo 08/25/22 Echocardiogram 08/25/2022: Normal LV systolic function with visual EF 60-65%. Left ventricle cavity is normal in size. Normal left ventricular wall thickness. Normal global wall motion. Normal diastolic filling pattern, normal LAP. Calculated EF 65%. Structurally normal tricuspid valve with trace regurgitation. No evidence of pulmonary hypertension. RVSP measures 30 mmHg. No prior available for comparison.   Assessment & Plan:   Moderate persistent asthma without complication - Plan: albuterol (VENTOLIN HFA) 108 (90 Base) MCG/ACT inhaler  Discussion: Mckenzie Park is a 47 year old woman, never smoker with history of hypertension and GERD who reutrns to pulmonary clinic for asthma and sleep apnea.  Obstructive Sleep Apnea Patient reports discomfort with current CPAP mask leading to inconsistent use. AHI is 1.5 when used, indicating effective treatment when worn. -Order Respironics DreamWear full face mask from [place] for patient to try. -Advise patient to try using a foam bandage for padding in the meantime to alleviate discomfort. -She is to message our  clinic once she has her new mask and we will arrange follow up at that time.  Asthma Patient reports relief with Breztri and carries albuterol for emergencies. Reports chest tightness and pressure at work, potentially due to high blood pressure or asthma symptoms. -Continue Breztri 2 puffs BID. -Refill albuterol prescription for emergency use. -Advise patient to monitor blood pressure and report if consistently high.  Melody Comas, MD [facility] Office: [phone]   Current Outpatient Medications:    ALPRAZolam (XANAX) 0.25 MG tablet, Take 1 tablet by mouth in the morning, at noon, and at bedtime. As needed for Panic /Anxiety, Disp: , Rfl:    Budeson-Glycopyrrol-Formoterol (BREZTRI AEROSPHERE) 160-9-4.8 MCG/ACT AERO, Inhale 2 puffs into the lungs in the morning and at bedtime., Disp: 10.7 g, Rfl: 11   Cholecalciferol (D-3-5) 125 MCG (5000 UT) capsule, Take 5,000 Units by mouth daily.,  Disp: , Rfl:    ezetimibe (ZETIA) 10 MG tablet, TAKE 1 TABLET BY MOUTH ONCE DAILY IN THE EVENING, Disp: 90 tablet, Rfl: 0   losartan (COZAAR) 100 MG tablet, Take 1 tablet (100 mg total) by mouth every evening., Disp: 90 tablet, Rfl: 3   meloxicam (MOBIC) 15 MG tablet, Take 1 tablet by mouth once daily, Disp: 30 tablet, Rfl: 1   mirabegron ER (MYRBETRIQ) 50 MG TB24 tablet, Take 50 mg by mouth daily., Disp: , Rfl:    omeprazole (PRILOSEC) 40 MG capsule, TAKE 1 CAPSULE BY MOUTH ONCE DAILY BEFORE BREAKFAST, Disp: 30 capsule, Rfl: 0   propranolol (INDERAL) 20 MG tablet, Take 1 tablet (20 mg total) by mouth 3 (three) times daily. Take extra tablet if SBP > 150 or or anxiety, Disp: 280 tablet, Rfl: 1   rosuvastatin (CRESTOR) 10 MG tablet, Take 0.5 tablets (5 mg total) by mouth at bedtime., Disp: 90 tablet, Rfl: 0   Spacer/Aero-Holding Chambers (EQ SPACE CHAMBER ANTI-STATIC) DEVI, USE AS DIRECTED, Disp: 1 each, Rfl: 0   topiramate (TOPAMAX) 25 MG capsule, Take 25 mg by mouth daily., Disp: ,  Rfl:    albuterol (VENTOLIN HFA) 108 (90 Base) MCG/ACT inhaler, Inhale 2 puffs into the lungs every 6 (six) hours as needed for wheezing or shortness of breath., Disp: 8 g, Rfl: 11   nitroGLYCERIN (NITROSTAT) 0.4 MG SL tablet, Place 1 tablet (0.4 mg total) under the tongue every 5 (five) minutes as needed for up to 25 days for chest pain., Disp: 25 tablet, Rfl: 3

## 2023-04-28 NOTE — Addendum Note (Signed)
Addended by: Delrae Rend on: 04/28/2023 01:58 PM   Modules accepted: Orders

## 2023-04-28 NOTE — Patient Instructions (Addendum)
Continue breztri 2 puffs twice daily - rinse mouth out after each use  Continue albuterol inhaler 1-2 puffs every 4-6 hours as needed  We will send an order to [facility] for a DreamWear Full face mask fitting to try a new mask style with your CPAP.  Recommend using your CPAP for at least 4 hours every night  Please send a MyChart message to use once you have tried your new mask for a week or two and we will schedule you for a compliance visit

## 2023-04-30 ENCOUNTER — Ambulatory Visit
Admission: RE | Admit: 2023-04-30 | Discharge: 2023-04-30 | Disposition: A | Discharge status: Home / Self Care | Payer: BC Managed Care – PPO | Source: Ambulatory Visit | Attending: Internal Medicine | Admitting: Internal Medicine

## 2023-04-30 ENCOUNTER — Other Ambulatory Visit: Payer: Self-pay | Admitting: Internal Medicine

## 2023-04-30 ENCOUNTER — Other Ambulatory Visit: Payer: Self-pay | Admitting: Nurse Practitioner

## 2023-04-30 DIAGNOSIS — R109 Unspecified abdominal pain: Secondary | ICD-10-CM

## 2023-04-30 MED ORDER — IOPAMIDOL (ISOVUE-300) INJECTION 61%
500.0000 mL | Freq: Once | INTRAVENOUS | Status: AC | PRN
  Administered 2023-04-30: 100 mL via INTRAVENOUS

## 2023-06-07 NOTE — Progress Notes (Signed)
 Cardiology Office Note:  .   Date:  06/08/2023  ID:  Mckenzie Park, DOB 1975-08-12, MRN [id number] PCP: Alysia Penna, MD  [facility] Providers Cardiologist:  Yates Decamp, MD   History of Present Illness: .   Mckenzie Park is a 48 y.o. Caucasian female with hypertension, fibromyalgia, morbid obesity, family history of aortic dissection in her brother at age 57 died suddenly with aortic dissection, known to have CAD as well.  In view of her family history of premature coronary artery disease, father having had aortic valve disease and aortic valve replacement, she had normal coronary arteries and calcium score of 0 by CTA of the coronaries on 11/11/2019 with no significant aortic abnormality.   Discussed the use of AI scribe software for clinical note transcription with the patient, who gave verbal consent to proceed.  History of Present Illness   Mckenzie Park, a 48 year old with a significant family history of heart disease, presents with ongoing chest discomfort and tingling sensations in her hands, face, and feet. She has lost a significant amount of weight recently, approximately 28 pounds, by reducing her intake of salt and sugar and increasing her water consumption.  She describes episodes of shortness of breath, during which she feels as though someone is sitting on her chest. During these episodes, she experiences tingling in both hands, her mouth, and both feet. She has had three such episodes since December 30th. She also reports heart racing symptoms.  Mckenzie Park's symptoms have caused her significant concern, particularly in light of her family history of heart disease. Her mother's brother recently passed away suddenly from a heart attack, and both of her parents have a history of heart issues.  She has been taking nitroglycerin for her chest discomfort, but was advised by a nurse not to continue due to the age of the medication. She has been adhering to her prescribed medication  regimen, which is monitored by her husband.      Labs   External Labs:  Labs 04/01/2023:  Serum glucose 24 mg, BUN 15, creatinine 0.75, EGFR 99 mL, potassium 4.0.  Hb 13.1/HCT 41.7, platelets 255, normal indicis.  Total cholesterol 05/26/2021, triglycerides 63, HDL 57, LDL 53.  TSH normal at 2.280.  Review of Systems  Cardiovascular:  Positive for chest pain and palpitations. Negative for dyspnea on exertion and leg swelling.  Psychiatric/Behavioral:  The patient is nervous/anxious.     Physical Exam:   VS:  BP 122/78 (BP Location: Left Arm, Patient Position: Sitting, Cuff Size: Large)   Pulse (!) 47   Resp 16   Ht 5\' 5"  (1.651 m)   Wt 105.1 kg   SpO2 97%   BMI 38.54 kg/m    Wt Readings from Last 3 Encounters:  06/08/23 105.1 kg  04/28/23 100.1 kg  10/02/22 114.8 kg     Physical Exam Constitutional:      Appearance: She is obese.  Neck:     Vascular: No carotid bruit or JVD.  Cardiovascular:     Rate and Rhythm: Normal rate and regular rhythm.     Pulses: Intact distal pulses.     Heart sounds: Normal heart sounds. No murmur heard.    No gallop.  Pulmonary:     Effort: Pulmonary effort is normal.     Breath sounds: Normal breath sounds.  Abdominal:     General: Bowel sounds are normal.     Palpations: Abdomen is soft.  Musculoskeletal:     Right lower leg: No edema.  Left lower leg: No edema.     Studies Reviewed: Marland Kitchen    Coronary CT morphology 11/11/2019: Coronary calcium score: The patient's coronary artery calcium score is 0, which places the patient in the 0 percentile. Coronary arteries: Normal coronary origins.  Right dominance. No significant incidental noncardiac findings. Normal thoracic aorta.  Echocardiogram 08/25/2022:  Normal LV systolic function with visual EF 60-65%. Left ventricle cavity is normal in size. Normal left ventricular wall thickness. Normal global wall motion. Normal diastolic filling pattern, normal LAP. Calculated EF  65%. Structurally normal tricuspid valve with trace regurgitation. No evidence of pulmonary hypertension. RVSP measures 30 mmHg. No prior available for comparison.  EKG:    EKG Interpretation Date/Time:  Monday June 08 2023 09:04:55 EST Ventricular Rate:  47 PR Interval:  146 QRS Duration:  90 QT Interval:  454 QTC Calculation: 401 R Axis:   33  Text Interpretation: EKG 06/08/2023: Normal sinus rhythm/sinus bradycardia at the rate of 47 bpm, low voltage complexes otherwise normal EKG.  No significant change from 04/16/2022, sinus bradycardia new. Confirmed by Delrae Rend [phone]) on 06/08/2023 9:13:50 AM    EKG 04/16/2022: Normal sinus rhythm with rate of 74 bpm, normal axis, no evidence of ischemia, normal EKG.   Medications and allergies    No Known Allergies   Current Outpatient Medications:    albuterol (VENTOLIN HFA) 108 (90 Base) MCG/ACT inhaler, Inhale 2 puffs into the lungs every 6 (six) hours as needed for wheezing or shortness of breath., Disp: 8 g, Rfl: 11   ALPRAZolam (XANAX) 0.25 MG tablet, Take 1 tablet by mouth in the morning, at noon, and at bedtime. As needed for Panic /Anxiety, Disp: , Rfl:    Budeson-Glycopyrrol-Formoterol (BREZTRI AEROSPHERE) 160-9-4.8 MCG/ACT AERO, Inhale 2 puffs into the lungs in the morning and at bedtime., Disp: 10.7 g, Rfl: 11   Cholecalciferol (D-3-5) 125 MCG (5000 UT) capsule, Take 5,000 Units by mouth daily., Disp: , Rfl:    ezetimibe (ZETIA) 10 MG tablet, TAKE 1 TABLET BY MOUTH ONCE DAILY IN THE EVENING, Disp: 90 tablet, Rfl: 0   losartan (COZAAR) 100 MG tablet, Take 1 tablet (100 mg total) by mouth every evening., Disp: 90 tablet, Rfl: 3   meloxicam (MOBIC) 15 MG tablet, Take 1 tablet by mouth once daily, Disp: 30 tablet, Rfl: 1   omeprazole (PRILOSEC) 40 MG capsule, TAKE 1 CAPSULE BY MOUTH ONCE DAILY BEFORE BREAKFAST, Disp: 30 capsule, Rfl: 0   propranolol (INDERAL) 20 MG tablet, Take 1 tablet (20 mg total) by mouth 3 (three) times  daily. Take extra tablet if SBP > 150 or or anxiety, Disp: 280 tablet, Rfl: 1   rosuvastatin (CRESTOR) 10 MG tablet, Take 0.5 tablets (5 mg total) by mouth at bedtime., Disp: 90 tablet, Rfl: 0   Spacer/Aero-Holding Chambers (EQ SPACE CHAMBER ANTI-STATIC) DEVI, USE AS DIRECTED, Disp: 1 each, Rfl: 0   topiramate (TOPAMAX) 25 MG capsule, Take 25 mg by mouth daily., Disp: , Rfl:    nitroGLYCERIN (NITROSTAT) 0.4 MG SL tablet, Place 1 tablet (0.4 mg total) under the tongue every 5 (five) minutes as needed for up to 25 days for chest pain., Disp: 25 tablet, Rfl: 3   ASSESSMENT AND PLAN: .      ICD-10-CM   1. Precordial pain  R07.2 EKG 12-Lead    2. Palpitations  R00.2     3. Essential hypertension  I10     4. Pure hypercholesterolemia  E78.00     5. Generalized anxiety disorder  F41.1       1. Precordial pain (Primary) Patient presenting with precordial pain with radiation to her neck and also to her lips suggestive of angina pectoris.  Tingling and numbness in her feet is nonspecific.  She has multiple members in the family with coronary disease and patient is extremely anxious, best option is to proceed with cardiac catheterization and also if coronary arteries are normal, she will need endothelialized dysfunction/microvascular studies. - EKG 12-Lead  2. Palpitations Still has chronic palpitations, presently doing well with low-dose of propranolol, continue the same.  3. Essential hypertension Blood pressure is well-controlled on propranolol, losartan 100 mg daily, renal function has remained stable, external labs from recent hospitalization at [facility] emergency room visit reviewed, she presented there with chest pain on 05/26/2023.  4. Pure hypercholesterolemia Lipids now are well-controlled LDL <54.  5. Generalized anxiety disorder I think with her generalized anxiety disorder and past multiple members in the family including recently her uncle having had MI at a young age best option  is to proceed with cardiac catheterization for definitive diagnosis.  Schedule for cardiac catheterization, and possible angioplasty. We discussed regarding risks, benefits, alternatives to this including stress testing, CTA and continued medical therapy. Patient wants to proceed. Understands <1-2% risk of death, stroke, MI, urgent CABG, bleeding, infection, renal failure but not limited to these.    Assessment and Plan    Chest Pain and Paresthesia Recurrent episodes of chest discomfort, shortness of breath, and tingling in hands, face, and feet. Episodes have been increasing in frequency. Family history of sudden cardiac death. Prior evaluation in the emergency department did not reveal any acute abnormalities. -Schedule for cardiac catheterization to evaluate for possible coronary artery disease or microvascular dysfunction.  Weight Loss Significant weight loss of 28 pounds achieved through dietary modifications. -Encourage continuation of healthy dietary habits.  Follow-up after cardiac catheterization.         Informed Consent   Shared Decision Making/Informed Consent The risks [stroke (1 in 1000), death (1 in 1000), kidney failure [usually temporary] (1 in 500), bleeding (1 in 200), allergic reaction [possibly serious] (1 in 200)], benefits (diagnostic support and management of coronary artery disease) and alternatives of a cardiac catheterization were discussed in detail with Ms. Mckenzie Park and she is willing to proceed.      Signed,  Yates Decamp, MD, [facility] 06/08/2023, 11:20 AM [facility] [address] [place], [place] [address] Phone: [phone]. Fax:  [phone]

## 2023-06-07 NOTE — H&P (View-Only) (Signed)
Cardiology Office Note:  .   Date:  06/08/2023  ID:  Mckenzie Park, DOB 1975-08-12, MRN [id number] PCP: Alysia Penna, MD  [facility] Providers Cardiologist:  Yates Decamp, MD   History of Present Illness: .   Mckenzie Park is a 48 y.o. Caucasian female with hypertension, fibromyalgia, morbid obesity, family history of aortic dissection in her brother at age 57 died suddenly with aortic dissection, known to have CAD as well.  In view of her family history of premature coronary artery disease, father having had aortic valve disease and aortic valve replacement, she had normal coronary arteries and calcium score of 0 by CTA of the coronaries on 11/11/2019 with no significant aortic abnormality.   Discussed the use of AI scribe software for clinical note transcription with the patient, who gave verbal consent to proceed.  History of Present Illness   Mckenzie Park, a 48 year old with a significant family history of heart disease, presents with ongoing chest discomfort and tingling sensations in her hands, face, and feet. She has lost a significant amount of weight recently, approximately 28 pounds, by reducing her intake of salt and sugar and increasing her water consumption.  She describes episodes of shortness of breath, during which she feels as though someone is sitting on her chest. During these episodes, she experiences tingling in both hands, her mouth, and both feet. She has had three such episodes since December 30th. She also reports heart racing symptoms.  Mckenzie Park's symptoms have caused her significant concern, particularly in light of her family history of heart disease. Her mother's brother recently passed away suddenly from a heart attack, and both of her parents have a history of heart issues.  She has been taking nitroglycerin for her chest discomfort, but was advised by a nurse not to continue due to the age of the medication. She has been adhering to her prescribed medication  regimen, which is monitored by her husband.      Labs   External Labs:  Labs 04/01/2023:  Serum glucose 24 mg, BUN 15, creatinine 0.75, EGFR 99 mL, potassium 4.0.  Hb 13.1/HCT 41.7, platelets 255, normal indicis.  Total cholesterol 05/26/2021, triglycerides 63, HDL 57, LDL 53.  TSH normal at 2.280.  Review of Systems  Cardiovascular:  Positive for chest pain and palpitations. Negative for dyspnea on exertion and leg swelling.  Psychiatric/Behavioral:  The patient is nervous/anxious.     Physical Exam:   VS:  BP 122/78 (BP Location: Left Arm, Patient Position: Sitting, Cuff Size: Large)   Pulse (!) 47   Resp 16   Ht 5\' 5"  (1.651 m)   Wt 105.1 kg   SpO2 97%   BMI 38.54 kg/m    Wt Readings from Last 3 Encounters:  06/08/23 105.1 kg  04/28/23 100.1 kg  10/02/22 114.8 kg     Physical Exam Constitutional:      Appearance: She is obese.  Neck:     Vascular: No carotid bruit or JVD.  Cardiovascular:     Rate and Rhythm: Normal rate and regular rhythm.     Pulses: Intact distal pulses.     Heart sounds: Normal heart sounds. No murmur heard.    No gallop.  Pulmonary:     Effort: Pulmonary effort is normal.     Breath sounds: Normal breath sounds.  Abdominal:     General: Bowel sounds are normal.     Palpations: Abdomen is soft.  Musculoskeletal:     Right lower leg: No edema.  Left lower leg: No edema.     Studies Reviewed: Marland Kitchen    Coronary CT morphology 11/11/2019: Coronary calcium score: The patient's coronary artery calcium score is 0, which places the patient in the 0 percentile. Coronary arteries: Normal coronary origins.  Right dominance. No significant incidental noncardiac findings. Normal thoracic aorta.  Echocardiogram 08/25/2022:  Normal LV systolic function with visual EF 60-65%. Left ventricle cavity is normal in size. Normal left ventricular wall thickness. Normal global wall motion. Normal diastolic filling pattern, normal LAP. Calculated EF  65%. Structurally normal tricuspid valve with trace regurgitation. No evidence of pulmonary hypertension. RVSP measures 30 mmHg. No prior available for comparison.  EKG:    EKG Interpretation Date/Time:  Monday June 08 2023 09:04:55 EST Ventricular Rate:  47 PR Interval:  146 QRS Duration:  90 QT Interval:  454 QTC Calculation: 401 R Axis:   33  Text Interpretation: EKG 06/08/2023: Normal sinus rhythm/sinus bradycardia at the rate of 47 bpm, low voltage complexes otherwise normal EKG.  No significant change from 04/16/2022, sinus bradycardia new. Confirmed by Delrae Rend [phone]) on 06/08/2023 9:13:50 AM    EKG 04/16/2022: Normal sinus rhythm with rate of 74 bpm, normal axis, no evidence of ischemia, normal EKG.   Medications and allergies    No Known Allergies   Current Outpatient Medications:    albuterol (VENTOLIN HFA) 108 (90 Base) MCG/ACT inhaler, Inhale 2 puffs into the lungs every 6 (six) hours as needed for wheezing or shortness of breath., Disp: 8 g, Rfl: 11   ALPRAZolam (XANAX) 0.25 MG tablet, Take 1 tablet by mouth in the morning, at noon, and at bedtime. As needed for Panic /Anxiety, Disp: , Rfl:    Budeson-Glycopyrrol-Formoterol (BREZTRI AEROSPHERE) 160-9-4.8 MCG/ACT AERO, Inhale 2 puffs into the lungs in the morning and at bedtime., Disp: 10.7 g, Rfl: 11   Cholecalciferol (D-3-5) 125 MCG (5000 UT) capsule, Take 5,000 Units by mouth daily., Disp: , Rfl:    ezetimibe (ZETIA) 10 MG tablet, TAKE 1 TABLET BY MOUTH ONCE DAILY IN THE EVENING, Disp: 90 tablet, Rfl: 0   losartan (COZAAR) 100 MG tablet, Take 1 tablet (100 mg total) by mouth every evening., Disp: 90 tablet, Rfl: 3   meloxicam (MOBIC) 15 MG tablet, Take 1 tablet by mouth once daily, Disp: 30 tablet, Rfl: 1   omeprazole (PRILOSEC) 40 MG capsule, TAKE 1 CAPSULE BY MOUTH ONCE DAILY BEFORE BREAKFAST, Disp: 30 capsule, Rfl: 0   propranolol (INDERAL) 20 MG tablet, Take 1 tablet (20 mg total) by mouth 3 (three) times  daily. Take extra tablet if SBP > 150 or or anxiety, Disp: 280 tablet, Rfl: 1   rosuvastatin (CRESTOR) 10 MG tablet, Take 0.5 tablets (5 mg total) by mouth at bedtime., Disp: 90 tablet, Rfl: 0   Spacer/Aero-Holding Chambers (EQ SPACE CHAMBER ANTI-STATIC) DEVI, USE AS DIRECTED, Disp: 1 each, Rfl: 0   topiramate (TOPAMAX) 25 MG capsule, Take 25 mg by mouth daily., Disp: , Rfl:    nitroGLYCERIN (NITROSTAT) 0.4 MG SL tablet, Place 1 tablet (0.4 mg total) under the tongue every 5 (five) minutes as needed for up to 25 days for chest pain., Disp: 25 tablet, Rfl: 3   ASSESSMENT AND PLAN: .      ICD-10-CM   1. Precordial pain  R07.2 EKG 12-Lead    2. Palpitations  R00.2     3. Essential hypertension  I10     4. Pure hypercholesterolemia  E78.00     5. Generalized anxiety disorder  F41.1       1. Precordial pain (Primary) Patient presenting with precordial pain with radiation to her neck and also to her lips suggestive of angina pectoris.  Tingling and numbness in her feet is nonspecific.  She has multiple members in the family with coronary disease and patient is extremely anxious, best option is to proceed with cardiac catheterization and also if coronary arteries are normal, she will need endothelialized dysfunction/microvascular studies. - EKG 12-Lead  2. Palpitations Still has chronic palpitations, presently doing well with low-dose of propranolol, continue the same.  3. Essential hypertension Blood pressure is well-controlled on propranolol, losartan 100 mg daily, renal function has remained stable, external labs from recent hospitalization at [facility] emergency room visit reviewed, she presented there with chest pain on 05/26/2023.  4. Pure hypercholesterolemia Lipids now are well-controlled LDL <54.  5. Generalized anxiety disorder I think with her generalized anxiety disorder and past multiple members in the family including recently her uncle having had MI at a young age best option  is to proceed with cardiac catheterization for definitive diagnosis.  Schedule for cardiac catheterization, and possible angioplasty. We discussed regarding risks, benefits, alternatives to this including stress testing, CTA and continued medical therapy. Patient wants to proceed. Understands <1-2% risk of death, stroke, MI, urgent CABG, bleeding, infection, renal failure but not limited to these.    Assessment and Plan    Chest Pain and Paresthesia Recurrent episodes of chest discomfort, shortness of breath, and tingling in hands, face, and feet. Episodes have been increasing in frequency. Family history of sudden cardiac death. Prior evaluation in the emergency department did not reveal any acute abnormalities. -Schedule for cardiac catheterization to evaluate for possible coronary artery disease or microvascular dysfunction.  Weight Loss Significant weight loss of 28 pounds achieved through dietary modifications. -Encourage continuation of healthy dietary habits.  Follow-up after cardiac catheterization.         Informed Consent   Shared Decision Making/Informed Consent The risks [stroke (1 in 1000), death (1 in 1000), kidney failure [usually temporary] (1 in 500), bleeding (1 in 200), allergic reaction [possibly serious] (1 in 200)], benefits (diagnostic support and management of coronary artery disease) and alternatives of a cardiac catheterization were discussed in detail with Ms. Mckenzie Park and she is willing to proceed.      Signed,  Yates Decamp, MD, [facility] 06/08/2023, 11:20 AM [facility] [address] [place], [place] [address] Phone: [phone]. Fax:  [phone]

## 2023-06-08 ENCOUNTER — Encounter: Payer: Self-pay | Admitting: Cardiology

## 2023-06-08 ENCOUNTER — Ambulatory Visit: Payer: BC Managed Care – PPO | Attending: Cardiology | Admitting: Cardiology

## 2023-06-08 VITALS — BP 122/78 | HR 47 | Resp 16 | Ht 65.0 in | Wt 231.6 lb

## 2023-06-08 DIAGNOSIS — R002 Palpitations: Secondary | ICD-10-CM | POA: Diagnosis not present

## 2023-06-08 DIAGNOSIS — F411 Generalized anxiety disorder: Secondary | ICD-10-CM

## 2023-06-08 DIAGNOSIS — I10 Essential (primary) hypertension: Secondary | ICD-10-CM | POA: Diagnosis not present

## 2023-06-08 DIAGNOSIS — R072 Precordial pain: Secondary | ICD-10-CM | POA: Diagnosis not present

## 2023-06-08 DIAGNOSIS — E7800 Pure hypercholesterolemia, unspecified: Secondary | ICD-10-CM

## 2023-06-08 NOTE — Patient Instructions (Signed)
 Medication Instructions:  Your physician recommends that you continue on your current medications as directed. Please refer to the Current Medication list given to you today.  *If you need a refill on your cardiac medications before your next appointment, please call your pharmacy*   Lab Work: none If you have labs (blood work) drawn today and your tests are completely normal, you will receive your results only by: MyChart Message (if you have MyChart) OR A paper copy in the mail If you have any lab test that is abnormal or we need to change your treatment, we will call you to review the results.   Testing/Procedures: Your physician has requested that you have a cardiac catheterization. Cardiac catheterization is used to diagnose and/or treat various heart conditions. Doctors may recommend this procedure for a number of different reasons. The most common reason is to evaluate chest pain. Chest pain can be a symptom of coronary artery disease (CAD), and cardiac catheterization can show whether plaque is narrowing or blocking your heart's arteries. This procedure is also used to evaluate the valves, as well as measure the blood flow and oxygen levels in different parts of your heart. For further information please visit [email or web address] Please follow instruction sheet, as given. Scheduled for January 17   Follow-Up: At [facility], you and your health needs are our priority.  As part of our continuing mission to provide you with exceptional heart care, we have created designated Provider Care Teams.  These Care Teams include your primary Cardiologist (physician) and Advanced Practice Providers (APPs -  Physician Assistants and Nurse Practitioners) who all work together to provide you with the care you need, when you need it.  We recommend signing up for the patient portal called MyChart.  Sign up information is provided on this After Visit Summary.  MyChart is used to connect with  patients for Virtual Visits (Telemedicine).  Patients are able to view lab/test results, encounter notes, upcoming appointments, etc.  Non-urgent messages can be sent to your provider as well.   To learn more about what you can do with MyChart, go to [email or web address].    Your next appointment:   4 week(s)  Provider:   Orren Fabry, PA-C, Dayna Dunn, PA-C, Jackee Alberts, NP, Olivia Pavy, PA-C, Rosaline Bane, NP, Glendia Ferrier, PA-C, or Artist Pouch, PA-C         Other Instructions  [facility] A DEPT OF [place]. [facility] AT [facility] [address], [address] [place] [address] Dept: [phone] Loc: [phone]  Mckenzie Park  06/08/2023  You are scheduled for a Cardiac Catheterization on Friday, January 17 with Dr.  Ladona .  1. Please arrive at the [facility] (Main Entrance A) at [facility]: [address] [place], [place] [address] at 5:30 AM (This time is 2 hour(s) before your procedure to ensure your preparation).   Free valet parking service is available. You will check in at ADMITTING. The support person will be asked to wait in the waiting room.  It is OK to have someone drop you off and come back when you are ready to be discharged.    Special note: Every effort is made to have your procedure done on time. Please understand that emergencies sometimes delay scheduled procedures.  2. Diet: Do not eat solid foods after midnight.  The patient may have clear liquids until 5am upon the day of the procedure.  3. Labs: done at  [facility] on 12/31  4. Medication instructions in preparation for your procedure:   Contrast Allergy: No    On the morning of your procedure, take your Aspirin  81 mg and any morning medicines NOT listed above.  You may use sips of water.  5. Plan to go home the same day, you will only stay overnight if medically necessary. 6. Bring a current list of your medications and current  insurance cards. 7. You MUST have a responsible person to drive you home. 8. Someone MUST be with you the first 24 hours after you arrive home or your discharge will be delayed. 9. Please wear clothes that are easy to get on and off and wear slip-on shoes.  Thank you for allowing us  to care for you!   -- [facility]

## 2023-06-10 ENCOUNTER — Telehealth: Payer: Self-pay | Admitting: *Deleted

## 2023-06-10 NOTE — Telephone Encounter (Signed)
 Cardiac Catheterization scheduled at [facility] for: Friday June 12, 2023 7:30 AM Arrival time [facility] Main Entrance A at: 5:30 AM  Nothing to eat after midnight prior to procedure, clear liquids until 5 AM day of procedure.  Medication instructions: -Usual morning medications can be taken with sips of water including aspirin  81 mg.  Plan to go home the same day, you will only stay overnight if medically necessary.  You must have responsible adult to drive you home.  Someone must be with you the first 24 hours after you arrive home.  Reviewed procedure instructions with patient.

## 2023-06-11 ENCOUNTER — Other Ambulatory Visit: Payer: Self-pay | Admitting: Cardiology

## 2023-06-11 DIAGNOSIS — F411 Generalized anxiety disorder: Secondary | ICD-10-CM

## 2023-06-11 DIAGNOSIS — R002 Palpitations: Secondary | ICD-10-CM

## 2023-06-11 DIAGNOSIS — E7800 Pure hypercholesterolemia, unspecified: Secondary | ICD-10-CM

## 2023-06-11 DIAGNOSIS — I10 Essential (primary) hypertension: Secondary | ICD-10-CM

## 2023-06-12 ENCOUNTER — Encounter (HOSPITAL_COMMUNITY): Payer: Self-pay | Admitting: Cardiology

## 2023-06-12 ENCOUNTER — Other Ambulatory Visit: Payer: Self-pay

## 2023-06-12 ENCOUNTER — Encounter (HOSPITAL_COMMUNITY)
Admission: RE | Disposition: A | Discharge status: Home / Self Care | Payer: Self-pay | Source: Home / Self Care | Attending: Cardiology

## 2023-06-12 ENCOUNTER — Ambulatory Visit (HOSPITAL_COMMUNITY)
Admission: RE | Admit: 2023-06-12 | Discharge: 2023-06-12 | Disposition: A | Discharge status: Home / Self Care | Payer: BC Managed Care – PPO | Attending: Cardiology | Admitting: Cardiology

## 2023-06-12 DIAGNOSIS — F411 Generalized anxiety disorder: Secondary | ICD-10-CM | POA: Diagnosis not present

## 2023-06-12 DIAGNOSIS — Z6838 Body mass index (BMI) 38.0-38.9, adult: Secondary | ICD-10-CM | POA: Diagnosis not present

## 2023-06-12 DIAGNOSIS — R079 Chest pain, unspecified: Secondary | ICD-10-CM | POA: Diagnosis not present

## 2023-06-12 DIAGNOSIS — R072 Precordial pain: Secondary | ICD-10-CM | POA: Diagnosis not present

## 2023-06-12 DIAGNOSIS — R002 Palpitations: Secondary | ICD-10-CM | POA: Diagnosis not present

## 2023-06-12 DIAGNOSIS — Z8249 Family history of ischemic heart disease and other diseases of the circulatory system: Secondary | ICD-10-CM | POA: Diagnosis not present

## 2023-06-12 DIAGNOSIS — E7800 Pure hypercholesterolemia, unspecified: Secondary | ICD-10-CM | POA: Diagnosis not present

## 2023-06-12 DIAGNOSIS — R634 Abnormal weight loss: Secondary | ICD-10-CM | POA: Diagnosis not present

## 2023-06-12 DIAGNOSIS — I10 Essential (primary) hypertension: Secondary | ICD-10-CM | POA: Diagnosis not present

## 2023-06-12 DIAGNOSIS — Z79899 Other long term (current) drug therapy: Secondary | ICD-10-CM | POA: Insufficient documentation

## 2023-06-12 DIAGNOSIS — R202 Paresthesia of skin: Secondary | ICD-10-CM | POA: Insufficient documentation

## 2023-06-12 HISTORY — PX: CORONARY PRESSURE/FFR STUDY: CATH118243

## 2023-06-12 HISTORY — PX: LEFT HEART CATH AND CORONARY ANGIOGRAPHY: CATH118249

## 2023-06-12 LAB — POCT ACTIVATED CLOTTING TIME: Activated Clotting Time: 222 s

## 2023-06-12 SURGERY — LEFT HEART CATH AND CORONARY ANGIOGRAPHY
Anesthesia: LOCAL

## 2023-06-12 MED ORDER — NITROGLYCERIN 1 MG/10 ML FOR IR/CATH LAB
INTRA_ARTERIAL | Status: AC
  Filled 2023-06-12: qty 10

## 2023-06-12 MED ORDER — MIDAZOLAM HCL 2 MG/2ML IJ SOLN
INTRAMUSCULAR | Status: DC | PRN
  Administered 2023-06-12: 1 mg via INTRAVENOUS
  Administered 2023-06-12: 2 mg via INTRAVENOUS

## 2023-06-12 MED ORDER — MIDAZOLAM HCL 2 MG/2ML IJ SOLN
INTRAMUSCULAR | Status: AC
  Filled 2023-06-12: qty 2

## 2023-06-12 MED ORDER — HEPARIN (PORCINE) IN NACL 1000-0.9 UT/500ML-% IV SOLN
INTRAVENOUS | Status: DC | PRN
  Administered 2023-06-12 (×3): 500 mL

## 2023-06-12 MED ORDER — ADENOSINE (DIAGNOSTIC) 140MCG/KG/MIN
INTRAVENOUS | Status: DC | PRN
  Administered 2023-06-12: 140 ug/kg/min via INTRAVENOUS

## 2023-06-12 MED ORDER — ACETAMINOPHEN 325 MG PO TABS
650.0000 mg | ORAL_TABLET | ORAL | Status: DC | PRN
  Administered 2023-06-12: 650 mg via ORAL

## 2023-06-12 MED ORDER — MIDAZOLAM HCL 2 MG/2ML IJ SOLN
INTRAMUSCULAR | Status: AC
Start: 2023-06-12 — End: ?
  Filled 2023-06-12: qty 2

## 2023-06-12 MED ORDER — SODIUM CHLORIDE 0.9 % WEIGHT BASED INFUSION: 1.0000 mL/kg/h | INTRAVENOUS | Status: DC

## 2023-06-12 MED ORDER — FENTANYL CITRATE (PF) 100 MCG/2ML IJ SOLN
INTRAMUSCULAR | Status: AC
  Filled 2023-06-12: qty 2

## 2023-06-12 MED ORDER — SODIUM CHLORIDE 0.9 % IV SOLN
INTRAVENOUS | Status: DC | PRN
  Administered 2023-06-12: 150 mL/h via INTRAVENOUS

## 2023-06-12 MED ORDER — HEPARIN SODIUM (PORCINE) 1000 UNIT/ML IJ SOLN
INTRAMUSCULAR | Status: DC | PRN
  Administered 2023-06-12 (×2): 2000 [IU] via INTRAVENOUS
  Administered 2023-06-12: 6000 [IU] via INTRAVENOUS

## 2023-06-12 MED ORDER — LIDOCAINE HCL (PF) 1 % IJ SOLN
INTRAMUSCULAR | Status: AC
  Filled 2023-06-12: qty 30

## 2023-06-12 MED ORDER — LIDOCAINE HCL (PF) 1 % IJ SOLN
INTRAMUSCULAR | Status: DC | PRN
  Administered 2023-06-12: 2 mL

## 2023-06-12 MED ORDER — SODIUM CHLORIDE 0.9 % WEIGHT BASED INFUSION: 3.0000 mL/kg/h | INTRAVENOUS | Status: AC

## 2023-06-12 MED ORDER — SODIUM CHLORIDE 0.9% FLUSH: 3.0000 mL | Freq: Two times a day (BID) | INTRAVENOUS | Status: DC

## 2023-06-12 MED ORDER — ONDANSETRON HCL 4 MG/2ML IJ SOLN: 4.0000 mg | Freq: Four times a day (QID) | INTRAMUSCULAR | Status: DC | PRN

## 2023-06-12 MED ORDER — HEPARIN SODIUM (PORCINE) 1000 UNIT/ML IJ SOLN
INTRAMUSCULAR | Status: AC
  Filled 2023-06-12: qty 10

## 2023-06-12 MED ORDER — ACETAMINOPHEN 325 MG PO TABS
ORAL_TABLET | ORAL | Status: AC
  Filled 2023-06-12: qty 2

## 2023-06-12 MED ORDER — NITROGLYCERIN 1 MG/10 ML FOR IR/CATH LAB
INTRA_ARTERIAL | Status: DC | PRN
  Administered 2023-06-12: 300 ug via INTRACORONARY
  Administered 2023-06-12: 200 ug via INTRACORONARY

## 2023-06-12 MED ORDER — ASPIRIN 81 MG PO CHEW
81.0000 mg | CHEWABLE_TABLET | ORAL | Status: AC
  Administered 2023-06-12: 81 mg via ORAL

## 2023-06-12 MED ORDER — ONDANSETRON HCL 4 MG/2ML IJ SOLN
INTRAMUSCULAR | Status: DC | PRN
  Administered 2023-06-12: 4 mg via INTRAVENOUS

## 2023-06-12 MED ORDER — VERAPAMIL HCL 2.5 MG/ML IV SOLN
INTRAVENOUS | Status: AC
  Filled 2023-06-12: qty 2

## 2023-06-12 MED ORDER — IOHEXOL 350 MG/ML SOLN
INTRAVENOUS | Status: DC | PRN
  Administered 2023-06-12: 40 mL

## 2023-06-12 MED ORDER — VERAPAMIL HCL 2.5 MG/ML IV SOLN
INTRAVENOUS | Status: DC | PRN
  Administered 2023-06-12: 10 mL via INTRA_ARTERIAL

## 2023-06-12 MED ORDER — FENTANYL CITRATE (PF) 100 MCG/2ML IJ SOLN
INTRAMUSCULAR | Status: DC | PRN
  Administered 2023-06-12: 25 ug via INTRAVENOUS
  Administered 2023-06-12: 50 ug via INTRAVENOUS

## 2023-06-12 SURGICAL SUPPLY — 13 items
CATH DIAG 6FR JL4 (CATHETERS) IMPLANT
CATH INFINITI 5FR ANG PIGTAIL (CATHETERS) IMPLANT
CATH INFINITI AMBI 5FR TG (CATHETERS) IMPLANT
DEVICE RAD COMP TR BAND LRG (VASCULAR PRODUCTS) IMPLANT
GLIDESHEATH SLEND SS 6F .021 (SHEATH) IMPLANT
GUIDEWIRE INQWIRE 1.5J.035X260 (WIRE) IMPLANT
GUIDEWIRE PRESSURE X 175 (WIRE) IMPLANT
INQWIRE 1.5J .035X260CM (WIRE) ×1 IMPLANT
KIT ESSENTIALS PG (KITS) IMPLANT
KIT SYRINGE INJ CVI SPIKEX1 (MISCELLANEOUS) IMPLANT
PACK CARDIAC CATHETERIZATION (CUSTOM PROCEDURE TRAY) ×1 IMPLANT
SET ATX-X65L (MISCELLANEOUS) IMPLANT
SHEATH PROBE COVER 6X72 (BAG) IMPLANT

## 2023-06-12 NOTE — Interval H&P Note (Signed)
History and Physical Interval Note:  06/12/2023 7:54 AM  Mckenzie Park  has presented today for surgery, with the diagnosis of chest pain.  The various methods of treatment have been discussed with the patient and family. After consideration of risks, benefits and other options for treatment, the patient has consented to  Procedure(s): LEFT HEART CATH AND CORONARY ANGIOGRAPHY (N/A) and possible angioplasty as a surgical intervention.  The patient's history has been reviewed, patient examined, no change in status, stable for surgery.  I have reviewed the patient's chart and labs.  Questions were answered to the patient's satisfaction.     Yates Decamp

## 2023-06-17 ENCOUNTER — Telehealth: Payer: Self-pay | Admitting: Cardiology

## 2023-06-17 DIAGNOSIS — Z0279 Encounter for issue of other medical certificate: Secondary | ICD-10-CM

## 2023-06-17 NOTE — Telephone Encounter (Signed)
[employer] FMLA forms placed in Dr. Verl Dicker box.

## 2023-06-17 NOTE — Telephone Encounter (Signed)
Completed paperwork placed in FMLA basket at front desk

## 2023-06-18 NOTE — Telephone Encounter (Signed)
[employer] FMLA form faxed to insurance and scanned into chart.  Original mailed to patient.

## 2023-07-08 NOTE — Progress Notes (Unsigned)
Cardiology Office Note    Patient Name: Mckenzie Park Date of Encounter: 07/08/2023  Primary Care Provider:  Alysia Penna, MD Primary Cardiologist:  Yates Decamp, MD Primary Electrophysiologist: None   Past Medical History    Past Medical History:  Diagnosis Date   Anxiety    Depression    GERD (gastroesophageal reflux disease)    Heart murmur    irregular heart beat is on medication   Hyperlipidemia    Hypertension    Kidney stone     History of Present Illness  Mckenzie Park is a 48 y.o. female with a PMH of HTN, HLD, morbid obesity, family history of premature CAD, generalized anxiety disorder, fibromyalgia, palpitations who presents for 4-week follow-up.  Mckenzie Park was seen initially by Dr. Jacinto Halim in 2020 for complaint of chest pain and palpitations.  She completed a previous treadmill stress test in 2019 that showed excellent exercise tolerance and no evidence of ischemia.  2D echo was also completed in 2016 that showed mild to moderate MR and TR.  She continued to have complaint of chest pain with dyspnea and palpitations and underwent a coronary CTA that showed a calcium score of 0 with no significant aortic abnormalities.  She underwent TTE on 08/2022 that showed normal EF of 60-65% with no RWMA and trace TVR.    She was seen in follow-up on 06/08/2023 and had lost a significant amount of weight approximately 20 pounds and describes shortness of breath with chest pressure and tingling in both hands, mouth, and feet.  She also endorsed palpitations.  She was extremely anxious and underwent an LHC for further evaluation that showed normal coronaries with normal micro circular function and catheter induced LAD spasm and proximal LAD.  Mckenzie Park presents today for post heart catheterization follow-up.   She underwent a heart catheterization procedure, which revealed a spasm in the left coronary artery but no blockages.  During today's visit we reviewed her heart cath results and films  with all questions answered to patient's satisfaction.  Despite this, she continues to experience chest pressure and tingling in her hands and feet. She also reports stress due to recent family losses and her work schedule, which may be contributing to her symptoms. She has been using nitroglycerin at work when she experiences chest tightness and shortness of breath. She also reports pain in her arm, which she describes as feeling like she's been holding a box for a long time. She has a history of fibromyalgia and is experiencing symptoms that suggest potential neuropathy, including burning and pins and needles sensations in her feet.  I advised her to follow-up with her PCP for further evaluation of her arm pain.  Review of Systems  Please see the history of present illness.    All other systems reviewed and are otherwise negative except as noted above.  Physical Exam    Wt Readings from Last 3 Encounters:  06/12/23 225 lb (102.1 kg)  06/08/23 231 lb 9.6 oz (105.1 kg)  04/28/23 220 lb 9.6 oz (100.1 kg)   WU:JWJXB were no vitals filed for this visit.,There is no height or weight on file to calculate BMI. GEN: Well nourished, well developed in no acute distress Neck: No JVD; No carotid bruits Pulmonary: Clear to auscultation without rales, wheezing or rhonchi  Cardiovascular: Normal rate. Regular, right radial site clean dry and intact with no evidence of swelling, hematoma or ecchymosis.  You are welcome rhythm. Normal S1. Normal S2.   Murmurs: There is  no murmur.  ABDOMEN: Soft, non-tender, non-distended EXTREMITIES:  No edema; No deformity   EKG/LABS/ Recent Cardiac Studies   ECG personally reviewed by me today -none completed today  Risk Assessment/Calculations:          Lab Results  Component Value Date   WBC 9.2 05/28/2021   HGB 13.5 05/28/2021   HCT 42.5 05/28/2021   MCV 88.7 05/28/2021   PLT 334 05/28/2021   Lab Results  Component Value Date   CREATININE 0.56 05/28/2021    BUN 15 05/28/2021   NA 137 05/28/2021   K 4.5 05/28/2021   CL 102 05/28/2021   CO2 29 05/28/2021   No results found for: "CHOL", "HDL", "LDLCALC", "LDLDIRECT", "TRIG", "CHOLHDL"  No results found for: "HGBA1C" Assessment & Plan    1.  Precordial chest pain: -s/p LHC performed on 06/12/2023 with normal coronaries and reduced proximal LAD spasm -Today patient reports ongoing chest pain that she describes as substernal and has been relieved with.  Nitroglycerin -We will start patient on Imdur 30 mg daily patient has been advised and instructed regarding ED precautions and directions for using nitroglycerin as needed.  -Continue propranolol 20 mg 3 times, Crestor 10 mg daily and ezetimibe 10 mg daily  2.  Essential hypertension: -Patient's blood pressure today was controlled at 112/82 -Continue propranolol 20 mg 3 times daily  3.  Palpitations: -Today patient reports no recurrence in currently asymptomatic. -Continue propranolol 20 mg 3 times daily  4.  Pure hypercholesteremia: -Patient's last LDL cholesterol was controlled at 53 -Continue ezetimibe 10 mg and Crestor 10 mg  5.Gastroesophageal Reflux Disease (GERD) -Currently on Prilosec, but still experiencing symptoms. -Discontinue Prilosec. -Start Protonix 20mg  daily, can increase to 40mg  if needed.  Disposition: Follow-up with Yates Decamp, MD or APP in as needed months   Signed, Napoleon Form, Leodis Rains, NP 07/08/2023, 9:30 AM [facility]

## 2023-07-09 ENCOUNTER — Encounter: Payer: Self-pay | Admitting: Nurse Practitioner

## 2023-07-09 ENCOUNTER — Ambulatory Visit: Payer: BC Managed Care – PPO | Attending: Nurse Practitioner | Admitting: Nurse Practitioner

## 2023-07-09 VITALS — BP 112/82 | HR 49 | Ht 66.0 in | Wt 227.4 lb

## 2023-07-09 DIAGNOSIS — I10 Essential (primary) hypertension: Secondary | ICD-10-CM | POA: Diagnosis not present

## 2023-07-09 DIAGNOSIS — R002 Palpitations: Secondary | ICD-10-CM | POA: Diagnosis not present

## 2023-07-09 DIAGNOSIS — R072 Precordial pain: Secondary | ICD-10-CM | POA: Diagnosis not present

## 2023-07-09 DIAGNOSIS — E7800 Pure hypercholesterolemia, unspecified: Secondary | ICD-10-CM

## 2023-07-09 DIAGNOSIS — K219 Gastro-esophageal reflux disease without esophagitis: Secondary | ICD-10-CM

## 2023-07-09 MED ORDER — ISOSORBIDE MONONITRATE ER 30 MG PO TB24: 30.0000 mg | ORAL_TABLET | Freq: Every day | ORAL | 11 refills | Status: DC

## 2023-07-09 MED ORDER — PANTOPRAZOLE SODIUM 20 MG PO TBEC: 20.0000 mg | DELAYED_RELEASE_TABLET | Freq: Every day | ORAL | 11 refills | Status: DC

## 2023-07-09 NOTE — Patient Instructions (Signed)
Medication Instructions:  1.Stop prilosec 2.Start pantoprazole (Protonix) 20 mg daily 3.Start isosorbide mononitrate (Imdur) 30 mg daily *If you need a refill on your cardiac medications before your next appointment, please call your pharmacy*  Lab Work: None ordered If you have labs (blood work) drawn today and your tests are completely normal, you will receive your results only by: MyChart Message (if you have MyChart) OR A paper copy in the mail If you have any lab test that is abnormal or we need to change your treatment, we will call you to review the results.  Follow-Up: At [facility], you and your health needs are our priority.  As part of our continuing mission to provide you with exceptional heart care, we have created designated Provider Care Teams.  These Care Teams include your primary Cardiologist (physician) and Advanced Practice Providers (APPs -  Physician Assistants and Nurse Practitioners) who all work together to provide you with the care you need, when you need it.  Your next appointment:   As needed  Other Instructions You can use ice and heat on your right shoulder and take over the counter tylenol as needed for discomfort. Follow up with your primary care provider on this.  Heart-Healthy Eating Plan Many factors influence your heart health, including eating and exercise habits. Heart health is also called coronary health. Coronary risk increases with abnormal blood fat (lipid) levels. A heart-healthy eating plan includes limiting unhealthy fats, increasing healthy fats, limiting salt (sodium) intake, and making other diet and lifestyle changes. What is my plan? Your health care provider may recommend that: You limit your fat intake to _________% or less of your total calories each day. You limit your saturated fat intake to _________% or less of your total calories each day. You limit the amount of cholesterol in your diet to less than _________ mg per  day. You limit the amount of sodium in your diet to less than _________ mg per day. What are tips for following this plan? Cooking Cook foods using methods other than frying. Baking, boiling, grilling, and broiling are all good options. Other ways to reduce fat include: Removing the skin from poultry. Removing all visible fats from meats. Steaming vegetables in water or broth. Meal planning  At meals, imagine dividing your plate into fourths: Fill one-half of your plate with vegetables and green salads. Fill one-fourth of your plate with whole grains. Fill one-fourth of your plate with lean protein foods. Eat 2-4 cups of vegetables per day. One cup of vegetables equals 1 cup (91 g) broccoli or cauliflower florets, 2 medium carrots, 1 large bell pepper, 1 large sweet potato, 1 large tomato, 1 medium white potato, 2 cups (150 g) raw leafy greens. Eat 1-2 cups of fruit per day. One cup of fruit equals 1 small apple, 1 large banana, 1 cup (237 g) mixed fruit, 1 large orange,  cup (82 g) dried fruit, 1 cup (240 mL) 100% fruit juice. Eat more foods that contain soluble fiber. Examples include apples, broccoli, carrots, beans, peas, and barley. Aim to get 25-30 g of fiber per day. Increase your consumption of legumes, nuts, and seeds to 4-5 servings per week. One serving of dried beans or legumes equals  cup (90 g) cooked, 1 serving of nuts is  oz (12 almonds, 24 pistachios, or 7 walnut halves), and 1 serving of seeds equals  oz (8 g). Fats Choose healthy fats more often. Choose monounsaturated and polyunsaturated fats, such as olive and canola  oils, avocado oil, flaxseeds, walnuts, almonds, and seeds. Eat more omega-3 fats. Choose salmon, mackerel, sardines, tuna, flaxseed oil, and ground flaxseeds. Aim to eat fish at least 2 times each week. Check food labels carefully to identify foods with trans fats or high amounts of saturated fat. Limit saturated fats. These are found in animal products,  such as meats, butter, and cream. Plant sources of saturated fats include palm oil, palm kernel oil, and coconut oil. Avoid foods with partially hydrogenated oils in them. These contain trans fats. Examples are stick margarine, some tub margarines, cookies, crackers, and other baked goods. Avoid fried foods. General information Eat more home-cooked food and less restaurant, buffet, and fast food. Limit or avoid alcohol. Limit foods that are high in added sugar and simple starches such as foods made using white refined flour (white breads, pastries, sweets). Lose weight if you are overweight. Losing just 5-10% of your body weight can help your overall health and prevent diseases such as diabetes and heart disease. Monitor your sodium intake, especially if you have high blood pressure. Talk with your health care provider about your sodium intake. Try to incorporate more vegetarian meals weekly. What foods should I eat? Fruits All fresh, canned (in natural juice), or frozen fruits. Vegetables Fresh or frozen vegetables (raw, steamed, roasted, or grilled). Green salads. Grains Most grains. Choose whole wheat and whole grains most of the time. Rice and pasta, including brown rice and pastas made with whole wheat. Meats and other proteins Lean, well-trimmed beef, veal, pork, and lamb. Chicken and [place] without skin. All fish and shellfish. Wild duck, rabbit, pheasant, and venison. Egg whites or low-cholesterol egg substitutes. Dried beans, peas, lentils, and tofu. Seeds and most nuts. Dairy Low-fat or nonfat cheeses, including ricotta and mozzarella. Skim or 1% milk (liquid, powdered, or evaporated). Buttermilk made with low-fat milk. Nonfat or low-fat yogurt. Fats and oils Non-hydrogenated (trans-free) margarines. Vegetable oils, including soybean, sesame, sunflower, olive, avocado, peanut, safflower, corn, canola, and cottonseed. Salad dressings or mayonnaise made with a vegetable  oil. Beverages Water (mineral or sparkling). Coffee and tea. Unsweetened ice tea. Diet beverages. Sweets and desserts Sherbet, gelatin, and fruit ice. Small amounts of dark chocolate. Limit all sweets and desserts. Seasonings and condiments All seasonings and condiments. The items listed above may not be a complete list of foods and beverages you can eat. Contact a dietitian for more options. What foods should I avoid? Fruits Canned fruit in heavy syrup. Fruit in cream or butter sauce. Fried fruit. Limit coconut. Vegetables Vegetables cooked in cheese, cream, or butter sauce. Fried vegetables. Grains Breads made with saturated or trans fats, oils, or whole milk. Croissants. Sweet rolls. Donuts. High-fat crackers, such as cheese crackers and chips. Meats and other proteins Fatty meats, such as hot dogs, ribs, sausage, bacon, rib-eye roast or steak. High-fat deli meats, such as salami and bologna. Caviar. Domestic duck and goose. Organ meats, such as liver. Dairy Cream, sour cream, cream cheese, and creamed cottage cheese. Whole-milk cheeses. Whole or 2% milk (liquid, evaporated, or condensed). Whole buttermilk. Cream sauce or high-fat cheese sauce. Whole-milk yogurt. Fats and oils Meat fat, or shortening. Cocoa butter, hydrogenated oils, palm oil, coconut oil, palm kernel oil. Solid fats and shortenings, including bacon fat, salt pork, lard, and butter. Nondairy cream substitutes. Salad dressings with cheese or sour cream. Beverages Regular sodas and any drinks with added sugar. Sweets and desserts Frosting. Pudding. Cookies. Cakes. Pies. Milk chocolate or white chocolate. Buttered syrups. Full-fat ice cream  or ice cream drinks. The items listed above may not be a complete list of foods and beverages to avoid. Contact a dietitian for more information. Summary Heart-healthy meal planning includes limiting unhealthy fats, increasing healthy fats, limiting salt (sodium) intake and making  other diet and lifestyle changes. Lose weight if you are overweight. Losing just 5-10% of your body weight can help your overall health and prevent diseases such as diabetes and heart disease. Focus on eating a balance of foods, including fruits and vegetables, low-fat or nonfat dairy, lean protein, nuts and legumes, whole grains, and heart-healthy oils and fats. This information is not intended to replace advice given to you by your health care provider. Make sure you discuss any questions you have with your health care provider. Document Revised: 06/17/2021 Document Reviewed: 06/17/2021 Elsevier Patient Education  2024 [employer].

## 2023-09-15 ENCOUNTER — Other Ambulatory Visit: Payer: Self-pay | Admitting: Cardiology

## 2023-09-15 DIAGNOSIS — E7800 Pure hypercholesterolemia, unspecified: Secondary | ICD-10-CM

## 2023-09-28 ENCOUNTER — Other Ambulatory Visit: Payer: Self-pay | Admitting: Cardiology

## 2023-09-28 DIAGNOSIS — I10 Essential (primary) hypertension: Secondary | ICD-10-CM

## 2023-10-14 ENCOUNTER — Encounter (HOSPITAL_COMMUNITY): Payer: Self-pay

## 2023-10-15 ENCOUNTER — Other Ambulatory Visit: Payer: Self-pay | Admitting: Pulmonary Disease

## 2023-10-17 ENCOUNTER — Inpatient Hospital Stay (HOSPITAL_COMMUNITY)

## 2023-10-17 ENCOUNTER — Encounter (HOSPITAL_COMMUNITY): Payer: Self-pay

## 2023-10-17 ENCOUNTER — Emergency Department (HOSPITAL_COMMUNITY)

## 2023-10-17 ENCOUNTER — Other Ambulatory Visit: Payer: Self-pay

## 2023-10-17 ENCOUNTER — Inpatient Hospital Stay (HOSPITAL_COMMUNITY)
Admission: EM | Admit: 2023-10-17 | Discharge: 2023-10-19 | DRG: 315 | Disposition: A | Discharge status: Home / Self Care | Attending: Neurology | Admitting: Neurology

## 2023-10-17 DIAGNOSIS — Z7982 Long term (current) use of aspirin: Secondary | ICD-10-CM | POA: Diagnosis not present

## 2023-10-17 DIAGNOSIS — I071 Rheumatic tricuspid insufficiency: Secondary | ICD-10-CM | POA: Diagnosis present

## 2023-10-17 DIAGNOSIS — I639 Cerebral infarction, unspecified: Principal | ICD-10-CM | POA: Diagnosis present

## 2023-10-17 DIAGNOSIS — R002 Palpitations: Secondary | ICD-10-CM | POA: Diagnosis present

## 2023-10-17 DIAGNOSIS — F32A Depression, unspecified: Secondary | ICD-10-CM | POA: Diagnosis present

## 2023-10-17 DIAGNOSIS — G43109 Migraine with aura, not intractable, without status migrainosus: Secondary | ICD-10-CM | POA: Diagnosis present

## 2023-10-17 DIAGNOSIS — I959 Hypotension, unspecified: Principal | ICD-10-CM | POA: Diagnosis present

## 2023-10-17 DIAGNOSIS — I495 Sick sinus syndrome: Secondary | ICD-10-CM | POA: Diagnosis present

## 2023-10-17 DIAGNOSIS — Z832 Family history of diseases of the blood and blood-forming organs and certain disorders involving the immune mechanism: Secondary | ICD-10-CM

## 2023-10-17 DIAGNOSIS — I10 Essential (primary) hypertension: Secondary | ICD-10-CM | POA: Diagnosis present

## 2023-10-17 DIAGNOSIS — R29818 Other symptoms and signs involving the nervous system: Secondary | ICD-10-CM | POA: Diagnosis not present

## 2023-10-17 DIAGNOSIS — R0602 Shortness of breath: Secondary | ICD-10-CM | POA: Diagnosis not present

## 2023-10-17 DIAGNOSIS — K219 Gastro-esophageal reflux disease without esophagitis: Secondary | ICD-10-CM | POA: Diagnosis present

## 2023-10-17 DIAGNOSIS — G43909 Migraine, unspecified, not intractable, without status migrainosus: Secondary | ICD-10-CM | POA: Diagnosis present

## 2023-10-17 DIAGNOSIS — Z800 Family history of malignant neoplasm of digestive organs: Secondary | ICD-10-CM | POA: Diagnosis not present

## 2023-10-17 DIAGNOSIS — Z820 Family history of epilepsy and other diseases of the nervous system: Secondary | ICD-10-CM | POA: Diagnosis not present

## 2023-10-17 DIAGNOSIS — F419 Anxiety disorder, unspecified: Secondary | ICD-10-CM | POA: Diagnosis present

## 2023-10-17 DIAGNOSIS — E785 Hyperlipidemia, unspecified: Secondary | ICD-10-CM | POA: Diagnosis present

## 2023-10-17 DIAGNOSIS — I209 Angina pectoris, unspecified: Secondary | ICD-10-CM | POA: Diagnosis not present

## 2023-10-17 DIAGNOSIS — Z825 Family history of asthma and other chronic lower respiratory diseases: Secondary | ICD-10-CM

## 2023-10-17 DIAGNOSIS — Z7951 Long term (current) use of inhaled steroids: Secondary | ICD-10-CM | POA: Diagnosis not present

## 2023-10-17 DIAGNOSIS — J45909 Unspecified asthma, uncomplicated: Secondary | ICD-10-CM | POA: Diagnosis present

## 2023-10-17 DIAGNOSIS — R001 Bradycardia, unspecified: Secondary | ICD-10-CM

## 2023-10-17 DIAGNOSIS — I25119 Atherosclerotic heart disease of native coronary artery with unspecified angina pectoris: Secondary | ICD-10-CM | POA: Diagnosis present

## 2023-10-17 DIAGNOSIS — E669 Obesity, unspecified: Secondary | ICD-10-CM | POA: Diagnosis present

## 2023-10-17 DIAGNOSIS — G4733 Obstructive sleep apnea (adult) (pediatric): Secondary | ICD-10-CM | POA: Diagnosis present

## 2023-10-17 DIAGNOSIS — R202 Paresthesia of skin: Principal | ICD-10-CM

## 2023-10-17 DIAGNOSIS — Z79899 Other long term (current) drug therapy: Secondary | ICD-10-CM

## 2023-10-17 DIAGNOSIS — Z8249 Family history of ischemic heart disease and other diseases of the circulatory system: Secondary | ICD-10-CM

## 2023-10-17 DIAGNOSIS — R531 Weakness: Secondary | ICD-10-CM | POA: Diagnosis not present

## 2023-10-17 DIAGNOSIS — Z6841 Body Mass Index (BMI) 40.0 and over, adult: Secondary | ICD-10-CM | POA: Diagnosis not present

## 2023-10-17 DIAGNOSIS — R29700 NIHSS score 0: Secondary | ICD-10-CM | POA: Diagnosis present

## 2023-10-17 DIAGNOSIS — R200 Anesthesia of skin: Secondary | ICD-10-CM | POA: Diagnosis not present

## 2023-10-17 LAB — DIFFERENTIAL
Abs Immature Granulocytes: 0.01 10*3/uL (ref 0.00–0.07)
Basophils Absolute: 0.1 10*3/uL (ref 0.0–0.1)
Basophils Relative: 1 %
Eosinophils Absolute: 0.2 10*3/uL (ref 0.0–0.5)
Eosinophils Relative: 3 %
Immature Granulocytes: 0 %
Lymphocytes Relative: 21 %
Lymphs Abs: 1.5 10*3/uL (ref 0.7–4.0)
Monocytes Absolute: 0.5 10*3/uL (ref 0.1–1.0)
Monocytes Relative: 7 %
Neutro Abs: 4.8 10*3/uL (ref 1.7–7.7)
Neutrophils Relative %: 68 %

## 2023-10-17 LAB — COMPREHENSIVE METABOLIC PANEL WITH GFR
ALT: 30 U/L (ref 0–44)
AST: 46 U/L — ABNORMAL HIGH (ref 15–41)
Albumin: 3.3 g/dL — ABNORMAL LOW (ref 3.5–5.0)
Alkaline Phosphatase: 78 U/L (ref 38–126)
Anion gap: 6 (ref 5–15)
BUN: 12 mg/dL (ref 6–20)
CO2: 24 mmol/L (ref 22–32)
Calcium: 8.9 mg/dL (ref 8.9–10.3)
Chloride: 106 mmol/L (ref 98–111)
Creatinine, Ser: 1.01 mg/dL — ABNORMAL HIGH (ref 0.44–1.00)
GFR, Estimated: >60 mL/min (ref >60)
Glucose, Bld: 101 mg/dL — ABNORMAL HIGH (ref 70–99)
Potassium: 4.6 mmol/L (ref 3.5–5.1)
Sodium: 136 mmol/L (ref 135–145)
Total Bilirubin: 0.2 mg/dL (ref 0.0–1.2)
Total Protein: 6.3 g/dL — ABNORMAL LOW (ref 6.5–8.1)

## 2023-10-17 LAB — CBC
HCT: 34.9 % — ABNORMAL LOW (ref 36.0–46.0)
Hemoglobin: 11.3 g/dL — ABNORMAL LOW (ref 12.0–15.0)
MCH: 29.7 pg (ref 26.0–34.0)
MCHC: 32.4 g/dL (ref 30.0–36.0)
MCV: 91.6 fL (ref 80.0–100.0)
Platelets: 305 10*3/uL (ref 150–400)
RBC: 3.81 MIL/uL — ABNORMAL LOW (ref 3.87–5.11)
RDW: 14.1 % (ref 11.5–15.5)
WBC: 7 10*3/uL (ref 4.0–10.5)
nRBC: 0 % (ref 0.0–0.2)

## 2023-10-17 LAB — ECHOCARDIOGRAM COMPLETE
AR max vel: 1.79 cm2
AV Area VTI: 1.79 cm2
AV Area mean vel: 1.65 cm2
AV Mean grad: 4 mmHg
AV Peak grad: 8.9 mmHg
Ao pk vel: 1.49 m/s
Area-P 1/2: 1.76 cm2
Calc EF: 70.5 %
S' Lateral: 3.6 cm
Single Plane A2C EF: 76.5 %
Single Plane A4C EF: 62.9 %
Weight: 3992.97 [oz_av]

## 2023-10-17 LAB — I-STAT CHEM 8, ED
BUN: 13 mg/dL (ref 6–20)
Calcium, Ion: 1.16 mmol/L (ref 1.15–1.40)
Chloride: 106 mmol/L (ref 98–111)
Creatinine, Ser: 1.1 mg/dL — ABNORMAL HIGH (ref 0.44–1.00)
Glucose, Bld: 95 mg/dL (ref 70–99)
HCT: 35 % — ABNORMAL LOW (ref 36.0–46.0)
Hemoglobin: 11.9 g/dL — ABNORMAL LOW (ref 12.0–15.0)
Potassium: 4.6 mmol/L (ref 3.5–5.1)
Sodium: 138 mmol/L (ref 135–145)
TCO2: 22 mmol/L (ref 22–32)

## 2023-10-17 LAB — CBG MONITORING, ED: Glucose-Capillary: 107 mg/dL — ABNORMAL HIGH (ref 70–99)

## 2023-10-17 LAB — APTT: aPTT: 31 s (ref 24–36)

## 2023-10-17 LAB — MRSA NEXT GEN BY PCR, NASAL: MRSA by PCR Next Gen: NOT DETECTED

## 2023-10-17 LAB — PROTIME-INR
INR: 1 (ref 0.8–1.2)
Prothrombin Time: 13.5 s (ref 11.4–15.2)

## 2023-10-17 LAB — ETHANOL: Alcohol, Ethyl (B): <15 mg/dL (ref <15)

## 2023-10-17 MED ORDER — IOPAMIDOL (ISOVUE-370) INJECTION 76%
75.0000 mL | Freq: Once | INTRAVENOUS | Status: AC | PRN
  Administered 2023-10-17: 75 mL via INTRAVENOUS

## 2023-10-17 MED ORDER — SODIUM CHLORIDE 0.9 % IV BOLUS
500.0000 mL | Freq: Once | INTRAVENOUS | Status: AC
  Administered 2023-10-17: 500 mL via INTRAVENOUS

## 2023-10-17 MED ORDER — SENNOSIDES-DOCUSATE SODIUM 8.6-50 MG PO TABS: 1.0000 | ORAL_TABLET | Freq: Every evening | ORAL | Status: DC | PRN

## 2023-10-17 MED ORDER — SODIUM CHLORIDE 0.9% FLUSH: 3.0000 mL | Freq: Once | INTRAVENOUS | Status: DC

## 2023-10-17 MED ORDER — SODIUM CHLORIDE 0.9 % IV SOLN: INTRAVENOUS | Status: DC

## 2023-10-17 MED ORDER — BUDESON-GLYCOPYRROL-FORMOTEROL 160-9-4.8 MCG/ACT IN AERO
2.0000 | INHALATION_SPRAY | Freq: Two times a day (BID) | RESPIRATORY_TRACT | Status: DC
  Administered 2023-10-17: 2 via RESPIRATORY_TRACT
  Filled 2023-10-17: qty 5.9

## 2023-10-17 MED ORDER — STROKE: EARLY STAGES OF RECOVERY BOOK
Freq: Once | Status: AC
  Filled 2023-10-17: qty 1

## 2023-10-17 MED ORDER — ORAL CARE MOUTH RINSE: 15.0000 mL | OROMUCOSAL | Status: DC | PRN

## 2023-10-17 MED ORDER — ROSUVASTATIN CALCIUM 5 MG PO TABS
5.0000 mg | ORAL_TABLET | Freq: Every day | ORAL | Status: DC
  Administered 2023-10-17 – 2023-10-19 (×3): 5 mg via ORAL
  Filled 2023-10-17 (×3): qty 1

## 2023-10-17 MED ORDER — SULFAMETHOXAZOLE-TRIMETHOPRIM 800-160 MG PO TABS
1.0000 | ORAL_TABLET | Freq: Two times a day (BID) | ORAL | Status: DC
  Administered 2023-10-17 – 2023-10-19 (×4): 1 via ORAL
  Filled 2023-10-17 (×5): qty 1

## 2023-10-17 MED ORDER — ACETAMINOPHEN 650 MG RE SUPP: 650.0000 mg | RECTAL | Status: DC | PRN

## 2023-10-17 MED ORDER — ONDANSETRON 4 MG PO TBDP
4.0000 mg | ORAL_TABLET | Freq: Three times a day (TID) | ORAL | Status: DC | PRN
Start: 2023-10-17 — End: 2023-10-19

## 2023-10-17 MED ORDER — ALBUTEROL SULFATE (2.5 MG/3ML) 0.083% IN NEBU: 3.0000 mL | INHALATION_SOLUTION | Freq: Four times a day (QID) | RESPIRATORY_TRACT | Status: DC | PRN

## 2023-10-17 MED ORDER — PANTOPRAZOLE SODIUM 40 MG IV SOLR
40.0000 mg | Freq: Every day | INTRAVENOUS | Status: DC
  Administered 2023-10-17 – 2023-10-18 (×2): 40 mg via INTRAVENOUS
  Filled 2023-10-17 (×2): qty 10

## 2023-10-17 MED ORDER — TENECTEPLASE FOR STROKE
25.0000 mg | PACK | Freq: Once | INTRAVENOUS | Status: AC
  Administered 2023-10-17: 25 mg via INTRAVENOUS
  Filled 2023-10-17: qty 10

## 2023-10-17 MED ORDER — CHLORHEXIDINE GLUCONATE CLOTH 2 % EX PADS
6.0000 | MEDICATED_PAD | Freq: Every day | CUTANEOUS | Status: DC
  Administered 2023-10-17 – 2023-10-18 (×2): 6 via TOPICAL

## 2023-10-17 MED ORDER — ACETAMINOPHEN 325 MG PO TABS
650.0000 mg | ORAL_TABLET | ORAL | Status: DC | PRN
  Administered 2023-10-17 – 2023-10-19 (×4): 650 mg via ORAL
  Filled 2023-10-17 (×4): qty 2

## 2023-10-17 MED ORDER — ESCITALOPRAM OXALATE 10 MG PO TABS
20.0000 mg | ORAL_TABLET | Freq: Every day | ORAL | Status: DC
  Administered 2023-10-18 – 2023-10-19 (×2): 20 mg via ORAL
  Filled 2023-10-17 (×2): qty 2

## 2023-10-17 MED ORDER — ALPRAZOLAM 0.5 MG PO TABS
0.5000 mg | ORAL_TABLET | Freq: Three times a day (TID) | ORAL | Status: DC | PRN
  Administered 2023-10-18: 0.5 mg via ORAL
  Filled 2023-10-17: qty 1

## 2023-10-17 MED ORDER — EZETIMIBE 10 MG PO TABS
10.0000 mg | ORAL_TABLET | Freq: Every evening | ORAL | Status: DC
  Administered 2023-10-17 – 2023-10-18 (×2): 10 mg via ORAL
  Filled 2023-10-17 (×2): qty 1

## 2023-10-17 MED ORDER — VITAMIN D 25 MCG (1000 UNIT) PO TABS
5000.0000 [IU] | ORAL_TABLET | Freq: Every day | ORAL | Status: DC
  Administered 2023-10-18 – 2023-10-19 (×2): 5000 [IU] via ORAL
  Filled 2023-10-17 (×2): qty 5

## 2023-10-17 MED ORDER — PROPRANOLOL HCL 10 MG PO TABS: 10.0000 mg | ORAL_TABLET | Freq: Two times a day (BID) | ORAL | Status: DC | PRN

## 2023-10-17 MED ORDER — ACETAMINOPHEN 160 MG/5ML PO SOLN: 650.0000 mg | ORAL | Status: DC | PRN

## 2023-10-17 MED ORDER — BUDESON-GLYCOPYRROL-FORMOTEROL 160-9-4.8 MCG/ACT IN AERO
2.0000 | INHALATION_SPRAY | Freq: Two times a day (BID) | RESPIRATORY_TRACT | Status: DC
  Administered 2023-10-17 – 2023-10-19 (×4): 2 via RESPIRATORY_TRACT
  Filled 2023-10-17: qty 5.9

## 2023-10-17 NOTE — ED Notes (Signed)
 EKG given to MD allen

## 2023-10-17 NOTE — ED Notes (Signed)
Code stroke paged out.

## 2023-10-17 NOTE — Progress Notes (Signed)
 PHARMACIST CODE STROKE RESPONSE  Notified to mix TNK at 1050 by Dr. Janeal Mealing TNK preparation completed at 1052  TNK dose = 25 mg IV over 5 seconds  Issues/delays encountered (if applicable): n/a  Mckenzie Park 10/17/23 10:54 AM

## 2023-10-17 NOTE — ED Triage Notes (Signed)
 Pt came in via POV d/t decreased sensation that progressed to numbness on the Rt side of her face/mouth in her Rt arm & some in her Rt leg. Pt reports feeling lightheaded & her husband told her she appeared very pale during a dizzy episode she had while walking. LKN 0930, during triage pt continued to have Rt sided numbness on the Rt side of face/ Rt arm/Rt leg, Rt arm drift & dizziness, A/Ox4, speech clear. While on the Dinamap pt was noted to have moments of bradycardia as well before brought to CT scanner as Code Stroke.

## 2023-10-17 NOTE — Progress Notes (Signed)
 PT Cancellation Note  Patient Details Name: Mckenzie Park MRN: [id number] DOB: [birth year]   Cancelled Treatment:    Reason Eval/Treat Not Completed: Active bedrest order (Pt received TNK 1050). Will check on pt tomorrow.   Amey Ka, PT  Acute Rehab Services Secure chat preferred Office [phone]    Mckenzie Park Mckenzie Park 10/17/2023, 2:08 PM

## 2023-10-17 NOTE — Progress Notes (Signed)
*  PRELIMINARY RESULTS* Echocardiogram 2D Echocardiogram has been performed.  Mckenzie Park 10/17/2023, 2:51 PM

## 2023-10-17 NOTE — H&P (Addendum)
 NEUROLOGY CONSULT NOTE   Date of service: Oct 17, 2023 Patient Name: Mckenzie Park MRN:  [id number] DOB:  [birth year] Chief Complaint: "New onset right sided tingling and weakness" Requesting Provider: Lind Repine, MD  History of Present Illness  Mckenzie Park is a 48 y.o. female with hx of migraines never with focal neurological symptoms, bradycardia, asthma, hypertension, depression, overactive bladder and sleep apnea who presents with sudden onset right sided numbness and weakness of the right arm.    Patient states that she was in her usual state of health when she woke up this morning and went to the grocery store with her husband.  While she was there, she noticed some shortness of breath, dizziness, a mild headache and sudden onset numbness of the right face arm and leg with tingling in the fingers of the right hand and toes.  She also noted some weakness of the right upper extremity and difficulty using her right hand.    She was seen recently on 5/21 at another emergency department for pyelonephritis but denies any hematuria or blood in her stools.  She reports that she has had recent palpitations and some shortness of breath but no other recent illnesses or symptoms.  She was also noted to be bradycardic to the 30s at that hospitalization, for which her verapamil  was reduced from 180 mg nightly to 90 mg nightly and she was told to limit her propranolol  to twice daily instead of using a third dose for anxiety as needed   Discussed CODE STATUS with patient, and she would like to be full code at this time.  LKW: 9:30 AM Modified rankin score: 0  IV Thrombolysis: Yes, risks/benefits/alternatives discussed and checklist reviewed 10:54 AM EVT: No, no LVO  NIHSS components Score: Comment  1a Level of Conscious 0[x]  1[]  2[]  3[]      1b LOC Questions 0[]  1[x]  2[]       1c LOC Commands 0[x]  1[]  2[]       2 Best Gaze 0[x]  1[]  2[]       3 Visual 0[]  1[x]  2[]  3[]     Inconsistent   4 Facial  Palsy 0[x]  1[]  2[]  3[]      5a Motor Arm - left 0[x]  1[]  2[]  3[]  4[]  UN[]   Inconsistent, absent on distraction  5b Motor Arm - Right 0[]  1[x]  2[]  3[]  4[]  UN[]    6a Motor Leg - Left 0[x]  1[]  2[]  3[]  4[]  UN[]    6b Motor Leg - Right 0[]  1[x]  2[]  3[]  4[]  UN[]    7 Limb Ataxia 0[x]  1[]  2[]  UN[]      8 Sensory 0[]  1[x]  2[]  UN[]      9 Best Language 0[]  1[x]  2[]  3[]    Plant and bed instead of cactus and hammock  10 Dysarthria 0[x]  1[]  2[]  UN[]      11 Extinct. and Inattention 0[x]  1[]  2[]       TOTAL:       ROS  Comprehensive ROS performed and pertinent positives documented in HPI   Past History   Past Medical History:  Diagnosis Date   Anxiety    Depression    GERD (gastroesophageal reflux disease)    Heart murmur    irregular heart beat is on medication   Hyperlipidemia    Hypertension    Kidney stone     Past Surgical History:  Procedure Laterality Date   ABDOMINAL HYSTERECTOMY     CHOLECYSTECTOMY     CORONARY PRESSURE/FFR STUDY N/A 06/12/2023   Procedure: CORONARY PRESSURE/FFR STUDY;  Surgeon: Knox Perl, MD;  Location: [facility];  Service: Cardiovascular;  Laterality: N/A;   kidney stone removal     KNEE ARTHROSCOPY Bilateral    LEFT HEART CATH AND CORONARY ANGIOGRAPHY N/A 06/12/2023   Procedure: LEFT HEART CATH AND CORONARY ANGIOGRAPHY;  Surgeon: Knox Perl, MD;  Location: [facility] CV [facility];  Service: Cardiovascular;  Laterality: N/A;   TUBAL LIGATION      Family History: Family History  Problem Relation Age of Onset   Hypertension Mother    Heart disease Mother    COPD Mother    Parkinsonism Father    Hypertension Brother    AAA (abdominal aortic aneurysm) Brother 80   Heart attack Maternal Uncle 66   Esophageal cancer Paternal Uncle    Asthma Son    Colon cancer Neg Hx    Colon polyps Neg Hx    Rectal cancer Neg Hx    Stomach cancer Neg Hx     Social History  reports that she has never smoked. She has been exposed to tobacco smoke. She has never used  smokeless tobacco. She reports current alcohol  use. She reports that she does not use drugs.  No Known Allergies  Medications   Current Facility-Administered Medications:    sodium chloride  flush (NS) 0.9 % injection 3 mL, 3 mL, Intravenous, Once, Lind Repine, MD  Current Outpatient Medications:    albuterol  (VENTOLIN  HFA) 108 (90 Base) MCG/ACT inhaler, Inhale 2 puffs into the lungs every 6 (six) hours as needed for wheezing or shortness of breath., Disp: 8 g, Rfl: 11   ALPRAZolam (XANAX) 0.5 MG tablet, Take 0.5 mg by mouth 3 (three) times daily., Disp: , Rfl:    BREZTRI  AEROSPHERE 160-9-4.8 MCG/ACT AERO inhaler, INHALE 2 PUFFS IN THE MORNING AND AT BEDTIME, Disp: 11 g, Rfl: 0   Cholecalciferol (D-3-5) 125 MCG (5000 UT) capsule, Take 5,000 Units by mouth daily., Disp: , Rfl:    cyclobenzaprine (FLEXERIL) 5 MG tablet, Take 5 mg by mouth 3 (three) times daily as needed for muscle spasms., Disp: , Rfl:    escitalopram (LEXAPRO) 20 MG tablet, Take 20 mg by mouth daily., Disp: , Rfl:    ezetimibe  (ZETIA ) 10 MG tablet, TAKE 1 TABLET BY MOUTH ONCE DAILY IN THE EVENING, Disp: 90 tablet, Rfl: 3   gabapentin (NEURONTIN) 300 MG capsule, Take 300 mg by mouth every evening., Disp: , Rfl:    isosorbide  mononitrate (IMDUR ) 30 MG 24 hr tablet, Take 1 tablet (30 mg total) by mouth daily., Disp: 30 tablet, Rfl: 11   losartan  (COZAAR ) 100 MG tablet, Take 1 tablet by mouth in the evening, Disp: 90 tablet, Rfl: 2   meloxicam  (MOBIC ) 15 MG tablet, Take 1 tablet by mouth once daily, Disp: 30 tablet, Rfl: 1   nitroGLYCERIN  (NITROSTAT ) 0.4 MG SL tablet, Place 1 tablet (0.4 mg total) under the tongue every 5 (five) minutes as needed for up to 25 days for chest pain., Disp: 25 tablet, Rfl: 3   ondansetron  (ZOFRAN -ODT) 4 MG disintegrating tablet, Take 4 mg by mouth every 8 (eight) hours as needed for nausea or vomiting., Disp: , Rfl:    pantoprazole  (PROTONIX ) 20 MG tablet, Take 1 tablet (20 mg total) by mouth daily.,  Disp: 30 tablet, Rfl: 11   Probiotic Product (PROBIOTIC PO), Take 1 capsule by mouth daily., Disp: , Rfl:    propranolol  (INDERAL ) 20 MG tablet, TAKE 1 TABLET BY MOUTH THREE TIMES DAILY .TAKE  EXTRA  TABLET  IF  SBP>150 OR ANXIETY.,  Disp: 315 tablet, Rfl: 3   rosuvastatin  (CRESTOR ) 10 MG tablet, TAKE 1/2 (ONE-HALF) TABLET BY MOUTH AT BEDTIME, Disp: 45 tablet, Rfl: 2   Spacer/Aero-Holding Chambers (EQ SPACE CHAMBER ANTI-STATIC) DEVI, USE AS DIRECTED (Patient not taking: Reported on 07/09/2023), Disp: 1 each, Rfl: 0   topiramate  (TOPAMAX ) 25 MG capsule, Take 25 mg by mouth daily., Disp: , Rfl:    trospium (SANCTURA) 20 MG tablet, Take 20 mg by mouth 2 (two) times daily., Disp: , Rfl:   Vitals   Vitals:   2023-11-02 1021 2023-11-02 1024  BP:  106/66  Pulse: 87 (!) 40  Resp: 18 19  Temp: 98.2 F (36.8 C) 98.2 F (36.8 C)  SpO2: 100% 100%    There is no height or weight on file to calculate BMI.  Physical Exam   Constitutional: Appears well-developed and well-nourished.  Psych: Anxious and tearful at times Eyes: No scleral injection HENT: No oropharyngeal obstruction.  MSK: no joint deformities.  Cardiovascular: Sinus bradycardia on the monitor with rate from the 30s to the 40s.  Perfusing extremities well Respiratory: Effort normal, non-labored breathing GI: Soft.  No distension. There is no tenderness.  Skin: Warm dry and intact visible skin  Neurologic Examination   Mental Status: Patient is awake, alert, oriented to person, place, age, and situation.  Gets year right but month wrong (April) Patient is able to give a clear and coherent history. No signs of aphasia or neglect Cranial Nerves: II: Visual Fields are notable for inconsistent responses to stimuli on the right side. Pupils are equal, round, and reactive to light.   III,IV, VI: EOMI without ptosis or diploplia.  V: Facial sensation is diminished on the right VII: Facial movement is symmetric.  VIII: hearing is intact to  voice X: Phonation normal XII: tongue is midline without atrophy or fasciculations.  Motor: Tone is normal. Bulk is normal. Drift inconsistently noted in right upper extremity (drift during formal testing but later when keeping arms up but distracted by other maneuvers no drift), drift present in right lower extremity Sensory: Sensation is diminished on the right relative to the left with tingling in the fingers and toes on the right side (at times reports tingling throughout the right face arm and leg) Cerebellar: FNF and HKS are intact bilaterally Gait:  Deferred in acute setting    Labs/Imaging/Neurodiagnostic studies   CBC:  Recent Labs  Lab 02-Nov-2023 1030 2023-11-02 1038  WBC 7.0  --   NEUTROABS 4.8  --   HGB 11.3* 11.9*  HCT 34.9* 35.0*  MCV 91.6  --   PLT 305  --    Basic Metabolic Panel:  Lab Results  Component Value Date   NA 138 2023-11-02   K 4.6 November 02, 2023   CO2 29 05/28/2021   GLUCOSE 95 11-02-2023   BUN 13 2023/11/02   CREATININE 1.10 (H) 11/02/2023   CALCIUM  9.8 05/28/2021   GFRNONAA 111 11/09/2019   GFRAA 128 11/09/2019   Lipid Panel: No results found for: "LDLCALC"  HgbA1c: No results found for: "HGBA1C"   Urine Drug Screen:     Component Value Date/Time   LABOPIA NONE DETECTED 09/08/2016 1050   COCAINSCRNUR NONE DETECTED 09/08/2016 1050   LABBENZ NONE DETECTED 09/08/2016 1050   AMPHETMU NONE DETECTED 09/08/2016 1050   THCU NONE DETECTED 09/08/2016 1050   LABBARB NONE DETECTED 09/08/2016 1050    CT Head without contrast(Personally reviewed): no acute intracranial process   CT angio Head and Neck with contrast(Personally reviewed): No  LVO or hemodynamically significant stenosis  MRI Brain: Pending  ECHO  1. Left ventricular ejection fraction, by estimation, is 55 to 60%. The  left ventricle has normal function. The left ventricle has no regional  wall motion abnormalities. The left ventricular internal cavity size was  mildly dilated.  Left ventricular  diastolic parameters were normal.   2. Right ventricular systolic function is normal. The right ventricular  size is normal.   3. Left atrial size was severely dilated.   4. The mitral valve is normal in structure. Mild mitral valve  regurgitation. No evidence of mitral stenosis.   5. Tricuspid valve regurgitation is moderate.   6. The aortic valve is tricuspid. Aortic valve regurgitation is not  visualized. No aortic stenosis is present.   7. The inferior vena cava is dilated in size with >50% respiratory  variability, suggesting right atrial pressure of 8 mmHg.    ASSESSMENT   Mckenzie Park is a 48 y.o. female  with hx of migraines never with focal neurological symptoms, bradycardia, asthma, hypertension, depression, overactive bladder and sleep apnea who presents with sudden onset numbness of the right face, arm and leg as well as right upper extremity weakness and difficulty using her right hand.  Symptoms occurred suddenly at 930 while patient was walking around a store.  Patient also reported some recent palpitations and shortness of breath.  She felt that her current symptoms were disabling, and TNK was administered.  She was noted to be in sinus bradycardia on the monitor with heart rate from the high 30s to the high 40s.  Will hold home propranolol  and verapamil  in light of significant bradycardia with systolic blood pressure as low as the 90s.  500 cc normal saline bolus given due to soft blood pressure with improvement noted.  Patient does have a noted history of migraines but has not been followed by neurology for them for quite some time.  She reports that she currently has a dull headache on the right side but states that she has never had a complicated migraine in the past and that this headache does not feel like one of her typical migraines.  Will admit to ICU for post TNK monitoring and stroke workup.  Impression: Stroke/TIA versus complex migraine versus  functional neurologic disorder Treating as stroke determined by clinical assessment  RECOMMENDATIONS   # Complex migraine versus acute ischemic stroke, in the setting of severe dilation of the left atrium on echocardiogram, consider occult atrial fibrillation as an etiology - Stroke labs HgbA1c, fasting lipid panel - MRI brain 24 hours post TNK - Frequent neuro checks - Echocardiogram with EF 50 to 60%, mildly dilated left ventricle, severely dilated left atrium, moderate tricuspid valve regurgitation - Hold for 24 hours until post TNK head imaging completed  - Risk factor modification - Telemetry monitoring - Blood pressure goal   - Post TNK for 24  hours < 180/105 - PT consult, OT consult, Speech consult, unless patient is back to baseline - Stroke team to follow  # Bradycardia - Hold propanol and verapamil   - Monitor for rebound tachycardia with holding propanolol; ordered half home dose (10 mg) BID PRN for HR > 100 - 500 cc normal saline bolus for mild hypotension in the setting of bradycardia ______________________________________________________________________  Patient seen by NP with MD, MD to edit note as needed. Cortney E Bucky Cardinal , MSN, AGACNP-BC [facility] See Amion for schedule and pager information 10/17/2023 11:43 AM  Attending Neurologist's note:  I personally saw this patient, gathering history, performing a full neurologic examination, reviewing relevant labs, personally reviewing relevant imaging including Head CT, CTA head and neck, and formulated the assessment and plan, adding the note above for completeness and clarity to accurately reflect my thoughts   Signed, Baldwin Levee MD-PhD [facility] [phone]   CRITICAL CARE Performed by: Ronnette Coke   Total critical care time: 40 minutes  Critical care time was exclusive of separately billable procedures and treating other patients.  Critical care was necessary to  treat or prevent imminent or life-threatening deterioration.  Critical care was time spent personally by me on the following activities: development of treatment plan with patient and/or surrogate as well as nursing, discussions with consultants, evaluation of patient's response to treatment, examination of patient, obtaining history from patient or surrogate, ordering and performing treatments and interventions, ordering and review of laboratory studies, ordering and review of radiographic studies, pulse oximetry and re-evaluation of patient's condition.

## 2023-10-17 NOTE — Progress Notes (Signed)
 eLink Physician-Brief Progress Note Patient Name: Zanetta Dehaan DOB: 10/26/1975 MRN: [id number]   Date of Service  10/17/2023  HPI/Events of Note  48 y.o. female with hx of migraines never with focal neurological symptoms, bradycardia, asthma, hypertension, depression, overactive bladder and sleep apnea who presents with sudden onset right sided numbness and weakness of the right arm.  Found to have a stroke versus TIA versus complex migraine but she received TNK for symptoms and is referred to critical care for observation.   Patient is bradycardic but otherwise normal vitals, laboratory studies fairly unremarkable, imaging thus far unremarkable  eICU Interventions  TNK precautions for 24 hours, frequent neurological checks,  Neuro structural restratification labs in place  With bradycardia, appears to be sinus and asymptomatic so no intervention  DVT prophylaxis with SCDs in the setting of TNK GI prophylaxis not indicated, consider discontinuation of pantoprazole      Intervention Category Evaluation Type: New Patient Evaluation  Tayvon Culley 10/17/2023, 8:01 PM

## 2023-10-17 NOTE — ED Provider Notes (Signed)
 I provided a substantive portion of the care of this patient.  I personally made/approved the management plan for this patient and take responsibility for the patient management.  EKG Interpretation Date/Time:  Saturday Oct 17 2023 11:12:44 EDT Ventricular Rate:  38 PR Interval:  147 QRS Duration:  102 QT Interval:  512 QTC Calculation: 407 R Axis:   133  Text Interpretation: Sinus bradycardia Atrial premature complexes in couplets Probable lateral infarct, age indeterminate No significant change since last tracing Confirmed by Lind Repine ([id number]) on 10/17/2023 11:65:61 AM   48 year old female presents with right-sided facial numbness.  Patient called code stroke on arrival here.  Received TNK and will be admitted to ICU     Lind Repine, MD 10/17/23 1129

## 2023-10-17 NOTE — ED Provider Notes (Signed)
 [facility] Provider Note   CSN: [id number] Arrival date & time: 10/17/23  1016     History  Chief Complaint  Patient presents with   Code Stroke   Chest Pain    Mckenzie Park is a 48 y.o. female with a past medical history of migraine headaches, depression, hypertension and OSA who presents emergency department for evaluation of sudden onset paresthesia.  Patient was in her normal state of health walking around the grocery store with her husband around 9:30 AM when she had sudden onset of headache and right facial arm and leg paresthesia.  She states that she told her husband she did not "feel right."  He told her that her color was abnormal.  She began feeling extremely short of breath and lightheaded like she might pass out and was brought for further evaluation.  Patient is notably bradycardic states that she was seen in the emergency department 2 days ago and was seen for flank pain/pyelo-.  She reports that during her stay there it was in the 30s and has never been that low before.  She has no other neurologic complaints at this time including vision change difficulty with speech.  Patient seen as code stroke activation.   Chest Pain      Home Medications Prior to Admission medications   Medication Sig Start Date End Date Taking? Authorizing Provider  albuterol  (VENTOLIN  HFA) 108 (90 Base) MCG/ACT inhaler Inhale 2 puffs into the lungs every 6 (six) hours as needed for wheezing or shortness of breath. 04/28/23   Wilfredo Hanly, MD  ALPRAZolam (XANAX) 0.5 MG tablet Take 0.5 mg by mouth 3 (three) times daily.    [provider]  BREZTRI  AEROSPHERE 160-9-4.8 MCG/ACT AERO inhaler INHALE 2 PUFFS IN THE MORNING AND AT BEDTIME 10/15/23   Wilfredo Hanly, MD  Cholecalciferol (D-3-5) 125 MCG (5000 UT) capsule Take 5,000 Units by mouth daily.    [provider]  cyclobenzaprine (FLEXERIL) 5 MG tablet Take 5 mg by mouth 3  (three) times daily as needed for muscle spasms.    [provider]  escitalopram (LEXAPRO) 20 MG tablet Take 20 mg by mouth daily.    [provider]  ezetimibe  (ZETIA ) 10 MG tablet TAKE 1 TABLET BY MOUTH ONCE DAILY IN THE EVENING 06/12/23   Knox Perl, MD  gabapentin (NEURONTIN) 300 MG capsule Take 300 mg by mouth every evening.    [provider]  isosorbide  mononitrate (IMDUR ) 30 MG 24 hr tablet Take 1 tablet (30 mg total) by mouth daily. 07/09/23   Gerald Kitty., NP  losartan  (COZAAR ) 100 MG tablet Take 1 tablet by mouth in the evening 09/29/23   Knox Perl, MD  meloxicam  (MOBIC ) 15 MG tablet Take 1 tablet by mouth once daily 09/15/22   Sikora, Rebecca, DPM  nitroGLYCERIN  (NITROSTAT ) 0.4 MG SL tablet Place 1 tablet (0.4 mg total) under the tongue every 5 (five) minutes as needed for up to 25 days for chest pain. 12/09/19 06/12/23  Knox Perl, MD  ondansetron  (ZOFRAN -ODT) 4 MG disintegrating tablet Take 4 mg by mouth every 8 (eight) hours as needed for nausea or vomiting.    [provider]  pantoprazole  (PROTONIX ) 20 MG tablet Take 1 tablet (20 mg total) by mouth daily. 07/09/23   Gerald Kitty., NP  Probiotic Product (PROBIOTIC PO) Take 1 capsule by mouth daily.    [provider]  propranolol  (INDERAL ) 20 MG  tablet TAKE 1 TABLET BY MOUTH THREE TIMES DAILY .TAKE  EXTRA  TABLET  IF  SBP>150 OR ANXIETY. 06/12/23   Knox Perl, MD  rosuvastatin  (CRESTOR ) 10 MG tablet TAKE 1/2 (ONE-HALF) TABLET BY MOUTH AT BEDTIME 09/15/23   Knox Perl, MD  Spacer/Aero-Holding Chambers [facility] SPACE CHAMBER ANTI-STATIC) DEVI USE AS DIRECTED Patient not taking: Reported on 07/09/2023 12/01/22   Wilfredo Hanly, MD  topiramate  (TOPAMAX ) 25 MG capsule Take 25 mg by mouth daily. 01/28/22   [provider]  trospium (SANCTURA) 20 MG tablet Take 20 mg by mouth 2 (two) times daily.    [provider]      Allergies    Patient has no known allergies.    Review of  Systems   Review of Systems  Cardiovascular:  Positive for chest pain.    Physical Exam Updated Vital Signs BP 106/66 (BP Location: Right Arm)   Pulse (!) 40   Temp 98.2 F (36.8 C)   Resp 19   Wt 113.2 kg   SpO2 100%   BMI 40.28 kg/m  Physical Exam Vitals and nursing note reviewed.  Constitutional:      General: She is not in acute distress.    Appearance: She is well-developed. She is not diaphoretic.  HENT:     Head: Normocephalic and atraumatic.     Right Ear: External ear normal.     Left Ear: External ear normal.     Nose: Nose normal.     Mouth/Throat:     Mouth: Mucous membranes are moist.  Eyes:     General: No scleral icterus.    Conjunctiva/sclera: Conjunctivae normal.  Cardiovascular:     Rate and Rhythm: Normal rate and regular rhythm.     Heart sounds: Normal heart sounds. No murmur heard.    No friction rub. No gallop.  Pulmonary:     Effort: Pulmonary effort is normal. No respiratory distress.     Breath sounds: Normal breath sounds.  Abdominal:     General: Bowel sounds are normal. There is no distension.     Palpations: Abdomen is soft. There is no mass.     Tenderness: There is no abdominal tenderness. There is no guarding.  Musculoskeletal:     Cervical back: Normal range of motion.  Skin:    General: Skin is warm and dry.  Neurological:     Mental Status: She is alert and oriented to person, place, and time.     Comments: Please see full neurologic examination by Dr. Cleone Dad.  Patient with hyperesthesia of the right face arm and leg.  No noted drift.  Manger of exam appears to be normal  Psychiatric:        Behavior: Behavior normal.     ED Results / Procedures / Treatments   Labs (all labs ordered are listed, but only abnormal results are displayed) Labs Reviewed  CBC - Abnormal; Notable for the following components:      Result Value   RBC 3.81 (*)    Hemoglobin 11.3 (*)    HCT 34.9 (*)    All other components within normal limits   I-STAT CHEM 8, ED - Abnormal; Notable for the following components:   Creatinine, Ser 1.10 (*)    Hemoglobin 11.9 (*)    HCT 35.0 (*)    All other components within normal limits  CBG MONITORING, ED - Abnormal; Notable for the following components:   Glucose-Capillary 107 (*)    All other  components within normal limits  PROTIME-INR  APTT  DIFFERENTIAL  COMPREHENSIVE METABOLIC PANEL WITH GFR  ETHANOL    EKG None  Radiology CT HEAD CODE STROKE WO CONTRAST Result Date: 10/17/2023 EXAM: CT Head Without Intravenous Contrast. CLINICAL HISTORY: Neuro deficit, acute, stroke suspected. Non Con. Code Stroke. Right side facial numbness/weakness. TECHNIQUE: Axial computed tomography images of the head/brain without intravenous contrast. Dose reduction technique was used including one or more of the following: automated exposure control, adjustment of mA and kV according to patient size, and/or iterative reconstruction. COMPARISON: MR head without and with contrast 01/07/2020. FINDINGS: BRAIN: No acute intraparenchymal hemorrhage. No mass lesion. No CT evidence for acute territorial infarct. No midline shift or extra-axial collection. Aspects: Ganglionic level: 7/7 Supraganglionic level:  3/3 Total: 10/10 VENTRICLES: No hydrocephalus. ORBITS: The orbits are unremarkable. SINUSES AND MASTOIDS: The paranasal sinuses and mastoid air cells are clear. SOFT TISSUES: No significant facial or scalp soft tissue swelling evident. No radiopaque foreign body is seen. BONES: No acute skull fracture. IMPRESSION: 1. No acute intracranial abnormality. The pertinent results were texted to dr. Cleone Dad via the [facility] system at 10:41am. Electronically signed by: Audree Leas MD 10/17/2023 10:42 AM EDT RP Workstation: [id number]    Procedures .Critical Care  Performed by: Tama Fails, PA-C Authorized by: Tama Fails, PA-C   Critical care provider statement:    Critical care time (minutes):  30   Critical  care time was exclusive of:  Separately billable procedures and treating other patients   Critical care was necessary to treat or prevent imminent or life-threatening deterioration of the following conditions:  CNS failure or compromise   Critical care was time spent personally by me on the following activities:  Development of treatment plan with patient or surrogate, discussions with consultants, evaluation of patient's response to treatment, examination of patient, ordering and review of laboratory studies, ordering and review of radiographic studies, ordering and performing treatments and interventions, pulse oximetry, re-evaluation of patient's condition, review of old charts, interpretation of cardiac output measurements and obtaining history from patient or surrogate     Medications Ordered in ED Medications  sodium chloride  flush (NS) 0.9 % injection 3 mL (has no administration in time range)  iopamidol  (ISOVUE -370) 76 % injection 75 mL (has no administration in time range)  tenecteplase (TNKASE) injection for Stroke 25 mg (has no administration in time range)    ED Course/ Medical Decision Making/ A&P Clinical Course as of 10/17/23 1547  Sat Oct 17, 2023  1055 Patient evaluated in CT scanner with the stroke team and Dr. Cleone Dad.  Patient receiving TNK.  Initial CT head negative for acute stroke findings or bleed.  She will be receiving a CT angiogram. [AH]  1109 I-stat chem 8, ED(!) [AH]  1109 Comprehensive metabolic panel(!) [AH]  1110 CBG monitoring, ED(!) [AH]  1110 CBC(!) [AH]    Clinical Course User Index [AH] Tama Fails, PA-C                                 Medical Decision Making Amount and/or Complexity of Data Reviewed Labs: ordered. Decision-making details documented in ED Course. Radiology: ordered.  Risk Decision regarding hospitalization.   Patient here for eval after code stroke. High suspicion for complicated migraine v stroke. Given TNK by neurology  and will require admission and cardiac monitoring for her bradycardia        Final Clinical Impression(s) / ED Diagnoses  Final diagnoses:  None    Rx / DC Orders ED Discharge Orders     None         Tama Fails, PA-C 10/17/23 1549    Lind Repine, MD 10/18/23 [phone]

## 2023-10-18 ENCOUNTER — Inpatient Hospital Stay (HOSPITAL_COMMUNITY)

## 2023-10-18 DIAGNOSIS — I495 Sick sinus syndrome: Secondary | ICD-10-CM | POA: Diagnosis not present

## 2023-10-18 DIAGNOSIS — I959 Hypotension, unspecified: Secondary | ICD-10-CM | POA: Diagnosis not present

## 2023-10-18 DIAGNOSIS — R29818 Other symptoms and signs involving the nervous system: Secondary | ICD-10-CM | POA: Diagnosis not present

## 2023-10-18 DIAGNOSIS — R200 Anesthesia of skin: Secondary | ICD-10-CM

## 2023-10-18 DIAGNOSIS — I209 Angina pectoris, unspecified: Secondary | ICD-10-CM

## 2023-10-18 DIAGNOSIS — I10 Essential (primary) hypertension: Secondary | ICD-10-CM | POA: Diagnosis not present

## 2023-10-18 DIAGNOSIS — R531 Weakness: Secondary | ICD-10-CM

## 2023-10-18 DIAGNOSIS — E785 Hyperlipidemia, unspecified: Secondary | ICD-10-CM

## 2023-10-18 LAB — COMPREHENSIVE METABOLIC PANEL WITH GFR
ALT: 28 U/L (ref 0–44)
AST: 30 U/L (ref 15–41)
Albumin: 3.2 g/dL — ABNORMAL LOW (ref 3.5–5.0)
Alkaline Phosphatase: 68 U/L (ref 38–126)
Anion gap: 6 (ref 5–15)
BUN: 9 mg/dL (ref 6–20)
CO2: 25 mmol/L (ref 22–32)
Calcium: 8.5 mg/dL — ABNORMAL LOW (ref 8.9–10.3)
Chloride: 109 mmol/L (ref 98–111)
Creatinine, Ser: 0.91 mg/dL (ref 0.44–1.00)
GFR, Estimated: >60 mL/min (ref >60)
Glucose, Bld: 97 mg/dL (ref 70–99)
Potassium: 4.3 mmol/L (ref 3.5–5.1)
Sodium: 140 mmol/L (ref 135–145)
Total Bilirubin: 0.5 mg/dL (ref 0.0–1.2)
Total Protein: 6.2 g/dL — ABNORMAL LOW (ref 6.5–8.1)

## 2023-10-18 LAB — URINALYSIS, ROUTINE W REFLEX MICROSCOPIC
Bilirubin Urine: NEGATIVE
Glucose, UA: NEGATIVE mg/dL
Hgb urine dipstick: NEGATIVE
Ketones, ur: NEGATIVE mg/dL
Leukocytes,Ua: NEGATIVE
Nitrite: NEGATIVE
Protein, ur: NEGATIVE mg/dL
Specific Gravity, Urine: 1.006 (ref 1.005–1.030)
pH: 6 (ref 5.0–8.0)

## 2023-10-18 LAB — CK TOTAL AND CKMB (NOT AT ARMC)
CK, MB: 1.5 ng/mL (ref 0.5–5.0)
Total CK: 68 U/L (ref 38–234)

## 2023-10-18 LAB — TROPONIN I (HIGH SENSITIVITY)
Troponin I (High Sensitivity): 4 ng/L (ref <18)
Troponin I (High Sensitivity): 4 ng/L (ref <18)

## 2023-10-18 LAB — RAPID URINE DRUG SCREEN, HOSP PERFORMED
Amphetamines: NOT DETECTED
Barbiturates: NOT DETECTED
Benzodiazepines: POSITIVE — AB
Cocaine: NOT DETECTED
Opiates: NOT DETECTED
Tetrahydrocannabinol: NOT DETECTED

## 2023-10-18 LAB — CBC
HCT: 35.6 % — ABNORMAL LOW (ref 36.0–46.0)
Hemoglobin: 11.7 g/dL — ABNORMAL LOW (ref 12.0–15.0)
MCH: 29.4 pg (ref 26.0–34.0)
MCHC: 32.9 g/dL (ref 30.0–36.0)
MCV: 89.4 fL (ref 80.0–100.0)
Platelets: 256 10*3/uL (ref 150–400)
RBC: 3.98 MIL/uL (ref 3.87–5.11)
RDW: 14 % (ref 11.5–15.5)
WBC: 5.3 10*3/uL (ref 4.0–10.5)
nRBC: 0 % (ref 0.0–0.2)

## 2023-10-18 LAB — LIPID PANEL
Cholesterol: 101 mg/dL (ref 0–200)
HDL: 46 mg/dL (ref >40)
LDL Cholesterol: 45 mg/dL (ref 0–99)
Total CHOL/HDL Ratio: 2.2 ratio
Triglycerides: 52 mg/dL (ref <150)
VLDL: 10 mg/dL (ref 0–40)

## 2023-10-18 LAB — BRAIN NATRIURETIC PEPTIDE: B Natriuretic Peptide: 362.3 pg/mL — ABNORMAL HIGH (ref 0.0–100.0)

## 2023-10-18 LAB — HEMOGLOBIN A1C
Hgb A1c MFr Bld: 5.5 % (ref 4.8–5.6)
Mean Plasma Glucose: 111.15 mg/dL

## 2023-10-18 LAB — MAGNESIUM: Magnesium: 2.1 mg/dL (ref 1.7–2.4)

## 2023-10-18 LAB — HIV ANTIBODY (ROUTINE TESTING W REFLEX): HIV Screen 4th Generation wRfx: NONREACTIVE

## 2023-10-18 MED ORDER — ASPIRIN 81 MG PO TBEC
81.0000 mg | DELAYED_RELEASE_TABLET | Freq: Every day | ORAL | Status: DC
Start: 2023-10-18 — End: 2023-10-19
  Administered 2023-10-18 – 2023-10-19 (×2): 81 mg via ORAL
  Filled 2023-10-18 (×2): qty 1

## 2023-10-18 MED ORDER — LAMOTRIGINE 25 MG PO TABS
25.0000 mg | ORAL_TABLET | Freq: Two times a day (BID) | ORAL | Status: DC
  Administered 2023-10-18 – 2023-10-19 (×2): 25 mg via ORAL
  Filled 2023-10-18 (×2): qty 1

## 2023-10-18 MED ORDER — ENOXAPARIN SODIUM 60 MG/0.6ML IJ SOSY
55.0000 mg | PREFILLED_SYRINGE | INTRAMUSCULAR | Status: DC
  Administered 2023-10-18: 55 mg via SUBCUTANEOUS
  Filled 2023-10-18 (×2): qty 0.6

## 2023-10-18 NOTE — Progress Notes (Signed)
 STROKE TEAM PROGRESS NOTE    SIGNIFICANT HOSPITAL EVENTS  TNK given 5/24 1100 CT Head 24hour post shows no bleed.   INTERIM HISTORY/SUBJECTIVE  Patient describes presenting symptoms as: Acute onset chest tightness/pain, right face numb feeling like she was going to pass out, right face droop and color gone from face.  She does have a history of migraines (none for 5 years), but she denies headache during these presenting symptoms or currently.  Describes continued and possibly worsening chest pain, cardiac labs and eKG ordered--Cardio consulted.   On exam, endorses sensation decreased on right ide of face with tingling on right side of her lips.   Will transfer patient out of ICU, will need to remain on telemetry.    OBJECTIVE  CBC    Component Value Date/Time   WBC 5.3 10/18/2023 0508   RBC 3.98 10/18/2023 0508   HGB 11.7 (L) 10/18/2023 0508   HCT 35.6 (L) 10/18/2023 0508   PLT 256 10/18/2023 0508   MCV 89.4 10/18/2023 0508   MCH 29.4 10/18/2023 0508   MCHC 32.9 10/18/2023 0508   RDW 14.0 10/18/2023 0508   LYMPHSABS 1.5 10/17/2023 1030   MONOABS 0.5 10/17/2023 1030   EOSABS 0.2 10/17/2023 1030   BASOSABS 0.1 10/17/2023 1030    BMET    Component Value Date/Time   NA 140 10/18/2023 0508   NA 141 04/10/2021 0930   K 4.3 10/18/2023 0508   CL 109 10/18/2023 0508   CO2 25 10/18/2023 0508   GLUCOSE 97 10/18/2023 0508   BUN 9 10/18/2023 0508   BUN 9 04/10/2021 0930   CREATININE 0.91 10/18/2023 0508   CREATININE 0.56 05/28/2021 0909   CALCIUM  8.5 (L) 10/18/2023 0508   EGFR 114 05/28/2021 0909   EGFR 113 04/10/2021 0930   GFRNONAA >60 10/18/2023 0508    IMAGING past 24 hours CT HEAD WO CONTRAST ( ) Result Date: 10/17/2023 CLINICAL DATA:  headache post TNK EXAM: CT HEAD WITHOUT CONTRAST TECHNIQUE: Contiguous axial images were obtained from the base of the skull through the vertex without intravenous contrast. RADIATION DOSE REDUCTION: This exam was performed  according to the departmental dose-optimization program which includes automated exposure control, adjustment of the mA and/or kV according to patient size and/or use of iterative reconstruction technique. COMPARISON:  CT head 10/17/2023 10:42 a.m., MRI head 10/17/2023 FINDINGS: Brain: No evidence of large-territorial acute infarction. No parenchymal hemorrhage. No mass lesion. No extra-axial collection. No mass effect or midline shift. No hydrocephalus. Basilar cisterns are patent. Vascular: No hyperdense vessel. Skull: No acute fracture or focal lesion. Sinuses/Orbits: Paranasal sinuses and mastoid air cells are clear. The orbits are unremarkable. Other: None. IMPRESSION: No acute intracranial abnormality. Electronically Signed   By: Morgane  Naveau M.D.   On: 10/17/2023 19:09   ECHOCARDIOGRAM COMPLETE Result Date: 10/17/2023    ECHOCARDIOGRAM REPORT   Patient Name:   Kesi Perrow Date of Exam: 10/17/2023 Medical Rec #:  [id number]    Height:       66.0 in Accession #:    [id number]   Weight:       249.6 lb Date of Birth:  [birth year]   BSA:          2.197 m Patient Age:    48 years     BP:           119/73 mmHg Patient Gender: F            HR:  39 bpm. Exam Location:  Inpatient Procedure: 2D Echo, Cardiac Doppler and Color Doppler (Both Spectral and Color            Flow Doppler were utilized during procedure). Indications:    Stroke  History:        Patient has prior history of Echocardiogram examinations, most                 recent 08/25/2022.  Sonographer:    Andrena Bang Referring Phys: Leandro Proffer [place] IMPRESSIONS  1. Left ventricular ejection fraction, by estimation, is 55 to 60%. The left ventricle has normal function. The left ventricle has no regional wall motion abnormalities. The left ventricular internal cavity size was mildly dilated. Left ventricular diastolic parameters were normal.  2. Right ventricular systolic function is normal. The right ventricular size is normal.  3. Left atrial  size was severely dilated.  4. The mitral valve is normal in structure. Mild mitral valve regurgitation. No evidence of mitral stenosis.  5. Tricuspid valve regurgitation is moderate.  6. The aortic valve is tricuspid. Aortic valve regurgitation is not visualized. No aortic stenosis is present.  7. The inferior vena cava is dilated in size with >50% respiratory variability, suggesting right atrial pressure of 8 mmHg. Comparison(s): No prior Echocardiogram. FINDINGS  Left Ventricle: Left ventricular ejection fraction, by estimation, is 55 to 60%. The left ventricle has normal function. The left ventricle has no regional wall motion abnormalities. The left ventricular internal cavity size was mildly dilated. There is  no left ventricular hypertrophy. Left ventricular diastolic parameters were normal. Right Ventricle: The right ventricular size is normal. Right ventricular systolic function is normal. Left Atrium: Left atrial size was severely dilated. Right Atrium: Right atrial size was normal in size. Pericardium: There is no evidence of pericardial effusion. Mitral Valve: The mitral valve is normal in structure. Mild mitral valve regurgitation. No evidence of mitral valve stenosis. Tricuspid Valve: The tricuspid valve is normal in structure. Tricuspid valve regurgitation is moderate . No evidence of tricuspid stenosis. Aortic Valve: The aortic valve is tricuspid. Aortic valve regurgitation is not visualized. No aortic stenosis is present. Aortic valve mean gradient measures 4.0 mmHg. Aortic valve peak gradient measures 8.9 mmHg. Pulmonic Valve: The pulmonic valve was normal in structure. Pulmonic valve regurgitation is trivial. No evidence of pulmonic stenosis. Aorta: The aortic root is normal in size and structure. Venous: The inferior vena cava is dilated in size with greater than 50% respiratory variability, suggesting right atrial pressure of 8 mmHg. IAS/Shunts: No atrial level shunt detected by color flow  Doppler.  LEFT VENTRICLE PLAX 2D LVIDd:         5.50 cm      Diastology LVIDs:         3.60 cm      LV e' medial:    8.70 cm/s LV PW:         0.80 cm      LV E/e' medial:  9.3 LV IVS:        0.80 cm      LV e' lateral:   19.00 cm/s LVOT diam:     1.80 cm      LV E/e' lateral: 4.3 LV SV:         65 LV SV Index:   30 LVOT Area:     2.54 cm  LV Volumes (MOD) LV vol d, MOD A2C: 178.0 ml LV vol d, MOD A4C: 108.0 ml LV vol s,  MOD A2C: 41.8 ml LV vol s, MOD A4C: 40.1 ml LV SV MOD A2C:     136.2 ml LV SV MOD A4C:     108.0 ml LV SV MOD BP:      99.0 ml RIGHT VENTRICLE RV S prime:     13.70 cm/s TAPSE (M-mode): 2.4 cm LEFT ATRIUM              Index LA diam:        5.00 cm  2.28 cm/m LA Vol (A2C):   103.0 ml 46.88 ml/m LA Vol (A4C):   114.0 ml 51.89 ml/m LA Biplane Vol: 119.0 ml 54.16 ml/m  AORTIC VALVE AV Area (Vmax):    1.79 cm AV Area (Vmean):   1.65 cm AV Area (VTI):     1.79 cm AV Vmax:           149.00 cm/s AV Vmean:          96.400 cm/s AV VTI:            0.363 m AV Peak Grad:      8.9 mmHg AV Mean Grad:      4.0 mmHg LVOT Vmax:         105.00 cm/s LVOT Vmean:        62.500 cm/s LVOT VTI:          0.256 m LVOT/AV VTI ratio: 0.71  AORTA Ao Asc diam: 3.20 cm MITRAL VALVE MV Area (PHT): 1.76 cm    SHUNTS MV Decel Time: 431 msec    Systemic VTI:  0.26 m MV E velocity: 81.20 cm/s  Systemic Diam: 1.80 cm MV A velocity: 43.00 cm/s MV E/A ratio:  1.89 Alexandria Angel MD Electronically signed by Alexandria Angel MD Signature Date/Time: 10/17/2023/3:01:15 PM    Final    MR BRAIN WO CONTRAST Result Date: 10/17/2023 CLINICAL DATA:  Stroke follow-up EXAM: MRI HEAD WITHOUT CONTRAST TECHNIQUE: Multiplanar, multiecho pulse sequences of the brain and surrounding structures were obtained without intravenous contrast. COMPARISON:  Head CT and CTA from earlier today FINDINGS: Brain: No acute infarction, hemorrhage, hydrocephalus, extra-axial collection or mass lesion. Vascular: Normal flow voids. Skull and upper cervical spine:  Normal marrow signal. Sinuses/Orbits: T2 hyperintensity at the right petrous apex, circumscribed by CT, likely trapped fluid. No T1 or diffusion hyperintensity. Other: Subcutaneous susceptibility artifact at the high left scalp, likely chronic by CT. IMPRESSION: Normal appearance of the brain.  No infarct. Electronically Signed   By: Ronnette Coke M.D.   On: 10/17/2023 12:52    Vitals:   10/18/23 0700 10/18/23 0800 10/18/23 0900 10/18/23 1000  BP: 109/72 (!) 117/58 (!) 107/53 117/82  Pulse: (!) 38 (!) 38 (!) 54 (!) 45  Resp: 17 11 15 17   Temp:  (!) 97.2 F (36.2 C)    TempSrc:  Oral    SpO2: 94% 94% 91% 94%  Weight:      Height:         PHYSICAL EXAM General:  Alert, well-nourished, well-developed patient in no acute distress Psych:  Mood and affect appropriate for situation CV: Regular rate and rhythm on monitor Respiratory:  Regular, unlabored respirations on room air GI: Abdomen soft and nontender   NEURO:  Mental Status: AA&Ox3, patient is able to give clear and coherent history Speech/Language: speech is without dysarthria or aphasia.  Naming, repetition, fluency, and comprehension intact.  Cranial Nerves:  II: PERRL. Visual fields full.  III, IV, VI: EOMI. Eyelids elevate symmetrically.  V: Sensation  is intact to light touch and symmetrical to face.  VII: Face is symmetrical resting and smiling VIII: hearing intact to voice. IX, X: Palate elevates symmetrically. Phonation is normal.  UJ:[id number] shrug 5/5. XII: tongue is midline without fasciculations. Motor: 5/5 strength to all muscle groups tested.  Tone: is normal and bulk is normal Sensation-decreased to right side of face with noted tingling to right lips Coordination: FTN intact bilaterally, HKS: no ataxia in BLE.No drift.  Gait- deferred  Most Recent NIH: 1.    ASSESSMENT/PLAN  Mckenzie Park is a 48 y.o. female with hx of migraines never with focal neurological symptoms, bradycardia, asthma,  hypertension, depression, sleep apnea who presents with sudden onset right sided numbness and weakness of the right arm.  NIH on Admission: 6.She was treated as stroke determined by clinical symptoms and given TNK.   Stroke-like symptoms versus FND versus Symptomatic Hypotension/bradycardia versus anxiety versus Angina Pectoris Code Stroke CT head  No acute abnormality.  CTA head & neck: Negative MRI: Negative 24hour post-TNK CT Head: Negative  2D Echo: EF 55 to 60%, severely dilated left atria, mild MVR, moderate TVR Pending Cardiology consult Hx of Bradycardia (dwn to 30s on previous hospital admission) PTA medications: Imdur , Verapamil , Propanolol LDL 45 HgbA1c 5.5 VTE prophylaxis - SCDs, will start lovenox today No antithrombotic prior to admission, now on aspirin  81 mg daily Therapy recommendations:  Pending Disposition:  pending  Hx of Bradycardia States intermittent chest pain, relieved with ambulation Trop 4--4 BNP 362.3 CK 68, CK MB 1.5 EKG: Sinus Bradycardia Monitor has shown as low as 30s at night, usually high 40s/low 50s during the day.  Cardiology consulted Recommends to hold Propanolol  Angina Pectoris Patient continues to endorse States tingling in her right lips also She has seen Dr. Berry Bristol in the past, these same symptoms noted.  Cardiac cath 05/2023 showed normal coronaries with normal micro circular function and catheter induced LAD spasm and proximal LAD  Started on Imdur  30mg  daily at 06/2023 Cardiology OP visit, continued on Propanolol 20mg  TID for palpitations.   Anxiety Home Meds: Xanax 0.5mg  TID PRN, Propanolol 20mg  BID, Trazadone 50mg  at bedtime PRN, Lamictal 25mg  BID.  Propanalol held due to bradycardia, Trazadone held. Lamictal restarted. Patient continues to endorse stress  Hx of Hypotension Home meds:  losartan  100mg  Propanolol prescribed for palpitations and anxiety, HELD Stable Blood Pressure Goal: BP less than 180/105    Hyperlipidemia Home meds:  Crestor  10mg , Zetia  10mg   LDL 45, goal < 70 High intensity statin not indicated due to LDL within goal on current treatment Continue statin at discharge  Other Stroke Risk Factors Obesity, Body mass index is 41.13 kg/m., BMI >/= 30 associated with increased stroke risk, recommend weight loss, diet and exercise as appropriate  Family hx stroke (uncle) Migraines   Hospital day # 1   Pt seen by Neuro NP/APP with MD. Note/plan to be edited by MD as needed.    Audrene Lease, DNP, AGACNP-BC [facility] Please use AMION for contact information & EPIC for messaging.    To contact Stroke Continuity provider, please refer to [email or web address]. After hours, contact General Neurology

## 2023-10-18 NOTE — Progress Notes (Signed)
 PHARMACY - ANTICOAGULATION CONSULT NOTE  Pharmacy Consult for Lovenox Indication: VTE prophylaxis  No Known Allergies  Patient Measurements: Height: 5\' 5"  (165.1 cm) Weight: 112.1 kg (247 lb 2.2 oz) IBW/kg (Calculated) : 57 HEPARIN  DW (KG): 83.5  Vital Signs: Temp: 97.5 F (36.4 C) (05/25 1200) Temp Source: Oral (05/25 1200) BP: 113/55 (05/25 1400) Pulse Rate: 44 (05/25 1400)  Labs: Recent Labs    10/17/23 1030 10/17/23 1038 10/18/23 0508 10/18/23 1047 10/18/23 1140  HGB 11.3* 11.9* 11.7*  --   --   HCT 34.9* 35.0* 35.6*  --   --   PLT 305  --  256  --   --   APTT 31  --   --   --   --   LABPROT 13.5  --   --   --   --   INR 1.0  --   --   --   --   CREATININE 1.01* 1.10* 0.91  --   --   CKTOTAL  --   --   --  68  --   CKMB  --   --   --  1.5  --   TROPONINIHS  --   --   --  4 4    Estimated Creatinine Clearance: 94.3 mL/min (by C-G formula based on SCr of 0.91 mg/dL).   Medical History: Past Medical History:  Diagnosis Date   Anxiety    Depression    GERD (gastroesophageal reflux disease)    Heart murmur    irregular heart beat is on medication   Hyperlipidemia    Hypertension    Kidney stone     Medications:  Medications Prior to Admission  Medication Sig Dispense Refill Last Dose/Taking   albuterol  (VENTOLIN  HFA) 108 (90 Base) MCG/ACT inhaler Inhale 2 puffs into the lungs every 6 (six) hours as needed for wheezing or shortness of breath. 8 g 11 Unknown   ALPRAZolam (XANAX) 0.5 MG tablet Take 0.5 mg by mouth 2 (two) times daily.   10/17/2023   BREZTRI  AEROSPHERE 160-9-4.8 MCG/ACT AERO inhaler INHALE 2 PUFFS IN THE MORNING AND AT BEDTIME (Patient taking differently: Inhale 2 puffs into the lungs in the morning and at bedtime.) 11 g 0 10/17/2023   Cholecalciferol (D-3-5) 125 MCG (5000 UT) capsule Take 5,000 Units by mouth daily.   10/17/2023   cyclobenzaprine (FLEXERIL) 5 MG tablet Take 5 mg by mouth 3 (three) times daily as needed for muscle spasms.   Past  Month   escitalopram (LEXAPRO) 20 MG tablet Take 20 mg by mouth daily.   10/17/2023   ezetimibe  (ZETIA ) 10 MG tablet TAKE 1 TABLET BY MOUTH ONCE DAILY IN THE EVENING 90 tablet 3 10/17/2023   HYDROcodone-acetaminophen  (NORCO/VICODIN) 5-325 MG tablet Take 1-2 tablets by mouth every 6 (six) hours as needed for moderate pain (pain score 4-6) or severe pain (pain score 7-10).   Unknown   isosorbide  mononitrate (IMDUR ) 30 MG 24 hr tablet Take 1 tablet (30 mg total) by mouth daily. 30 tablet 11 10/17/2023   lamoTRIgine (LAMICTAL) 25 MG tablet Take 25 mg by mouth 2 (two) times daily.   10/17/2023   losartan  (COZAAR ) 100 MG tablet Take 1 tablet by mouth in the evening 90 tablet 2 10/17/2023   Lurasidone HCl 60 MG TABS Take 60 mg by mouth at bedtime.   10/17/2023   nitroGLYCERIN  (NITROSTAT ) 0.4 MG SL tablet Place 1 tablet (0.4 mg total) under the tongue every 5 (five) minutes as  needed for up to 25 days for chest pain. 25 tablet 3 Past Week   ondansetron  (ZOFRAN -ODT) 4 MG disintegrating tablet Take 4 mg by mouth every 8 (eight) hours as needed for nausea or vomiting.   Past Week   propranolol  (INDERAL ) 20 MG tablet TAKE 1 TABLET BY MOUTH THREE TIMES DAILY .TAKE  EXTRA  TABLET  IF  SBP>150 OR ANXIETY. (Patient taking differently: Take 20 mg by mouth 3 (three) times daily.) 315 tablet 3 10/17/2023   rosuvastatin  (CRESTOR ) 10 MG tablet TAKE 1/2 (ONE-HALF) TABLET BY MOUTH AT BEDTIME (Patient taking differently: Take 10 mg by mouth at bedtime.) 45 tablet 2 10/17/2023   sulfamethoxazole -trimethoprim  (BACTRIM  DS) 800-160 MG tablet Take 1 tablet by mouth 2 (two) times daily.   10/17/2023   topiramate  (TOPAMAX ) 25 MG capsule Take 25 mg by mouth daily.   10/17/2023   traZODone (DESYREL) 50 MG tablet Take 1-2 tablets by mouth at bedtime as needed for sleep.   Past Week   verapamil  (CALAN -SR) 180 MG CR tablet Take 180 mg by mouth at bedtime.   10/17/2023    Assessment: 48 y.o. F presented as code stroke - given TNK 5/24 1100.  Pharmacy now consulted to dose Lovenox for VTE prophylaxis. Pt with BMI >30. CBC stable.  Goal of Therapy:  Prevention of VTE Monitor platelets by anticoagulation protocol: Yes   Plan:  Lovenox 55mg  SQ q24h Pharmacy will sign off but continue to follow daily while in ICU. Please reconsult if needed.  Enrigue Harvard, PharmD, BCPS Please see amion for complete clinical pharmacist phone list 10/18/2023,3:02 PM

## 2023-10-18 NOTE — Consult Note (Signed)
 Cardiology Consultation   Patient ID: Mckenzie Park MRN: [id number]; DOB: Feb 05, 1976  Admit date: 10/17/2023 Date of Consult: 10/18/2023  PCP:  Barnetta Liberty, MD   [facility] Providers Cardiologist:  Knox Perl, MD        Patient Profile:   Mckenzie Park is a 48 y.o. female with a hx of HA's and depression and HTN who is being seen 10/18/2023 for the evaluation of sinus bradycardia at the request of Dr. Rito Chess.  History of Present Illness:   Mckenzie Park is followed by Dr. Berry Bristol for HTN, obesity and a strong family h/o aortic dissection and CAD. She was in her usual state of health when she developed a multitude of neuro symptoms and was found to have stroke vs complex migraines and treated with TPA. She was noted to have sinus brady into the 30's but mostly in the mid and high 40's and is referred for eval. She admits to taking Propranolol  in the past. No h/o syncope.    Past Medical History:  Diagnosis Date   Anxiety    Depression    GERD (gastroesophageal reflux disease)    Heart murmur    irregular heart beat is on medication   Hyperlipidemia    Hypertension    Kidney stone     Past Surgical History:  Procedure Laterality Date   ABDOMINAL HYSTERECTOMY     CHOLECYSTECTOMY     CORONARY PRESSURE/FFR STUDY N/A 06/12/2023   Procedure: CORONARY PRESSURE/FFR STUDY;  Surgeon: Knox Perl, MD;  Location: [facility];  Service: Cardiovascular;  Laterality: N/A;   kidney stone removal     KNEE ARTHROSCOPY Bilateral    LEFT HEART CATH AND CORONARY ANGIOGRAPHY N/A 06/12/2023   Procedure: LEFT HEART CATH AND CORONARY ANGIOGRAPHY;  Surgeon: Knox Perl, MD;  Location: [facility];  Service: Cardiovascular;  Laterality: N/A;   TUBAL LIGATION       Home Medications:  Prior to Admission medications   Medication Sig Start Date End Date Taking? Authorizing Provider  albuterol  (VENTOLIN  HFA) 108 (90 Base) MCG/ACT inhaler Inhale 2 puffs into the lungs every 6 (six)  hours as needed for wheezing or shortness of breath. 04/28/23   Wilfredo Hanly, MD  ALPRAZolam (XANAX) 0.5 MG tablet Take 0.5 mg by mouth 3 (three) times daily.    [provider]  BREZTRI  AEROSPHERE 160-9-4.8 MCG/ACT AERO inhaler INHALE 2 PUFFS IN THE MORNING AND AT BEDTIME 10/15/23   Wilfredo Hanly, MD  Cholecalciferol (D-3-5) 125 MCG (5000 UT) capsule Take 5,000 Units by mouth daily.    [provider]  cyclobenzaprine (FLEXERIL) 5 MG tablet Take 5 mg by mouth 3 (three) times daily as needed for muscle spasms.    [provider]  escitalopram (LEXAPRO) 20 MG tablet Take 20 mg by mouth daily.    [provider]  ezetimibe  (ZETIA ) 10 MG tablet TAKE 1 TABLET BY MOUTH ONCE DAILY IN THE EVENING 06/12/23   Knox Perl, MD  isosorbide  mononitrate (IMDUR ) 30 MG 24 hr tablet Take 1 tablet (30 mg total) by mouth daily. 07/09/23   Gerald Kitty., NP  losartan  (COZAAR ) 100 MG tablet Take 1 tablet by mouth in the evening 09/29/23   Knox Perl, MD  meloxicam  (MOBIC ) 15 MG tablet Take 1 tablet by mouth once daily 09/15/22   Sikora, Rebecca, DPM  nitroGLYCERIN  (NITROSTAT ) 0.4 MG SL tablet Place 1 tablet (0.4 mg total) under the tongue every 5 (five) minutes as  needed for up to 25 days for chest pain. 12/09/19 10/17/23  Knox Perl, MD  ondansetron  (ZOFRAN -ODT) 4 MG disintegrating tablet Take 4 mg by mouth every 8 (eight) hours as needed for nausea or vomiting.    [provider]  propranolol  (INDERAL ) 20 MG tablet TAKE 1 TABLET BY MOUTH THREE TIMES DAILY .TAKE  EXTRA  TABLET  IF  SBP>150 OR ANXIETY. 06/12/23   Knox Perl, MD  rosuvastatin  (CRESTOR ) 10 MG tablet TAKE 1/2 (ONE-HALF) TABLET BY MOUTH AT BEDTIME 09/15/23   Knox Perl, MD  topiramate  (TOPAMAX ) 25 MG capsule Take 25 mg by mouth daily. 01/28/22   [provider]    Inpatient Medications: Scheduled Meds:  budesonide-glycopyrrolate-formoterol  2 puff Inhalation BID   Chlorhexidine Gluconate Cloth  6  each Topical Daily   cholecalciferol  5,000 Units Oral Daily   escitalopram  20 mg Oral Daily   ezetimibe   10 mg Oral QPM   pantoprazole  (PROTONIX ) IV  40 mg Intravenous QHS   rosuvastatin   5 mg Oral Daily   sodium chloride  flush  3 mL Intravenous Once   sulfamethoxazole -trimethoprim   1 tablet Oral Q12H   Continuous Infusions:  PRN Meds: acetaminophen  **OR** acetaminophen  (TYLENOL ) oral liquid 160 mg/5 mL **OR** acetaminophen , albuterol , ALPRAZolam, ondansetron , mouth rinse, senna-docusate  Allergies:   No Known Allergies  Social History:   Social History   Socioeconomic History   Marital status: Married    Spouse name: Not on file   Number of children: 4   Years of education: Not on file   Highest education level: Not on file  Occupational History   Not on file  Tobacco Use   Smoking status: Never    Passive exposure: Past   Smokeless tobacco: Never  Vaping Use   Vaping status: Never Used  Substance and Sexual Activity   Alcohol  use: Yes    Comment: occasionally   Drug use: No   Sexual activity: Not on file  Other Topics Concern   Not on file  Social History Narrative   Not on file   Social Drivers of Health   Financial Resource Strain: Low Risk  (12/20/2021)   [facility]   Overall Financial Resource Strain (CARDIA)    Difficulty of Paying Living Expenses: Not very hard  Food Insecurity: No Food Insecurity (10/17/2023)   Hunger Vital Sign    Worried About Running Out of Food in the Last Year: Never true    Ran Out of Food in the Last Year: Never true  Transportation Needs: No Transportation Needs (10/17/2023)   PRAPARE - Administrator, Civil Service (Medical): No    Lack of Transportation (Non-Medical): No  Physical Activity: Inactive (12/20/2021)   [facility]   Exercise Vital Sign    Days of Exercise per Week: 0 days    Minutes of Exercise per Session: 0  min  Stress: Stress Concern Present (06/24/2022)   Received from [facility], [facility] - Occupational Stress Questionnaire    Feeling of Stress : Very much  Social Connections: Unknown (12/22/2022)   Received from [facility]   Social Network    Social Network: Not on file  Intimate Partner Violence: Not At Risk (10/17/2023)   Humiliation, Afraid, Rape, and Kick questionnaire    Fear of Current or Ex-Partner: No    Emotionally Abused: No    Physically Abused:  No    Sexually Abused: No    Family History:    Family History  Problem Relation Age of Onset   Hypertension Mother    Heart disease Mother    COPD Mother    Parkinsonism Father    Hypertension Brother    AAA (abdominal aortic aneurysm) Brother 40   Heart attack Maternal Uncle 66   Esophageal cancer Paternal Uncle    Asthma Son    Colon cancer Neg Hx    Colon polyps Neg Hx    Rectal cancer Neg Hx    Stomach cancer Neg Hx      ROS:  Please see the history of present illness.   All other ROS reviewed and negative.     Physical Exam/Data:   Vitals:   10/18/23 1000 10/18/23 1100 10/18/23 1200 10/18/23 1300  BP: 117/82 (!) 126/53 102/64 119/74  Pulse: (!) 45 (!) 40 (!) 44 (!) 52  Resp: 17 18 16 15   Temp:   (!) 97.5 F (36.4 C)   TempSrc:   Oral   SpO2: 94% 95% 97% 93%  Weight:      Height:        Intake/Output Summary (Last 24 hours) at 10/18/2023 1356 Last data filed at 10/18/2023 1000 Gross per 24 hour  Intake 796.64 ml  Output 900 ml  Net -103.36 ml      10/17/2023    7:00 PM 10/17/2023   10:50 AM 07/09/2023   11:00 AM  Last 3 Weights  Weight (lbs) 247 lb 2.2 oz 249 lb 9 oz 227 lb 6.4 oz  Weight (kg) 112.1 kg 113.2 kg 103.148 kg     Body mass index is 41.13 kg/m.  General:  Well nourished, well developed, in no acute distress HEENT: normal Neck: no JVD Vascular: No carotid bruits; Distal pulses 2+ bilaterally Cardiac:  normal S1, S2; RRR; no  murmur  Lungs:  clear to auscultation bilaterally, no wheezing, rhonchi or rales  Abd: soft, nontender, no hepatomegaly  Ext: no edema Musculoskeletal:  No deformities, BUE and BLE strength normal and equal Skin: warm and dry  Neuro:  CNs 2-12 intact, no focal abnormalities noted Psych:  Normal affect   EKG:  The EKG was personally reviewed and demonstrates:  sinus brady Telemetry:  Telemetry was personally reviewed and demonstrates:  sinus brady  Relevant CV Studies: EF is normal.   Laboratory Data:  High Sensitivity Troponin:   Recent Labs  Lab 10/18/23 1047 10/18/23 1140  TROPONINIHS 4 4     Chemistry Recent Labs  Lab 10/17/23 1030 10/17/23 1038 10/18/23 0508  NA 136 138 140  K 4.6 4.6 4.3  CL 106 106 109  CO2 24  --  25  GLUCOSE 101* 95 97  BUN 12 13 9   CREATININE 1.01* 1.10* 0.91  CALCIUM  8.9  --  8.5*  GFRNONAA >60  --  >60  ANIONGAP 6  --  6    Recent Labs  Lab 10/17/23 1030 10/18/23 0508  PROT 6.3* 6.2*  ALBUMIN 3.3* 3.2*  AST 46* 30  ALT 30 28  ALKPHOS 78 68  BILITOT 0.2 0.5   Lipids  Recent Labs  Lab 10/18/23 0508  CHOL 101  TRIG 52  HDL 46  LDLCALC 45  CHOLHDL 2.2    Hematology Recent Labs  Lab 10/17/23 1030 10/17/23 1038 10/18/23 0508  WBC 7.0  --  5.3  RBC 3.81*  --  3.98  HGB 11.3* 11.9* 11.7*  HCT 34.9*  35.0* 35.6*  MCV 91.6  --  89.4  MCH 29.7  --  29.4  MCHC 32.4  --  32.9  RDW 14.1  --  14.0  PLT 305  --  256   Thyroid No results for input(s): "TSH", "FREET4" in the last 168 hours.  BNP Recent Labs  Lab 10/18/23 1048  BNP 362.3*    DDimer No results for input(s): "DDIMER" in the last 168 hours.   Radiology/Studies:  CT HEAD WO CONTRAST ( ) Result Date: 10/17/2023 CLINICAL DATA:  headache post TNK EXAM: CT HEAD WITHOUT CONTRAST TECHNIQUE: Contiguous axial images were obtained from the base of the skull through the vertex without intravenous contrast. RADIATION DOSE REDUCTION: This exam was performed according  to the departmental dose-optimization program which includes automated exposure control, adjustment of the mA and/or kV according to patient size and/or use of iterative reconstruction technique. COMPARISON:  CT head 10/17/2023 10:42 a.m., MRI head 10/17/2023 FINDINGS: Brain: No evidence of large-territorial acute infarction. No parenchymal hemorrhage. No mass lesion. No extra-axial collection. No mass effect or midline shift. No hydrocephalus. Basilar cisterns are patent. Vascular: No hyperdense vessel. Skull: No acute fracture or focal lesion. Sinuses/Orbits: Paranasal sinuses and mastoid air cells are clear. The orbits are unremarkable. Other: None. IMPRESSION: No acute intracranial abnormality. Electronically Signed   By: Morgane  Naveau M.D.   On: 10/17/2023 19:09   ECHOCARDIOGRAM COMPLETE Result Date: 10/17/2023    ECHOCARDIOGRAM REPORT   Patient Name:   Mckenzie Park Date of Exam: 10/17/2023 Medical Rec #:  [id number]    Height:       66.0 in Accession #:    [id number]   Weight:       249.6 lb Date of Birth:  [birth year]   BSA:          2.197 m Patient Age:    47 years     BP:           119/73 mmHg Patient Gender: F            HR:           39 bpm. Exam Location:  Inpatient Procedure: 2D Echo, Cardiac Doppler and Color Doppler (Both Spectral and Color            Flow Doppler were utilized during procedure). Indications:    Stroke  History:        Patient has prior history of Echocardiogram examinations, most                 recent 08/25/2022.  Sonographer:    Andrena Bang Referring Phys: Leandro Proffer [place] IMPRESSIONS  1. Left ventricular ejection fraction, by estimation, is 55 to 60%. The left ventricle has normal function. The left ventricle has no regional wall motion abnormalities. The left ventricular internal cavity size was mildly dilated. Left ventricular diastolic parameters were normal.  2. Right ventricular systolic function is normal. The right ventricular size is normal.  3. Left atrial size was  severely dilated.  4. The mitral valve is normal in structure. Mild mitral valve regurgitation. No evidence of mitral stenosis.  5. Tricuspid valve regurgitation is moderate.  6. The aortic valve is tricuspid. Aortic valve regurgitation is not visualized. No aortic stenosis is present.  7. The inferior vena cava is dilated in size with >50% respiratory variability, suggesting right atrial pressure of 8 mmHg. Comparison(s): No prior Echocardiogram. FINDINGS  Left Ventricle: Left ventricular ejection fraction, by estimation, is 55 to 60%. The  left ventricle has normal function. The left ventricle has no regional wall motion abnormalities. The left ventricular internal cavity size was mildly dilated. There is  no left ventricular hypertrophy. Left ventricular diastolic parameters were normal. Right Ventricle: The right ventricular size is normal. Right ventricular systolic function is normal. Left Atrium: Left atrial size was severely dilated. Right Atrium: Right atrial size was normal in size. Pericardium: There is no evidence of pericardial effusion. Mitral Valve: The mitral valve is normal in structure. Mild mitral valve regurgitation. No evidence of mitral valve stenosis. Tricuspid Valve: The tricuspid valve is normal in structure. Tricuspid valve regurgitation is moderate . No evidence of tricuspid stenosis. Aortic Valve: The aortic valve is tricuspid. Aortic valve regurgitation is not visualized. No aortic stenosis is present. Aortic valve mean gradient measures 4.0 mmHg. Aortic valve peak gradient measures 8.9 mmHg. Pulmonic Valve: The pulmonic valve was normal in structure. Pulmonic valve regurgitation is trivial. No evidence of pulmonic stenosis. Aorta: The aortic root is normal in size and structure. Venous: The inferior vena cava is dilated in size with greater than 50% respiratory variability, suggesting right atrial pressure of 8 mmHg. IAS/Shunts: No atrial level shunt detected by color flow Doppler.  LEFT  VENTRICLE PLAX 2D LVIDd:         5.50 cm      Diastology LVIDs:         3.60 cm      LV e' medial:    8.70 cm/s LV PW:         0.80 cm      LV E/e' medial:  9.3 LV IVS:        0.80 cm      LV e' lateral:   19.00 cm/s LVOT diam:     1.80 cm      LV E/e' lateral: 4.3 LV SV:         65 LV SV Index:   30 LVOT Area:     2.54 cm  LV Volumes (MOD) LV vol d, MOD A2C: 178.0 ml LV vol d, MOD A4C: 108.0 ml LV vol s, MOD A2C: 41.8 ml LV vol s, MOD A4C: 40.1 ml LV SV MOD A2C:     136.2 ml LV SV MOD A4C:     108.0 ml LV SV MOD BP:      99.0 ml RIGHT VENTRICLE RV S prime:     13.70 cm/s TAPSE (M-mode): 2.4 cm LEFT ATRIUM              Index LA diam:        5.00 cm  2.28 cm/m LA Vol (A2C):   103.0 ml 46.88 ml/m LA Vol (A4C):   114.0 ml 51.89 ml/m LA Biplane Vol: 119.0 ml 54.16 ml/m  AORTIC VALVE AV Area (Vmax):    1.79 cm AV Area (Vmean):   1.65 cm AV Area (VTI):     1.79 cm AV Vmax:           149.00 cm/s AV Vmean:          96.400 cm/s AV VTI:            0.363 m AV Peak Grad:      8.9 mmHg AV Mean Grad:      4.0 mmHg LVOT Vmax:         105.00 cm/s LVOT Vmean:        62.500 cm/s LVOT VTI:          0.256 m LVOT/AV  VTI ratio: 0.71  AORTA Ao Asc diam: 3.20 cm MITRAL VALVE MV Area (PHT): 1.76 cm    SHUNTS MV Decel Time: 431 msec    Systemic VTI:  0.26 m MV E velocity: 81.20 cm/s  Systemic Diam: 1.80 cm MV A velocity: 43.00 cm/s MV E/A ratio:  1.89 Alexandria Angel MD Electronically signed by Alexandria Angel MD Signature Date/Time: 10/17/2023/3:01:15 PM    Final    MR BRAIN WO CONTRAST Result Date: 10/17/2023 CLINICAL DATA:  Stroke follow-up EXAM: MRI HEAD WITHOUT CONTRAST TECHNIQUE: Multiplanar, multiecho pulse sequences of the brain and surrounding structures were obtained without intravenous contrast. COMPARISON:  Head CT and CTA from earlier today FINDINGS: Brain: No acute infarction, hemorrhage, hydrocephalus, extra-axial collection or mass lesion. Vascular: Normal flow voids. Skull and upper cervical spine: Normal marrow  signal. Sinuses/Orbits: T2 hyperintensity at the right petrous apex, circumscribed by CT, likely trapped fluid. No T1 or diffusion hyperintensity. Other: Subcutaneous susceptibility artifact at the high left scalp, likely chronic by CT. IMPRESSION: Normal appearance of the brain.  No infarct. Electronically Signed   By: Ronnette Coke M.D.   On: 10/17/2023 12:52   CT ANGIO HEAD NECK W WO CM Result Date: 10/17/2023 CLINICAL DATA:  Right-sided facial numbness/weakness. EXAM: CT ANGIOGRAPHY HEAD AND NECK WITH AND WITHOUT CONTRAST TECHNIQUE: Multidetector CT imaging of the head and neck was performed using the standard protocol during bolus administration of intravenous contrast. Multiplanar CT image reconstructions and MIPs were obtained to evaluate the vascular anatomy. Carotid stenosis measurements (when applicable) are obtained utilizing NASCET criteria, using the distal internal carotid diameter as the denominator. RADIATION DOSE REDUCTION: This exam was performed according to the departmental dose-optimization program which includes automated exposure control, adjustment of the mA and/or kV according to patient size and/or use of iterative reconstruction technique. CONTRAST:  75mL ISOVUE -370 IOPAMIDOL  (ISOVUE -370) INJECTION 76% COMPARISON:  Head CT earlier today FINDINGS: CTA NECK FINDINGS Aortic arch: Unremarkable Right carotid system: Vessels are smoothly contoured and diffusely patent. No atheromatous change. Left carotid system: Vessels are smoothly contoured and diffusely patent. No atheromatous change. Vertebral arteries: Vessels are smoothly contoured and diffusely patent. No atheromatous change. Skeleton: Unremarkable Other neck: Negative. Upper chest: No acute finding Review of the MIP images confirms the above findings CTA HEAD FINDINGS Anterior circulation: No significant stenosis, proximal occlusion, aneurysm, or vascular malformation. Posterior circulation: No significant stenosis, proximal  occlusion, aneurysm, or vascular malformation. Venous sinuses: Unremarkable for arterial timing Anatomic variants: None significant Review of the MIP images confirms the above findings IMPRESSION: Negative CTA.  No explanation for symptoms. Electronically Signed   By: Ronnette Coke M.D.   On: 10/17/2023 11:24   CT HEAD CODE STROKE WO CONTRAST Result Date: 10/17/2023 EXAM: CT Head Without Intravenous Contrast. CLINICAL HISTORY: Neuro deficit, acute, stroke suspected. Non Con. Code Stroke. Right side facial numbness/weakness. TECHNIQUE: Axial computed tomography images of the head/brain without intravenous contrast. Dose reduction technique was used including one or more of the following: automated exposure control, adjustment of mA and kV according to patient size, and/or iterative reconstruction. COMPARISON: MR head without and with contrast 01/07/2020. FINDINGS: BRAIN: No acute intraparenchymal hemorrhage. No mass lesion. No CT evidence for acute territorial infarct. No midline shift or extra-axial collection. Aspects: Ganglionic level: 7/7 Supraganglionic level:  3/3 Total: 10/10 VENTRICLES: No hydrocephalus. ORBITS: The orbits are unremarkable. SINUSES AND MASTOIDS: The paranasal sinuses and mastoid air cells are clear. SOFT TISSUES: No significant facial or scalp soft tissue swelling evident. No radiopaque foreign  body is seen. BONES: No acute skull fracture. IMPRESSION: 1. No acute intracranial abnormality. The pertinent results were texted to dr. Cleone Dad via the [facility] system at 10:41am. Electronically signed by: Audree Leas MD 10/17/2023 10:42 AM EDT RP Workstation: [id number]     Assessment and Plan:   Sinus node dysfunction - as she is asymptomatic, no additional treatment other than holding propranolol . Keep electrolytes replete. Obesity - she will be encouraged to lose weight.  HTN - her bp is controlled. Follow off propranolol .   Risk Assessment/Risk Scores:         For  questions or updates, please contact [facility] Please consult [email or web address] for contact info under    Signed, Manya Sells, MD  10/18/2023 1:56 PM

## 2023-10-19 DIAGNOSIS — R29818 Other symptoms and signs involving the nervous system: Secondary | ICD-10-CM | POA: Diagnosis not present

## 2023-10-19 DIAGNOSIS — I959 Hypotension, unspecified: Secondary | ICD-10-CM | POA: Diagnosis not present

## 2023-10-19 DIAGNOSIS — F419 Anxiety disorder, unspecified: Secondary | ICD-10-CM

## 2023-10-19 DIAGNOSIS — R001 Bradycardia, unspecified: Secondary | ICD-10-CM | POA: Diagnosis not present

## 2023-10-19 DIAGNOSIS — G4733 Obstructive sleep apnea (adult) (pediatric): Secondary | ICD-10-CM

## 2023-10-19 DIAGNOSIS — R002 Palpitations: Secondary | ICD-10-CM | POA: Diagnosis not present

## 2023-10-19 MED ORDER — ROSUVASTATIN CALCIUM 5 MG PO TABS: 10.0000 mg | ORAL_TABLET | Freq: Every day | ORAL | Status: DC

## 2023-10-19 MED ORDER — ROSUVASTATIN CALCIUM 10 MG PO TABS: 10.0000 mg | ORAL_TABLET | Freq: Every day | ORAL | 0 refills | Status: AC

## 2023-10-19 MED ORDER — PROPRANOLOL HCL 10 MG PO TABS: 10.0000 mg | ORAL_TABLET | Freq: Three times a day (TID) | ORAL | 0 refills | Status: AC | PRN

## 2023-10-19 MED ORDER — ASPIRIN 81 MG PO TBEC: 81.0000 mg | DELAYED_RELEASE_TABLET | Freq: Every day | ORAL | 12 refills | Status: DC

## 2023-10-19 MED ORDER — TOPIRAMATE 25 MG PO TABS: 25.0000 mg | ORAL_TABLET | Freq: Every day | ORAL | Status: DC

## 2023-10-19 MED ORDER — PROPRANOLOL HCL 10 MG PO TABS: 10.0000 mg | ORAL_TABLET | Freq: Three times a day (TID) | ORAL | Status: DC | PRN

## 2023-10-19 NOTE — Evaluation (Signed)
 Speech Language Pathology Evaluation Patient Details Name: Mckenzie Park MRN: [id number] DOB: 1976-03-18 Today's Date: 10/19/2023 Time: 1050-1104 SLP Time Calculation (min) (ACUTE ONLY): 14 min  Problem List:  Patient Active Problem List   Diagnosis Date Noted   Stroke determined by clinical assessment (HCC) 10/17/2023   Mild obstructive sleep apnea 09/12/2022   Asthma 09/12/2022   Chronic migraine without aura without status migrainosus, not intractable 08/01/2015   Essential hypertension 08/01/2015   Depression 08/01/2015   Past Medical History:  Past Medical History:  Diagnosis Date   Anxiety    Depression    GERD (gastroesophageal reflux disease)    Heart murmur    irregular heart beat is on medication   Hyperlipidemia    Hypertension    Kidney stone    Past Surgical History:  Past Surgical History:  Procedure Laterality Date   ABDOMINAL HYSTERECTOMY     CHOLECYSTECTOMY     CORONARY PRESSURE/FFR STUDY N/A 06/12/2023   Procedure: CORONARY PRESSURE/FFR STUDY;  Surgeon: Knox Perl, MD;  Location: [facility];  Service: Cardiovascular;  Laterality: N/A;   kidney stone removal     KNEE ARTHROSCOPY Bilateral    LEFT HEART CATH AND CORONARY ANGIOGRAPHY N/A 06/12/2023   Procedure: LEFT HEART CATH AND CORONARY ANGIOGRAPHY;  Surgeon: Knox Perl, MD;  Location: [facility];  Service: Cardiovascular;  Laterality: N/A;   TUBAL LIGATION     HPI:  Pt is 48 yo female who presents on 10/17/23 with R sided facial numbness that progressed to RUE and RLE.  Received TNK 1050. Admitted for management of stroke vs complex migraines. MRI negative. Recently seen in ED 5/21 for pyelonephritis. PMH: HTN, HLD, heart murmur, kidney stone, GERD, anxiety, depression, and obesity (BMI 41)   Assessment / Plan / Recommendation Clinical Impression  Pt states she lives at home with her spouse and other family, where she works third shift and is typically independent. She denies acute  concerns with cognition. Portions of the Cognistat were completed with pt exhibiting difficulty with sustained attention, calculations, and reasoning. She reports baseline difficulty with math but was unable to complete calculations involving more complex functions than addition. She had significant difficulty with both digit and sentence repetition, which is suspected to be secondary to attention deficits. Discussed f/u on an OP basis to target high level attention tasks with functional applications in addition to ongoing acute intervention. Will continue following.     SLP Assessment  SLP Recommendation/Assessment: Patient needs continued Speech Lanaguage Pathology Services SLP Visit Diagnosis: Cognitive communication deficit (R41.841)    Recommendations for follow up therapy are one component of a multi-disciplinary discharge planning process, led by the attending physician.  Recommendations may be updated based on patient status, additional functional criteria and insurance authorization.    Follow Up Recommendations  Outpatient SLP    Assistance Recommended at Discharge  Set up Supervision/Assistance  Functional Status Assessment Patient has had a recent decline in their functional status and demonstrates the ability to make significant improvements in function in a reasonable and predictable amount of time.  Frequency and Duration min 1 x/week  2 weeks      SLP Evaluation Cognition  Overall Cognitive Status: Impaired/Different from baseline Arousal/Alertness: Awake/alert Orientation Level: Oriented X4 Attention: Sustained Sustained Attention: Impaired Sustained Attention Impairment: Verbal basic;Functional basic Memory: Appears intact Awareness: Impaired Awareness Impairment: Emergent impairment Problem Solving: Impaired Problem Solving Impairment: Verbal basic       Comprehension  Auditory Comprehension Overall Auditory Comprehension:  Appears within functional limits for  tasks assessed    Expression Expression Primary Mode of Expression: Verbal Verbal Expression Overall Verbal Expression: Appears within functional limits for tasks assessed   Oral / Motor  Oral Motor/Sensory Function Overall Oral Motor/Sensory Function: Within functional limits Motor Speech Overall Motor Speech: Appears within functional limits for tasks assessed            Amil Kale, M.A., CCC-SLP Speech Language Pathology, Acute Rehabilitation Services  Secure Chat preferred [phone]  10/19/2023, 11:48 AM

## 2023-10-19 NOTE — Progress Notes (Signed)
 Patient Name: Mckenzie Park Date of Encounter: 10/19/2023 [facility] Cardiologist: Knox Perl, MD  10/17/2023 .admit Length of stay: 2  Interval Summary  .    Mckenzie Park is a 48 y.o. Caucasian female with hypertension, fibromyalgia, morbid obesity, family history of aortic dissection in her brother at age 8 died suddenly with aortic dissection, known to have CAD as well. In view of her family history of premature coronary artery disease, father having had aortic valve disease and aortic valve replacement, she had normal coronary arteries and calcium  score of 0 by CTA of the coronaries on 11/11/2019 with no significant aortic abnormality.  She has extreme anxiety, in view of her atypical presentation of chest pain, she underwent cardiac catheterization in January 2025 revealing normal coronary arteries.  Now admitted with strokelike symptoms and received IV tPA.  We were consulted for evaluation of asymptomatic sinus bradycardia.  Patient seen at the bedside, husband is present at the bedside as well.  Remains asymptomatic.  Physical Exam    Vitals:   10/19/23 0600 10/19/23 0700 10/19/23 0800 10/19/23 0900  BP: 125/61 130/63 124/62 133/64  Pulse: (!) 35 (!) 40    Resp: 13 15    Temp:  (!) 97.5 F (36.4 C)    TempSrc:  Oral    SpO2: 96% 100%    Weight:      Height:       Physical Exam Constitutional:      Appearance: She is obese.  Neck:     Vascular: No carotid bruit or JVD.  Cardiovascular:     Rate and Rhythm: Normal rate and regular rhythm.     Pulses: Intact distal pulses.     Heart sounds: Normal heart sounds. No murmur heard.    No gallop.  Pulmonary:     Effort: Pulmonary effort is normal.     Breath sounds: Normal breath sounds.  Abdominal:     General: Bowel sounds are normal.     Palpations: Abdomen is soft.  Musculoskeletal:     Right lower leg: No edema.     Left lower leg: No edema.        10/17/2023    7:00 PM 10/17/2023   10:50 AM  07/09/2023   11:00 AM  Last 3 Weights  Weight (lbs) 247 lb 2.2 oz 249 lb 9 oz 227 lb 6.4 oz  Weight (kg) 112.1 kg 113.2 kg 103.148 kg      Labs   Lab Results  Component Value Date   NA 140 10/18/2023   K 4.3 10/18/2023   CO2 25 10/18/2023   GLUCOSE 97 10/18/2023   BUN 9 10/18/2023   CREATININE 0.91 10/18/2023   CALCIUM  8.5 (L) 10/18/2023   EGFR 114 05/28/2021   GFRNONAA >60 10/18/2023       Latest Ref Rng & Units 10/18/2023    5:08 AM 10/17/2023   10:38 AM 10/17/2023   10:30 AM  BMP  Glucose 70 - 99 mg/dL 97  95  604   BUN 6 - 20 mg/dL 9  13  12    Creatinine 0.44 - 1.00 mg/dL 5.40  9.81  1.91   Sodium 135 - 145 mmol/L 140  138  136   Potassium 3.5 - 5.1 mmol/L 4.3  4.6  4.6   Chloride 98 - 111 mmol/L 109  106  106   CO2 22 - 32 mmol/L 25   24   Calcium  8.9 - 10.3 mg/dL 8.5   8.9  Latest Ref Rng & Units 10/18/2023    5:08 AM 10/17/2023   10:38 AM 10/17/2023   10:30 AM  CBC  WBC 4.0 - 10.5 K/uL 5.3   7.0   Hemoglobin 12.0 - 15.0 g/dL 40.9  81.1  91.4   Hematocrit 36.0 - 46.0 % 35.6  35.0  34.9   Platelets 150 - 400 K/uL 256   305     Lab Results  Component Value Date   CHOL 101 10/18/2023   HDL 46 10/18/2023   LDLCALC 45 10/18/2023   TRIG 52 10/18/2023   CHOLHDL 2.2 10/18/2023    Lab Results  Component Value Date   TSH 3.76 05/28/2021    Lab Results  Component Value Date   HGBA1C 5.5 10/18/2023    Cardiac Panel (last 3 results) Recent Labs    10/18/23 1047 10/18/23 1140  CKTOTAL 68  --   CKMB 1.5  --   TROPONINIHS 4 4    BNP (last 3 results) Recent Labs    10/18/23 1048  BNP 362.3*   Tele/EKG/Cardiac studies    Telemetry: Sinus bradycardia, no other significant arrhythmias, no high degree AV block.  ECHOCARDIOGRAM COMPLETE   Collection Time: 10/17/23  2:51 PM  1. Left ventricular ejection fraction, by estimation, is 55 to 60%. The left ventricle has normal function. The left ventricle has no regional wall motion abnormalities.  The left ventricular internal cavity size was mildly dilated. Left ventricular  diastolic parameters were normal.  2. Right ventricular systolic function is normal. The right ventricular size is normal.  3. Left atrial size was severely dilated.  4. The mitral valve is normal in structure. Mild mitral valve regurgitation. No evidence of mitral stenosis.  5. Tricuspid valve regurgitation is moderate.  6. The aortic valve is tricuspid. Aortic valve regurgitation is not visualized. No aortic stenosis is present.  7. The inferior vena cava is dilated in size with >50% respiratory variability, suggesting right atrial pressure of 8 mmHg.  CARDIAC CATHETERIZATION 06/12/2023  Conclusion Images from the original result were not included. Left Heart Catheterization & microvascular study using [employer] software and Pressure wire-X.  06/12/23: Hemodynamic data: LVEDP 20 mmHg.  There is no pressure gradient across aortic valve.  Angiographic data: LM: Normal. LAD: Normal.  Small D1 and moderate-sized D2 and D3, LAD ends at the apex. CX: Normal.  Large-caliber vessel, gives origin to a very small OM1 and a large OM 2 and OM 3. RCA: Normal.  Large-caliber vessel, moderate-sized PDA and PL branches.   Impression and recommendations: Normal coronary arteries.  Normal microcirculatory function.  Catheter induced proximal LAD spasm.  Do not suspect coronary spasm.  Recommendations: Evaluate for noncardiac causes of chest pain.    Radiology  No focal deficits by MRI and CTA no significant large vessel disease  Current Meds:     Current Facility-Administered Medications:    acetaminophen  (TYLENOL ) tablet 650 mg, 650 mg, Oral, Q4H PRN, 650 mg at 10/19/23 0933 **OR** acetaminophen  (TYLENOL ) 160 MG/5ML solution 650 mg, 650 mg, Per Tube, Q4H PRN **OR** acetaminophen  (TYLENOL ) suppository 650 mg, 650 mg, Rectal, Q4H PRN, de Thayne Fine, Cortney E, NP   albuterol  (PROVENTIL ) (2.5 MG/3ML) 0.083% nebulizer  solution 3 mL, 3 mL, Inhalation, Q6H PRN, de Thayne Fine, Cortney E, NP   ALPRAZolam [place]) tablet 0.5 mg, 0.5 mg, Oral, TID PRN, de Thayne Fine, Cortney E, NP, 0.5 mg at 10/18/23 7829   aspirin  EC tablet 81 mg, 81 mg, Oral, Daily, Lehner,  Rosette Console, NP, 81 mg at 10/19/23 5784   budesonide-glycopyrrolate-formoterol (BREZTRI ) 160-9-4.8 MCG/ACT inhaler 2 puff, 2 puff, Inhalation, BID, Bhagat, Srishti L, MD, 2 puff at 10/19/23 0929   Chlorhexidine Gluconate Cloth 2 % PADS 6 each, 6 each, Topical, Daily, Bhagat, Srishti L, MD, 6 each at 10/18/23 1410   cholecalciferol (VITAMIN D3) 25 MCG (1000 UNIT) tablet 5,000 Units, 5,000 Units, Oral, Daily, de Thayne Fine, [place] E, NP, 5,000 Units at 10/19/23 0928   enoxaparin (LOVENOX) injection 55 mg, 55 mg, Subcutaneous, Q24H, Karel Osler, RPH, 55 mg at 10/18/23 1746   escitalopram (LEXAPRO) tablet 20 mg, 20 mg, Oral, Daily, de La Torre, Howard E, NP, 20 mg at 10/19/23 6962   ezetimibe  (ZETIA ) tablet 10 mg, 10 mg, Oral, QPM, de Thayne Fine, [place] E, NP, 10 mg at 10/18/23 1746   lamoTRIgine (LAMICTAL) tablet 25 mg, 25 mg, Oral, BID, Lehner, Erin C, NP, 25 mg at 10/19/23 9528   ondansetron  (ZOFRAN -ODT) disintegrating tablet 4 mg, 4 mg, Oral, Q8H PRN, de Thayne Fine, Cortney E, NP   Oral care mouth rinse, 15 mL, Mouth Rinse, PRN, Bhagat, Srishti L, MD   pantoprazole  (PROTONIX ) injection 40 mg, 40 mg, Intravenous, QHS, de [place] Torre, [place] E, NP, 40 mg at 10/18/23 2100   rosuvastatin  (CRESTOR ) tablet 5 mg, 5 mg, Oral, Daily, de La Torre, [place] E, NP, 5 mg at 10/19/23 4132   senna-docusate (Senokot-S) tablet 1 tablet, 1 tablet, Oral, QHS PRN, de Thayne Fine, Cortney E, NP   sodium chloride  flush (NS) 0.9 % injection 3 mL, 3 mL, Intravenous, Once, Lind Repine, MD   sulfamethoxazole -trimethoprim  (BACTRIM  DS) 800-160 MG per tablet 1 tablet, 1 tablet, Oral, Q12H, de Thayne Fine, Cortney E, NP, 1 tablet at 10/19/23 4401  Assessment & Plan .     1.  Sinus bradycardia Sinus node  dysfunction secondary to beta-blocker use due to significant palpitations.  I have known her valve, she has severe palpitations and significant anxiety as well without use of the medications, would recommend restarting propranolol  at 10 mg 3 times daily as needed for palpitations once discharged.  Clear-cut infection to the patient and her husband was given by me.  I made her walk in the hallway, heart rate 100 bpm.  She has not had any high degree AV block.  She remains asymptomatic.  2.  Neurologic deficit?  Complex migraine versus functional disorder Extensive imaging reviewed, no focal deficits, did receive tPA in view of her presentation however there was also?  Regarding her presentation suggesting functional disorder as well.  Overall no cardiac issues, she has normal coronary arteries, please call if questions.  She had an appointment to see me sometime in June, I will cancel this as she has been on telemetry and has not had any heart block.  She is also recently been diagnosed with sleep apnea, I discussed with her and her husband regarding the importance of treating sleep apnea.  Will sign off.  For questions or updates, please contact [facility] Please consult [email or web address] for contact info under        Signed,   Knox Perl, MD, [facility] 10/19/2023, 9:53 AM [facility] [address] [place], [place] [address] Phone: [phone]. Fax:  [phone]

## 2023-10-19 NOTE — Evaluation (Addendum)
 Physical Therapy Evaluation Patient Details Name: Mckenzie Park MRN: [id number] DOB: 05-Feb-1976 Today's Date: 10/19/2023  History of Present Illness  Pt is 48 yo female who presents on 10/17/23 with R sided facial numbness that progressed to RUE and RLE.  Received TNK 1050. Admitted for management of stroke vs complex migraines. MRI negative. Recently seen in ED 5/21 for pyelonephritis. PMH: HTN, HLD, heart murmur, kidney stone, GERD, anxiety, depression, and obesity (BMI 41).   Clinical Impression  Pt in bed upon arrival of PT, agreeable to evaluation at this time. Prior to admission the pt was independent with mobility, working as a Customer service manager at [employer], and living with her spouse in a home with 2 steps to enter. The pt reports attending gym 3x/week and 6 month hx of heaviness and chest pressure with exertion both at gym and work. The pt was able to complete sit-stand transfers without support, fatigues with gait after ~75 ft, HR to 60bpm and pt reports chest tightness and heaviness. She was able to recover with standing rest and complete 2 stairs to mimic entry to home. Given generally poor activity tolerance and pt desire to improve capacity to manage at work (walking through store and lifting merchandise), recommend follow up OPPT, but is safe to return home with family support once medically cleared.     If plan is discharge home, recommend the following: A little help with walking and/or transfers;Help with stairs or ramp for entrance   Can travel by private vehicle        Equipment Recommendations None recommended by PT  Recommendations for Other Services       Functional Status Assessment Patient has not had a recent decline in their functional status     Precautions / Restrictions Precautions Precautions: Fall Recall of Precautions/Restrictions: Intact Precaution/Restrictions Comments: bradycardia Restrictions Weight Bearing Restrictions Per Provider Order: No       Mobility  Bed Mobility Overal bed mobility: Needs Assistance Bed Mobility: Sit to Supine       Sit to supine: Supervision   General bed mobility comments: pt able to return to bed and reposition without assistance    Transfers Overall transfer level: Needs assistance Equipment used: None Transfers: Sit to/from Stand Sit to Stand: Supervision           General transfer comment: supervision for safety, no sway or LOB    Ambulation/Gait Ambulation/Gait assistance: Supervision Gait Distance (Feet): 75 Feet (+ 75) Assistive device: None Gait Pattern/deviations: Step-through pattern, Decreased stride length, Wide base of support Gait velocity: decreased     General Gait Details: pt with slow gait and increased sway with wide BOS. reports chest tightness and pain with exertion, HR to 60bpm. recovers with standing rest  Stairs Stairs: Yes Stairs assistance: Supervision Stair Management: Step to pattern, Forwards Number of Stairs: 2 General stair comments: no rail to mimic home set up. LUE on wall. VSS    Balance Overall balance assessment: Mild deficits observed, not formally tested                                           Pertinent Vitals/Pain Pain Assessment Pain Assessment: 0-10 Pain Score: 5  Pain Location: chest Pain Descriptors / Indicators: Sharp, Shooting, Heaviness Pain Intervention(s): Limited activity within patient's tolerance, Monitored during session, Repositioned    Home Living Family/patient expects to be discharged to:: Private residence  Living Arrangements: Spouse/significant other;Children;Other relatives (spouse, daughter, grand daughter, mother in law) Available Help at Discharge: Family;Available 24 hours/day;Available PRN/intermittently Type of Home: House Home Access: Stairs to enter Entrance Stairs-Rails:  (FRONT BIL RAILS) Entrance Stairs-Number of Steps: 2 on side where pt enters , 6 in front and then 2 steps up    Home Layout: One level Home Equipment: Hand held shower head;Grab bars - tub/shower      Prior Function Prior Level of Function : Independent/Modified Independent;Driving             Mobility Comments: independent, reports chest tightness and sharp pain for 6 months with any exertion ADLs Comments: Independent, no AD, 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     Extremity/Trunk Assessment   Upper Extremity Assessment Upper Extremity Assessment: Defer to OT evaluation    Lower Extremity Assessment Lower Extremity Assessment: Overall WFL for tasks assessed (grossly 5/5 other than R hip 4/5)    Cervical / Trunk Assessment Cervical / Trunk Assessment: Normal  Communication   Communication Communication: No apparent difficulties    Cognition Arousal: Alert Behavior During Therapy: WFL for tasks assessed/performed   PT - Cognitive impairments: No apparent impairments                         Following commands: Intact       Cueing Cueing Techniques: Verbal cues     General Comments General comments (skin integrity, edema, etc.): poor endurance, poor sleep hygiene (pt had been working 3rd shift and sleeping very little) educated on role of sleep in recovery and importance for physical function.        Assessment/Plan    PT Assessment Patient needs continued PT services  PT Problem List Cardiopulmonary status limiting activity;Decreased activity tolerance;Decreased strength       PT Treatment Interventions DME instruction;Gait training;Stair training;Functional mobility training;Therapeutic activities;Therapeutic exercise;Balance training;Patient/family education    PT Goals (Current goals can be found in the Care Plan section)  Acute Rehab PT Goals Patient Stated Goal: return home and reduce chest pain PT Goal Formulation: With patient Time For Goal Achievement: 11/02/23 Potential to Achieve Goals: Good    Frequency Min 1X/week         AM-PAC PT "6 Clicks" Mobility  Outcome Measure Help needed turning from your back to your side while in a flat bed without using bedrails?: None Help needed moving from lying on your back to sitting on the side of a flat bed without using bedrails?: None Help needed moving to and from a bed to a chair (including a wheelchair)?: None Help needed standing up from a chair using your arms (e.g., wheelchair or bedside chair)?: None Help needed to walk in hospital room?: A Little Help needed climbing 3-5 steps with a railing? : A Little 6 Click Score: 22    End of Session Equipment Utilized During Treatment: Gait belt Activity Tolerance: Patient tolerated treatment well Patient left: in bed;with call bell/phone within reach;with bed alarm set;with family/visitor present Nurse Communication: Mobility status PT Visit Diagnosis: Unsteadiness on feet (R26.81);Other abnormalities of gait and mobility (R26.89)    Time: 1003-1040 PT Time Calculation (min) (ACUTE ONLY): 37 min   Charges:   PT Evaluation $PT Eval Moderate Complexity: 1 Mod PT Treatments $Therapeutic Exercise: 8-22 mins PT General Charges $$ ACUTE PT VISIT: 1 Visit         Barnabas Booth, PT, DPT   Acute Rehabilitation Department Office [phone] Secure  Chat Communication Preferred  Lona Rist 10/19/2023, 12:42 PM

## 2023-10-19 NOTE — Discharge Summary (Addendum)
 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 Symptomatic Hypotension/bradycardia  Secondary diagnosis Palpitation Bradycardia Angina pectoris Anxiety Hyperlipidemia Obesity Migraine  Patient Active Problem List   Diagnosis Date Noted   Stroke determined by clinical assessment (HCC) 10/17/2023   Mild obstructive sleep apnea 09/12/2022   Asthma 09/12/2022   Chronic migraine without aura without status migrainosus, not intractable 08/01/2015   Essential hypertension 08/01/2015   Depression 08/01/2015     Allergies as of 10/19/2023   No Known Allergies      Medication List     TAKE these medications    albuterol  108 (90 Base) MCG/ACT inhaler Commonly known as: VENTOLIN  HFA Inhale 2 puffs into the lungs every 6 (six) hours as needed for wheezing or shortness of breath.   ALPRAZolam  0.5 MG tablet Commonly known as: XANAX  Take 0.5 mg by mouth 2 (two) times daily.   aspirin  EC 81 MG tablet Take 1 tablet (81 mg total) by mouth daily. Swallow whole. Start taking on: Oct 20, 2023   Breztri  Aerosphere 160-9-4.8 MCG/ACT Aero inhaler Generic drug: budesonide -glycopyrrolate -formoterol  INHALE 2 PUFFS IN THE MORNING AND AT BEDTIME What changed: See the new instructions.   cyclobenzaprine 5 MG tablet Commonly known as: FLEXERIL Take 5 mg by mouth 3 (three) times daily as needed for muscle spasms.   D-3-5 125 MCG (5000 UT) capsule Generic drug: Cholecalciferol  Take 5,000 Units by mouth daily.   escitalopram  20 MG tablet Commonly known as: LEXAPRO  Take 20 mg by mouth daily.   ezetimibe  10 MG tablet Commonly known as: ZETIA  TAKE 1 TABLET BY MOUTH ONCE DAILY  IN THE EVENING   HYDROcodone-acetaminophen  5-325 MG tablet Commonly known as: NORCO/VICODIN Take 1-2 tablets by mouth every 6 (six) hours as needed for moderate pain (pain score 4-6) or severe pain (pain score 7-10).   isosorbide  mononitrate 30 MG 24 hr tablet Commonly known as: IMDUR  Take 1 tablet (30 mg total) by mouth daily.   lamoTRIgine  25 MG tablet Commonly known as: LAMICTAL  Take 25 mg by mouth 2 (two) times daily.   losartan  100 MG tablet Commonly known as: COZAAR  Take 1 tablet by mouth in the evening   Lurasidone HCl 60 MG Tabs Take 60 mg by mouth at bedtime.   nitroGLYCERIN  0.4 MG SL tablet Commonly known as: NITROSTAT  Place 1 tablet (0.4 mg total) under the tongue every 5 (five) minutes as needed for up to 25 days for chest pain.   ondansetron  4 MG disintegrating tablet Commonly known as: ZOFRAN -ODT Take 4 mg by mouth every 8 (eight) hours as needed for nausea or vomiting.   propranolol  10 MG tablet Commonly known as: INDERAL  Take 1 tablet (10 mg total) by mouth 3 (three) times daily as needed (palpations). What changed:  medication strength See the new instructions.   rosuvastatin  10 MG tablet Commonly known as: CRESTOR  Take 1 tablet (10 mg total) by mouth daily. Start taking on: Oct 20, 2023 What changed: See the new instructions.   sulfamethoxazole -trimethoprim  800-160 MG tablet Commonly known as: BACTRIM  DS Take 1 tablet by mouth 2 (two) times daily.   topiramate  25 MG capsule Commonly known as: TOPAMAX  Take 25 mg by mouth  daily.   traZODone 50 MG tablet Commonly known as: DESYREL Take 1-2 tablets by mouth at bedtime as needed for sleep.   verapamil  180 MG CR tablet Commonly known as: CALAN -SR Take 180 mg by mouth at bedtime.        LABORATORY STUDIES CBC    Component Value Date/Time   WBC 5.3 10/18/2023 0508   RBC 3.98 10/18/2023 0508   HGB 11.7 (L) 10/18/2023 0508   HCT 35.6 (L) 10/18/2023 0508   PLT 256 10/18/2023 0508   MCV  89.4 10/18/2023 0508   MCH 29.4 10/18/2023 0508   MCHC 32.9 10/18/2023 0508   RDW 14.0 10/18/2023 0508   LYMPHSABS 1.5 10/17/2023 1030   MONOABS 0.5 10/17/2023 1030   EOSABS 0.2 10/17/2023 1030   BASOSABS 0.1 10/17/2023 1030   CMP    Component Value Date/Time   NA 140 10/18/2023 0508   NA 141 04/10/2021 0930   K 4.3 10/18/2023 0508   CL 109 10/18/2023 0508   CO2 25 10/18/2023 0508   GLUCOSE 97 10/18/2023 0508   BUN 9 10/18/2023 0508   BUN 9 04/10/2021 0930   CREATININE 0.91 10/18/2023 0508   CREATININE 0.56 05/28/2021 0909   CALCIUM  8.5 (L) 10/18/2023 0508   PROT 6.2 (L) 10/18/2023 0508   ALBUMIN 3.2 (L) 10/18/2023 0508   AST 30 10/18/2023 0508   ALT 28 10/18/2023 0508   ALKPHOS 68 10/18/2023 0508   BILITOT 0.5 10/18/2023 0508   GFRNONAA >60 10/18/2023 0508   GFRAA 128 11/09/2019 1134   COAGS Lab Results  Component Value Date   INR 1.0 10/17/2023   Lipid Panel    Component Value Date/Time   CHOL 101 10/18/2023 0508   TRIG 52 10/18/2023 0508   HDL 46 10/18/2023 0508   CHOLHDL 2.2 10/18/2023 0508   VLDL 10 10/18/2023 0508   LDLCALC 45 10/18/2023 0508   HgbA1C  Lab Results  Component Value Date   HGBA1C 5.5 10/18/2023   Alcohol  Level    Component Value Date/Time   ETH <15 10/17/2023 1030     SIGNIFICANT DIAGNOSTIC STUDIES CT HEAD WO CONTRAST ( ) Result Date: 10/18/2023 EXAM: CT HEAD WITHOUT 10/18/2023 11:00:33 AM TECHNIQUE: CT of the head was performed without the administration of intravenous contrast. Automated exposure control, iterative reconstruction, and/or weight based adjustment of the mA/kV was utilized to reduce the radiation dose to as low as reasonably achievable. COMPARISON: CT head without contrast 10/17/2023. MR head without contrast 10/17/2023. CLINICAL HISTORY: Stroke, follow up. FINDINGS: BRAIN AND VENTRICLES: There is no acute intracranial hemorrhage, mass effect or midline shift. No abnormal extra-axial fluid collection. The gray-white  differentiation is maintained without evidence of an acute infarct. There is no evidence of hydrocephalus. ORBITS: The visualized portion of the orbits demonstrate no acute abnormality. SINUSES: The visualized paranasal sinuses and mastoid air cells demonstrate no acute abnormality. SOFT TISSUES AND SKULL: No acute abnormality of the visualized skull or soft tissues. IMPRESSION: 1. No acute intracranial abnormality. Electronically signed by: Audree Leas MD 10/18/2023 04:52 PM EDT RP Workstation: [id number]   CT HEAD WO CONTRAST ( ) Result Date: 10/17/2023 CLINICAL DATA:  headache post TNK EXAM: CT HEAD WITHOUT CONTRAST TECHNIQUE: Contiguous axial images were obtained from the base of the skull through the vertex without intravenous contrast. RADIATION DOSE REDUCTION: This exam was performed according to the departmental dose-optimization program which includes automated exposure control, adjustment of the mA and/or kV according to patient size and/or use of iterative reconstruction technique. COMPARISON:  CT head 10/17/2023 10:42 a.m., MRI head 10/17/2023 FINDINGS: Brain: No evidence of large-territorial acute infarction. No parenchymal hemorrhage. No mass lesion. No extra-axial collection. No mass effect or midline shift. No hydrocephalus. Basilar cisterns are patent. Vascular: No hyperdense vessel. Skull: No acute fracture or focal lesion. Sinuses/Orbits: Paranasal sinuses and mastoid air cells are clear. The orbits are unremarkable. Other: None. IMPRESSION: No acute intracranial abnormality. Electronically Signed   By: Morgane  Naveau M.D.   On: 10/17/2023 19:09   ECHOCARDIOGRAM COMPLETE Result Date: 10/17/2023    ECHOCARDIOGRAM REPORT   Patient Name:   Malak Duchesneau Date of Exam: 10/17/2023 Medical Rec #:  [id number]    Height:       66.0 in Accession #:    [id number]   Weight:       249.6 lb Date of Birth:  [birth year]   BSA:          2.197 m Patient Age:    47 years     BP:           119/73 mmHg  Patient Gender: F            HR:           39 bpm. Exam Location:  Inpatient Procedure: 2D Echo, Cardiac Doppler and Color Doppler (Both Spectral and Color            Flow Doppler were utilized during procedure). Indications:    Stroke  History:        Patient has prior history of Echocardiogram examinations, most                 recent 08/25/2022.  Sonographer:    Andrena Bang Referring Phys: Leandro Proffer [place] IMPRESSIONS  1. Left ventricular ejection fraction, by estimation, is 55 to 60%. The left ventricle has normal function. The left ventricle has no regional wall motion abnormalities. The left ventricular internal cavity size was mildly dilated. Left ventricular diastolic parameters were normal.  2. Right ventricular systolic function is normal. The right ventricular size is normal.  3. Left atrial size was severely dilated.  4. The mitral valve is normal in structure. Mild mitral valve regurgitation. No evidence of mitral stenosis.  5. Tricuspid valve regurgitation is moderate.  6. The aortic valve is tricuspid. Aortic valve regurgitation is not visualized. No aortic stenosis is present.  7. The inferior vena cava is dilated in size with >50% respiratory variability, suggesting right atrial pressure of 8 mmHg. Comparison(s): No prior Echocardiogram. FINDINGS  Left Ventricle: Left ventricular ejection fraction, by estimation, is 55 to 60%. The left ventricle has normal function. The left ventricle has no regional wall motion abnormalities. The left ventricular internal cavity size was mildly dilated. There is  no left ventricular hypertrophy. Left ventricular diastolic parameters were normal. Right Ventricle: The right ventricular size is normal. Right ventricular systolic function is normal. Left Atrium: Left atrial size was severely dilated. Right Atrium: Right atrial size was normal in size. Pericardium: There is no evidence of pericardial effusion. Mitral Valve: The mitral valve is normal in structure.  Mild mitral valve regurgitation. No evidence of mitral valve stenosis. Tricuspid Valve: The tricuspid valve is normal in structure. Tricuspid valve regurgitation is moderate . No evidence of tricuspid stenosis. Aortic Valve: The aortic valve is tricuspid. Aortic valve regurgitation is not visualized. No aortic stenosis is present. Aortic valve mean gradient measures 4.0 mmHg. Aortic valve peak gradient measures 8.9 mmHg. Pulmonic Valve: The pulmonic valve was normal  in structure. Pulmonic valve regurgitation is trivial. No evidence of pulmonic stenosis. Aorta: The aortic root is normal in size and structure. Venous: The inferior vena cava is dilated in size with greater than 50% respiratory variability, suggesting right atrial pressure of 8 mmHg. IAS/Shunts: No atrial level shunt detected by color flow Doppler.  LEFT VENTRICLE PLAX 2D LVIDd:         5.50 cm      Diastology LVIDs:         3.60 cm      LV e' medial:    8.70 cm/s LV PW:         0.80 cm      LV E/e' medial:  9.3 LV IVS:        0.80 cm      LV e' lateral:   19.00 cm/s LVOT diam:     1.80 cm      LV E/e' lateral: 4.3 LV SV:         65 LV SV Index:   30 LVOT Area:     2.54 cm  LV Volumes (MOD) LV vol d, MOD A2C: 178.0 ml LV vol d, MOD A4C: 108.0 ml LV vol s, MOD A2C: 41.8 ml LV vol s, MOD A4C: 40.1 ml LV SV MOD A2C:     136.2 ml LV SV MOD A4C:     108.0 ml LV SV MOD BP:      99.0 ml RIGHT VENTRICLE RV S prime:     13.70 cm/s TAPSE (M-mode): 2.4 cm LEFT ATRIUM              Index LA diam:        5.00 cm  2.28 cm/m LA Vol (A2C):   103.0 ml 46.88 ml/m LA Vol (A4C):   114.0 ml 51.89 ml/m LA Biplane Vol: 119.0 ml 54.16 ml/m  AORTIC VALVE AV Area (Vmax):    1.79 cm AV Area (Vmean):   1.65 cm AV Area (VTI):     1.79 cm AV Vmax:           149.00 cm/s AV Vmean:          96.400 cm/s AV VTI:            0.363 m AV Peak Grad:      8.9 mmHg AV Mean Grad:      4.0 mmHg LVOT Vmax:         105.00 cm/s LVOT Vmean:        62.500 cm/s LVOT VTI:          0.256 m LVOT/AV  VTI ratio: 0.71  AORTA Ao Asc diam: 3.20 cm MITRAL VALVE MV Area (PHT): 1.76 cm    SHUNTS MV Decel Time: 431 msec    Systemic VTI:  0.26 m MV E velocity: 81.20 cm/s  Systemic Diam: 1.80 cm MV A velocity: 43.00 cm/s MV E/A ratio:  1.89 Alexandria Angel MD Electronically signed by Alexandria Angel MD Signature Date/Time: 10/17/2023/3:01:15 PM    Final    MR BRAIN WO CONTRAST Result Date: 10/17/2023 CLINICAL DATA:  Stroke follow-up EXAM: MRI HEAD WITHOUT CONTRAST TECHNIQUE: Multiplanar, multiecho pulse sequences of the brain and surrounding structures were obtained without intravenous contrast. COMPARISON:  Head CT and CTA from earlier today FINDINGS: Brain: No acute infarction, hemorrhage, hydrocephalus, extra-axial collection or mass lesion. Vascular: Normal flow voids. Skull and upper cervical spine: Normal marrow signal. Sinuses/Orbits: T2 hyperintensity at the right petrous apex, circumscribed by CT, likely trapped  fluid. No T1 or diffusion hyperintensity. Other: Subcutaneous susceptibility artifact at the high left scalp, likely chronic by CT. IMPRESSION: Normal appearance of the brain.  No infarct. Electronically Signed   By: Ronnette Coke M.D.   On: 10/17/2023 12:52   CT ANGIO HEAD NECK W WO CM Result Date: 10/17/2023 CLINICAL DATA:  Right-sided facial numbness/weakness. EXAM: CT ANGIOGRAPHY HEAD AND NECK WITH AND WITHOUT CONTRAST TECHNIQUE: Multidetector CT imaging of the head and neck was performed using the standard protocol during bolus administration of intravenous contrast. Multiplanar CT image reconstructions and MIPs were obtained to evaluate the vascular anatomy. Carotid stenosis measurements (when applicable) are obtained utilizing NASCET criteria, using the distal internal carotid diameter as the denominator. RADIATION DOSE REDUCTION: This exam was performed according to the departmental dose-optimization program which includes automated exposure control, adjustment of the mA and/or kV according  to patient size and/or use of iterative reconstruction technique. CONTRAST:  75mL ISOVUE -370 IOPAMIDOL  (ISOVUE -370) INJECTION 76% COMPARISON:  Head CT earlier today FINDINGS: CTA NECK FINDINGS Aortic arch: Unremarkable Right carotid system: Vessels are smoothly contoured and diffusely patent. No atheromatous change. Left carotid system: Vessels are smoothly contoured and diffusely patent. No atheromatous change. Vertebral arteries: Vessels are smoothly contoured and diffusely patent. No atheromatous change. Skeleton: Unremarkable Other neck: Negative. Upper chest: No acute finding Review of the MIP images confirms the above findings CTA HEAD FINDINGS Anterior circulation: No significant stenosis, proximal occlusion, aneurysm, or vascular malformation. Posterior circulation: No significant stenosis, proximal occlusion, aneurysm, or vascular malformation. Venous sinuses: Unremarkable for arterial timing Anatomic variants: None significant Review of the MIP images confirms the above findings IMPRESSION: Negative CTA.  No explanation for symptoms. Electronically Signed   By: Ronnette Coke M.D.   On: 10/17/2023 11:24   CT HEAD CODE STROKE WO CONTRAST Result Date: 10/17/2023 EXAM: CT Head Without Intravenous Contrast. CLINICAL HISTORY: Neuro deficit, acute, stroke suspected. Non Con. Code Stroke. Right side facial numbness/weakness. TECHNIQUE: Axial computed tomography images of the head/brain without intravenous contrast. Dose reduction technique was used including one or more of the following: automated exposure control, adjustment of mA and kV according to patient size, and/or iterative reconstruction. COMPARISON: MR head without and with contrast 01/07/2020. FINDINGS: BRAIN: No acute intraparenchymal hemorrhage. No mass lesion. No CT evidence for acute territorial infarct. No midline shift or extra-axial collection. Aspects: Ganglionic level: 7/7 Supraganglionic level:  3/3 Total: 10/10 VENTRICLES: No  hydrocephalus. ORBITS: The orbits are unremarkable. SINUSES AND MASTOIDS: The paranasal sinuses and mastoid air cells are clear. SOFT TISSUES: No significant facial or scalp soft tissue swelling evident. No radiopaque foreign body is seen. BONES: No acute skull fracture. IMPRESSION: 1. No acute intracranial abnormality. The pertinent results were texted to dr. Cleone Dad via the [facility] system at 10:41am. Electronically signed by: Audree Leas MD 10/17/2023 10:42 AM EDT RP Workstation: [id number]       HISTORY OF PRESENT ILLNESS 48 y.o. patient with hx of migraines never with focal neurological symptoms, bradycardia, asthma, hypertension, depression, overactive bladder and sleep apnea who presents with sudden onset right sided numbness and weakness of the right arm.   HOSPITAL COURSE  Stroke-like symptoms s/p TNK Functional neurologic disorder versus Symptomatic Hypotension/bradycardia  Code Stroke CT head - No acute abnormality.  CTA head & neck: Negative MRI: Negative 24hour post-TNK CT Head: Negative  2D Echo: EF 55 to 60%, severely dilated left atria, mild MVR, moderate TVR LDL 45 HgbA1c 5.5 VTE prophylaxis - SCDs, will start lovenox  today No antithrombotic prior  to admission, now on aspirin  81 mg daily Therapy recommendations: Outpatient PT Disposition:  Discharge home   Bradycardia History of palpitation States intermittent chest pain, relieved with ambulation Trop 4--4 BNP 362.3 CK 68, CK MB 1.5 EKG: Sinus Bradycardia Monitor has shown as low as 30s at night, usually high 40s/low 50s during the day.  Cardiology Dr. Berry Bristol on board, recommended change propanolol to 10 mg 3 times daily as needed   Angina Pectoris Patient continues to endorse States tingling in her right lips also- now resolved She has seen Dr. Berry Bristol in the past, these same symptoms noted.  Cardiac cath 05/2023 showed normal coronaries with normal micro circular function and catheter induced LAD spasm and  proximal LAD  Started on Imdur  30mg  daily at 06/2023 Cardiology OP visit, continued on Propanolol 20mg  TID for palpitations. -> Cards consult npatient- propanolol 10mg  TID PRN   Anxiety Home Meds: Xanax  0.5mg  TID PRN, Propanolol 20mg  BID, Trazadone 50mg  at bedtime PRN, Lamictal  25mg  BID.  Propanalol held due to bradycardia, Trazadone held. Lamictal  restarted. Patient continues to endorse stress   Hx of Hypotension Home meds:  losartan  100mg  Propanolol prescribed for palpitations and anxiety, now changed to as needed per cardiology Stable Long-term BP goal normotensive   Hyperlipidemia Home meds:  Crestor  10mg , Zetia  10mg   LDL 45, goal < 70 No high intensity statin given LDL at goal Continue home statin at discharge   Other Stroke Risk Factors Obesity, Body mass index is 41.13 kg/m., BMI >/= 30 associated with increased stroke risk, recommend weight loss, diet and exercise as appropriate  Family hx stroke (uncle) Migraines - topamax , continue   DISCHARGE EXAM  PHYSICAL EXAM General:  Alert, well-nourished, well-developed patient in no acute distress Psych:  Mood and affect appropriate for situation CV: Regular rate and rhythm on monitor Respiratory:  Regular, unlabored respirations on room air GI: Abdomen soft and nontender     NEURO:  Mental Status: AA&Ox3, patient is able to give clear and coherent history Speech/Language: speech is without dysarthria or aphasia.  Naming, repetition, fluency, and comprehension intact.   Cranial Nerves:  II: PERRL. Visual fields full.  III, IV, VI: EOMI. Eyelids elevate symmetrically.  V: Sensation is intact to light touch and symmetrical to face.  VII: Face is symmetrical resting and smiling VIII: hearing intact to voice. IX, X: Palate elevates symmetrically. Phonation is normal.  NG:[id number] shrug 5/5. XII: tongue is midline without fasciculations. Motor: 5/5 strength to all muscle groups tested.  Tone: is normal and bulk is  normal Sensation-sensation symmetric on face, arms, and legs Coordination: FTN intact bilaterally, HKS: no ataxia in BLE.No drift.  Gait- deferred  1a Level of Conscious.: 0 1b LOC Questions: 0 1c LOC Commands: 0 2 Best Gaze: 0 3 Visual: 0 4 Facial Palsy: 0 5a Motor Arm - left: 0 5b Motor Arm - Right: 0 6a Motor Leg - Left: 0 6b Motor Leg - Right: 0 7 Limb Ataxia: 0 8 Sensory: 0 9 Best Language: 0 10 Dysarthria: 0 11 Extinct. and Inatten.: 0 TOTAL: 0   Discharge Diet       Diet   Diet regular Room service appropriate? Yes; Fluid consistency: Thin   liquids  DISCHARGE PLAN Disposition: Home aspirin  81 mg daily for secondary stroke prevention  Ongoing stroke risk factor control by Primary Care Physician at time of discharge Outpatient PT in [place] ordered Follow-up PCP Barnetta Liberty, MD in 2 weeks.   35 minutes were spent preparing discharge.  Patient  seen and examined by NP/APP with MD. MD to update note as needed.   Imogene Mana, DNP, FNP-BC [facility] Pager: [phone]  ATTENDING NOTE: I reviewed above note and agree with the assessment and plan. Pt was seen and examined.   Husband at bedside.  Patient stated no neurosymptoms at this time, still has intermittent bradycardia and chest pain.  Cardiology Dr. Berry Bristol on board, no specific change management given symptoms chronic and has been noted in the past.  Change propranolol  20 mg 3 times daily to milligram as needed.  Medically stable for discharge.  Follow-up with neurology and cardiology as outpatient.  For detailed assessment and plan, please refer to above as I have made changes wherever appropriate.   Consuelo Denmark, MD PhD Stroke Neurology 10/20/2023 7:13 AM

## 2023-10-19 NOTE — Evaluation (Signed)
 Occupational Therapy Evaluation Patient Details Name: Mckenzie Park MRN: [id number] DOB: 1976/02/21 Today's Date: 10/19/2023   History of Present Illness   Pt is 48 yo female who presents on 10/17/23 with R sided facial numbness that progressed to RUE and RLE.  Received TNK 1050. Admitted for management of stroke vs complex migraines. MRI negative. Recently seen in ED 5/21 for pyelonephritis. PMH: HTN, HLD, heart murmur, kidney stone, GERD, anxiety, depression, and obesity (BMI 41).     Clinical Impressions PTA, pt lived with spouse and was independent in ADL and IADL. Upon eval, pt presents with decreased balance, safety, attention. Pt needing increased time to follow multistep commands, and up to supervision for ADL performance. Pt with recommendations from SLP for cognition and PT for balance on an OP basis. Do not suspect need for follow up OT in addition to these.    If plan is discharge home, recommend the following:   A little help with walking and/or transfers;A little help with bathing/dressing/bathroom;Assistance with cooking/housework;Assist for transportation;Help with stairs or ramp for entrance     Functional Status Assessment   Patient has had a recent decline in their functional status and demonstrates the ability to make significant improvements in function in a reasonable and predictable amount of time.     Equipment Recommendations   None recommended by OT     Recommendations for Other Services         Precautions/Restrictions   Precautions Precautions: Fall Recall of Precautions/Restrictions: Intact Precaution/Restrictions Comments: bradycardia Restrictions Weight Bearing Restrictions Per Provider Order: No     Mobility Bed Mobility Overal bed mobility: Needs Assistance Bed Mobility: Sit to Supine       Sit to supine: Supervision   General bed mobility comments: pt able to return to bed and reposition without assistance     Transfers Overall transfer level: Needs assistance Equipment used: None Transfers: Sit to/from Stand Sit to Stand: Supervision           General transfer comment: supervision for safety, no sway or LOB      Balance Overall balance assessment: Mild deficits observed, not formally tested                                         ADL either performed or assessed with clinical judgement   ADL Overall ADL's : Needs assistance/impaired Eating/Feeding: Independent   Grooming: Supervision/safety;Standing   Upper Body Bathing: Independent   Lower Body Bathing: Supervison/ safety;Sit to/from stand   Upper Body Dressing : Independent   Lower Body Dressing: Supervision/safety;Sit to/from stand   Toilet Transfer: Supervision/safety;Ambulation           Functional mobility during ADLs: Supervision/safety       Vision Baseline Vision/History: 1 Wears glasses Ability to See in Adequate Light: 0 Adequate Patient Visual Report: No change from baseline Vision Assessment?: No apparent visual deficits     Perception Perception: Within Functional Limits       Praxis Praxis: WFL       Pertinent Vitals/Pain Pain Assessment Pain Assessment: 0-10 Pain Score: 5  Pain Location: chest Pain Descriptors / Indicators: Sharp, Shooting, Heaviness Pain Intervention(s): Monitored during session, Limited activity within patient's tolerance     Extremity/Trunk Assessment Upper Extremity Assessment Upper Extremity Assessment: Overall WFL for tasks assessed;Right hand dominant   Lower Extremity Assessment Lower Extremity Assessment: Defer to PT evaluation   Cervical /  Trunk Assessment Cervical / Trunk Assessment: Normal   Communication Communication Communication: No apparent difficulties   Cognition Arousal: Alert Behavior During Therapy: WFL for tasks assessed/performed Cognition: No apparent impairments                                Following commands: Intact       Cueing  General Comments   Cueing Techniques: Verbal cues  poor endurance, poor sleep hygiene (pt had been working 3rd shift and sleeping very little) educated on role of sleep in recovery and importance for physical function.   Exercises     Shoulder Instructions      Home Living Family/patient expects to be discharged to:: Private residence Living Arrangements: Spouse/significant other;Children;Other relatives (spouse, daughter, grand daughter, mother in law) Available Help at Discharge: Family;Available 24 hours/day;Available PRN/intermittently Type of Home: House Home Access: Stairs to enter [employer] of Steps: 2 on side where pt enters , 6 in front and then 2 steps up Entrance Stairs-Rails:  (FRONT BIL RAILS) Home Layout: One level     Bathroom Shower/Tub: Tub/shower unit;Walk-in shower (uses tub shower)   Bathroom Toilet: Standard     Home Equipment: Hand held shower head;Grab bars - tub/shower      Lives With: Family;Spouse    Prior Functioning/Environment Prior Level of Function : Independent/Modified Independent;Driving             Mobility Comments: independent, reports chest tightness and sharp pain for 6 months with any exertion ADLs Comments: Independent, no AD, 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    OT Problem List: Decreased strength;Decreased activity tolerance;Impaired balance (sitting and/or standing)   OT Treatment/Interventions: Self-care/ADL training;Therapeutic exercise;DME and/or AE instruction;Therapeutic activities;Patient/family education;Balance training      OT Goals(Current goals can be found in the care plan section)   Acute Rehab OT Goals Patient Stated Goal: get better OT Goal Formulation: With patient Time For Goal Achievement: 11/02/23 Potential to Achieve Goals: Good   OT Frequency:  Min 1X/week    Co-evaluation              AM-PAC OT "6 Clicks" Daily  Activity     Outcome Measure Help from another person eating meals?: None Help from another person taking care of personal grooming?: A Little Help from another person toileting, which includes using toliet, bedpan, or urinal?: A Little Help from another person bathing (including washing, rinsing, drying)?: A Little Help from another person to put on and taking off regular upper body clothing?: None Help from another person to put on and taking off regular lower body clothing?: A Little 6 Click Score: 20   End of Session Equipment Utilized During Treatment: Gait belt Nurse Communication: Mobility status  Activity Tolerance: Patient tolerated treatment well Patient left: in chair;with call bell/phone within reach;with chair alarm set  OT Visit Diagnosis: Unsteadiness on feet (R26.81);Muscle weakness (generalized) (M62.81)                Time: 1610-9604 OT Time Calculation (min): 31 min Charges:  OT General Charges $OT Visit: 1 Visit OT Evaluation $OT Eval Moderate Complexity: 1 Mod OT Treatments $Self Care/Home Management : 8-22 mins  Karilyn Ouch, OTR/L [facility] Acute Rehabilitation Office: [phone]   Mckenzie Park 10/19/2023, 2:00 PM

## 2023-10-27 DIAGNOSIS — R2990 Unspecified symptoms and signs involving the nervous system: Secondary | ICD-10-CM | POA: Insufficient documentation

## 2023-11-23 ENCOUNTER — Ambulatory Visit: Admitting: Cardiology

## 2023-12-03 ENCOUNTER — Telehealth: Payer: Self-pay

## 2023-12-03 NOTE — Telephone Encounter (Addendum)
 Received CMN. Signed and faxed to [facility] at [phone]. Received fax confirmation.

## 2024-01-11 ENCOUNTER — Other Ambulatory Visit: Payer: Self-pay | Admitting: Cardiology

## 2024-01-14 MED ORDER — VERAPAMIL HCL ER 180 MG PO TBCR: 180.0000 mg | EXTENDED_RELEASE_TABLET | Freq: Every day | ORAL | 0 refills | Status: DC

## 2024-01-18 NOTE — ED Notes (Signed)
 This CNA tried to get EKG at 2035, pt in Imaging. Will obtain when pt is back in room.

## 2024-01-19 ENCOUNTER — Ambulatory Visit: Payer: Self-pay | Admitting: Pulmonary Disease

## 2024-01-19 ENCOUNTER — Encounter: Payer: Self-pay | Admitting: Pulmonary Disease

## 2024-01-19 ENCOUNTER — Ambulatory Visit (INDEPENDENT_AMBULATORY_CARE_PROVIDER_SITE_OTHER): Admitting: Pulmonary Disease

## 2024-01-19 VITALS — BP 146/82 | HR 51 | Temp 98.5°F | Ht 66.0 in | Wt 283.0 lb

## 2024-01-19 DIAGNOSIS — J4541 Moderate persistent asthma with (acute) exacerbation: Secondary | ICD-10-CM | POA: Diagnosis not present

## 2024-01-19 MED ORDER — MONTELUKAST SODIUM 10 MG PO TABS: 10.0000 mg | ORAL_TABLET | Freq: Every day | ORAL | 11 refills | Status: AC

## 2024-01-19 MED ORDER — PREDNISONE 10 MG PO TABS: 40.0000 mg | ORAL_TABLET | Freq: Every day | ORAL | 0 refills | Status: AC

## 2024-01-19 NOTE — Telephone Encounter (Signed)
 FYI Only or Action Required?: FYI only for provider.  Patient is followed in Pulmonology for asthma, last seen on 04/28/2023 by Kara Dorn NOVAK, MD.  Called Nurse Triage reporting Shortness of Breath.  Symptoms began yesterday.  Interventions attempted: Other: went to the ED.  Symptoms are: gradually worsening.  Triage Disposition: See HCP Within 4 Hours (Or PCP Triage)  Patient/caregiver understands and will follow disposition?: Yes        Copied from CRM [id number]. Topic: Clinical - Red Word Triage >> Jan 19, 2024 12:21 PM Shona RAMAN wrote: Red Word that prompted transfer to Nurse Triage: shortness of breath, right lung is inflamed, hospital discharge yesterday.          Reason for Disposition  [1] Longstanding difficulty breathing (e.g., CHF, COPD, emphysema) AND [2] WORSE than normal  Answer Assessment - Initial Assessment Questions 1. RESPIRATORY STATUS: Describe your breathing? (e.g., wheezing, shortness of breath, unable to speak, severe coughing)      Shortness of breath  2. ONSET: When did this breathing problem begin?      Yesterday  3. PATTERN Does the difficult breathing come and go, or has it been constant since it started?      Constant  4. SEVERITY: How bad is your breathing? (e.g., mild, moderate, severe)      Mild to moderate  5. RECURRENT SYMPTOM: Have you had difficulty breathing before? If Yes, ask: When was the last time? and What happened that time?      Yes 6. CARDIAC HISTORY: Do you have any history of heart disease? (e.g., heart attack, angina, bypass surgery, angioplasty)      Hypertension  7. LUNG HISTORY: Do you have any history of lung disease?  (e.g., pulmonary embolus, asthma, emphysema)     Asthma  9. OTHER SYMPTOMS: Do you have any other symptoms? (e.g., chest pain, cough, dizziness, fever, runny nose)     Coughed up blood yesterday prior to going to the ED  Protocols used: Breathing Difficulty-A-AH

## 2024-01-19 NOTE — Progress Notes (Signed)
 Mckenzie Park    [id number]    [birth year]  Primary Care Physician:Larnell Hamilton, MD  Referring Physician: Larnell Hamilton, MD [address] [place],  [place] [address]  Chief complaint:    HPI: 48 y.o. who  has a past medical history of Anxiety, Depression, GERD (gastroesophageal reflux disease), Heart murmur, Hyperlipidemia, Hypertension, and Kidney stone.  Discussed the use of AI scribe software for clinical note transcription with the patient, who gave verbal consent to proceed.  History of Present Illness Mckenzie Park is a 48 year old female with asthma who presents with shortness of breath and hemoptysis. She is accompanied by her husband.  Hemoptysis - Hemoptysis occurred at 3:30 AM the previous day, with spitting out blood from the mouth - No visible bleeding in the mouth, gums, or nose - Initially mistaken for drool from CPAP machine, but identified as blood after expectorating a large, squishy chunk - Emergency room evaluation included chest x-ray and CT scan which did not show any significant lung abnormality  Dyspnea and chest tightness - Significant shortness of breath, becoming winded after walking 50 feet - Difficulty talking due to breathlessness - Shortness of breath has persisted for several months - Chest tightness present for three to four months - Increased breathlessness observed by her husband - Uses Breztri  inhaler twice daily and albuterol  inhaler as needed (not used frequently) - Uses CPAP machine for sleep apnea  Urinary tract infection and flank pain - Diagnosed with urinary tract infection - Started on cephalexin, first dose at noon on day of visit  Bradycardia and palpitations - History of low heart rate, with recent episode of heart rate of 42 while at work - Monitors heart rate with a watch, with readings dropping to the 40s at times - Takes propranolol  and verapamil  as needed for palpitations - Confusion regarding instructions for  propranolol  and verapamil   Neurological symptoms - Hospitalized in May for stroke-like symptoms - Received fibrinolytics during hospitalization  Outpatient Encounter Medications as of 01/19/2024  Medication Sig   albuterol  (VENTOLIN  HFA) 108 (90 Base) MCG/ACT inhaler Inhale 2 puffs into the lungs every 6 (six) hours as needed for wheezing or shortness of breath.   ALPRAZolam  (XANAX ) 0.5 MG tablet Take 0.5 mg by mouth 2 (two) times daily.   aspirin  EC 81 MG tablet Take 1 tablet (81 mg total) by mouth daily. Swallow whole.   BREZTRI  AEROSPHERE 160-9-4.8 MCG/ACT AERO inhaler INHALE 2 PUFFS IN THE MORNING AND AT BEDTIME   cephALEXin (KEFLEX) 500 MG capsule Take 500 mg by mouth 2 (two) times daily.   cyclobenzaprine (FLEXERIL) 5 MG tablet Take 5 mg by mouth 3 (three) times daily as needed for muscle spasms.   escitalopram  (LEXAPRO ) 20 MG tablet Take 20 mg by mouth daily.   ezetimibe  (ZETIA ) 10 MG tablet TAKE 1 TABLET BY MOUTH ONCE DAILY IN THE EVENING   isosorbide  mononitrate (IMDUR ) 30 MG 24 hr tablet Take 1 tablet (30 mg total) by mouth daily.   lamoTRIgine  (LAMICTAL ) 25 MG tablet Take 25 mg by mouth 2 (two) times daily.   losartan  (COZAAR ) 100 MG tablet Take 1 tablet by mouth in the evening   Lurasidone HCl 60 MG TABS Take 60 mg by mouth at bedtime.   nitroGLYCERIN  (NITROSTAT ) 0.4 MG SL tablet Place 1 tablet (0.4 mg total) under the tongue every 5 (five) minutes as needed for up to 25 days for chest pain.   ondansetron  (ZOFRAN -ODT) 4 MG disintegrating  tablet Take 4 mg by mouth every 8 (eight) hours as needed for nausea or vomiting.   propranolol  (INDERAL ) 10 MG tablet Take 1 tablet (10 mg total) by mouth 3 (three) times daily as needed (palpations).   rosuvastatin  (CRESTOR ) 10 MG tablet Take 1 tablet (10 mg total) by mouth daily.   topiramate  (TOPAMAX ) 25 MG capsule Take 25 mg by mouth daily.   traZODone (DESYREL) 50 MG tablet Take 1-2 tablets by mouth at bedtime as needed for sleep.    verapamil  (CALAN -SR) 180 MG CR tablet Take 1 tablet (180 mg total) by mouth at bedtime.   Cholecalciferol  (D-3-5) 125 MCG (5000 UT) capsule Take 5,000 Units by mouth daily. (Patient not taking: Reported on 01/19/2024)   HYDROcodone-acetaminophen  (NORCO/VICODIN) 5-325 MG tablet Take 1-2 tablets by mouth every 6 (six) hours as needed for moderate pain (pain score 4-6) or severe pain (pain score 7-10). (Patient not taking: Reported on 01/19/2024)   No facility-administered encounter medications on file as of 01/19/2024.    Physical Exam: Today's Vitals   01/19/24 1514  BP: (!) 160/85  Pulse: (!) 51  Temp: 98.5 F (36.9 C)  TempSrc: Oral  SpO2: 99%  Weight: 283 lb (128.4 kg)  Height: 5' 6 (1.676 m)   Body mass index is 45.68 kg/m.  Physical Exam GEN: No acute distress. CV: Regular rate and rhythm, no murmurs. LUNGS: Clear to auscultation bilaterally, normal respiratory effort. SKIN JOINTS: Warm and dry, no rash.    Data Reviewed: Imaging: CT abdomen pelvis 01/19/2024- Right nephrolithiasis without hydronephrosis.   CTA chest 01/18/2024 1.  No definite pulmonary embolus.  2.  Cardiomegaly.  3.  Mild coronary artery atherosclerosis suggested (LAD).  4.  Mild mosaic attenuation of the lungs may reflect small airways disease. No focal infiltrate/consolidative changes.   PFTs:  Labs:  Assessment & Plan Asthma with poor symptom control Minor hemoptysis Asthma symptoms have been poorly controlled for several months, with increased dyspnea, chest tightness, and wheezing, exacerbated by physical activity and speaking. Current inhaler regimen includes Breztri  and albuterol , with increased use of Breztri . CT scan shows no lung inflammation but average changes consistent with asthma. Differential includes potential cardiac issues due to bradycardia and medication effects. Prednisone  discussed as a short-term option with potential weight gain as a side effect. Singulair  proposed as an  alternative to manage inflammation.  Hemoptysis appears to be self resolved.  CT scan reviewed with no significant lung abnormalities or source of bleeding.  - Start Singulair  for asthma control. - Prescribe prednisone  for short-term use if symptoms do not improve with Singulair . - Monitor for recurrence of hemoptysis  Urinary tract infection Diagnosed with a urinary tract infection and started on cephalexin. Symptoms include left flank pain. Treatment initiated in the emergency department. - Continue cephalexin for 5 days as prescribed.  Bradycardia Bradycardia with heart rate dropping to the 40s. Symptoms include dyspnea and fatigue. Bradycardia may be related to propranolol  and verapamil  use. Cardiologist follow-up is scheduled to evaluate medication effects and potential cardiac causes. Discussion with cardiologist suggested to assess the need for propranolol  and verapamil , as these may contribute to low heart rate and associated symptoms. - Follow up with cardiologist to evaluate bradycardia and medication effects.  Recommendations: Continue Breztri  Stop Singulair , prednisone  40 mg a day for 5 days Finish cephalexin for UTI Follow-up with cardiology.  Lonna Coder MD [facility] 01/19/2024, 3:39 PM  CC: Larnell Hamilton, MD

## 2024-01-19 NOTE — Patient Instructions (Signed)
  VISIT SUMMARY: During your visit, we discussed your recent symptoms of shortness of breath, hemoptysis (coughing up blood), and chest tightness. We also reviewed your urinary tract infection, kidney stone, and episodes of low heart rate. We have made some adjustments to your asthma treatment and provided guidance on managing your other conditions.  YOUR PLAN: -ASTHMA WITH POOR SYMPTOM CONTROL: Your asthma symptoms have been worsening, causing increased shortness of breath and chest tightness. We are starting you on Singulair  to help control the inflammation in your airways. If your symptoms do not improve with Singulair , we will prescribe prednisone  for short-term use. Please follow up with your cardiologist to rule out any heart-related causes of your symptoms.  -URINARY TRACT INFECTION: You have a urinary tract infection, which is being treated with cephalexin. Please continue taking the cephalexin for 5 days as prescribed.  -NEPHROLITHIASIS WITH LEFT-SIDED HYDRONEPHROSIS: You have a kidney stone on the left side causing mild blockage and inflammation, which is contributing to your left flank pain. We will monitor this condition closely.  -BRADYCARDIA: Your heart rate has been dropping to the 40s, which may be related to your use of propranolol  and verapamil . Please follow up with your cardiologist to evaluate the effects of these medications and to rule out any other potential heart issues.  INSTRUCTIONS: Please follow up with your cardiologist to evaluate your bradycardia and the effects of your medications. Continue taking cephalexin for your urinary tract infection as prescribed. If your asthma symptoms do not improve with Singulair , we will consider prescribing prednisone  for short-term use.

## 2024-01-19 NOTE — Telephone Encounter (Signed)
 Pt is scheduled with Dr Theophilus today. NFN

## 2024-01-20 ENCOUNTER — Encounter: Payer: Self-pay | Admitting: Physician Assistant

## 2024-01-20 ENCOUNTER — Ambulatory Visit (INDEPENDENT_AMBULATORY_CARE_PROVIDER_SITE_OTHER)

## 2024-01-20 ENCOUNTER — Ambulatory Visit: Attending: Cardiovascular Disease | Admitting: Physician Assistant

## 2024-01-20 VITALS — BP 120/78 | HR 41 | Ht 66.0 in | Wt 263.8 lb

## 2024-01-20 DIAGNOSIS — E7800 Pure hypercholesterolemia, unspecified: Secondary | ICD-10-CM

## 2024-01-20 DIAGNOSIS — R0609 Other forms of dyspnea: Secondary | ICD-10-CM

## 2024-01-20 DIAGNOSIS — F411 Generalized anxiety disorder: Secondary | ICD-10-CM

## 2024-01-20 DIAGNOSIS — R072 Precordial pain: Secondary | ICD-10-CM

## 2024-01-20 DIAGNOSIS — I10 Essential (primary) hypertension: Secondary | ICD-10-CM

## 2024-01-20 DIAGNOSIS — R001 Bradycardia, unspecified: Secondary | ICD-10-CM

## 2024-01-20 DIAGNOSIS — R55 Syncope and collapse: Secondary | ICD-10-CM

## 2024-01-20 DIAGNOSIS — R002 Palpitations: Secondary | ICD-10-CM

## 2024-01-20 NOTE — Progress Notes (Signed)
 Cardiology Office Note   Date:  01/20/2024  ID:  Rhyann Berton, DOB [birth year], MRN [id number] PCP: Larnell Hamilton, MD   HeartCare Providers Cardiologist:  Gordy Bergamo, MD   History of Present Illness Thanya Cegielski is a 48 y.o. female with a past medical history of HTN, HLD, morbid obesity, family history of premature CAD, generalized anxiety disorder, fibromyalgia, and palpitations here for hospital follow-up.  History includes initially being seen by Dr. Bergamo in 2020 for complaint of chest pain and palpitations.  She completed previous treadmill stress test in 2019 that showed excellent exercise tolerance and no evidence of ischemia.  2D echo was also completed in 2016 which showed mild to moderate MR and TR.  Continue to have complaint of chest pain with dyspnea palpitations underwent coronary CTA which showed calcium  score of 0 and no significant aortic abnormalities.  Underwent TTE on 08/2022 and showed normal EF of 66 5% with no RWMA and trace TVR.  Was seen in follow-up January 2025 and had lost a significant amount of weight approximately 20 pounds describes shortness of breath with chest pressure and tingling of both hands, mouth, and feet.  Also endorsed palpitations.  She is extremely anxious and underwent LHC for further evaluation that showed normal coronaries with normal microvascular function and catheter induced LAD spasm and proximal LAD.  She was seen in the office following her heart catheterization.  During that visit her heart cath results were reviewed.  She continues to experience chest pressure and tingling in her hands and feet.  Reported stress due to recent family loss and her work schedule which may be contributing.  Also been using nitroglycerin  at work when she experiences chest tightness and SOB.  She reports pain in her arm which she describes a feeling like holding a box for a long time.  She has a history of fibromyalgia and is experiencing symptoms that  suggest potential neuropathy including burning and pins-and-needles in her feet.  She was seen in the ER back in May for focal neurologic symptoms and sudden onset of right-sided numbness and weakness in the right arm.  She had a head CT with no acute abnormality.  Her CT of the head and neck was negative.  MRI was negative.  Echocardiogram showed LVEF 55 to 60%, severely dilated left atria, mild MR and moderate TR.  Aspirin  81 mg was initiated and she was discharged home with outpatient PT.  Today, she presents today with hx of chest pain and shortness of breath occur with minimal exertion, such as walking 50 feet or talking. She recently experienced hemoptysis, spitting up a significant amount of blood with an unidentified object in her mouth, leading to an emergency room visit.  Bradycardia is present, with a heart rate of 43 beats per minute during her last ER visit and 41 beats per minute today. She experiences lightheadedness and dizziness, particularly with quick movements, but no syncope.  A cardiac catheterization was performed in January. A recent CT scan shows calcifications in her coronary arteries, whereas her coronary calcium  score was zero in 2021.  Her family history is significant for heart disease, with both parents having been patients of her current cardiologist. She takes aspirin  81 mg and is not on other blood thinners.  Reports no chest pain, pressure, or tightness. No edema, orthopnea, PND. Reports no palpitations.   Discussed the use of AI scribe software for clinical note transcription with the patient, who gave verbal consent to proceed.  ROS: pertinent  ROS in HPI  Studies Reviewed     Echo 10/17/23 IMPRESSIONS     1. Left ventricular ejection fraction, by estimation, is 55 to 60%. The  left ventricle has normal function. The left ventricle has no regional  wall motion abnormalities. The left ventricular internal cavity size was  mildly dilated. Left ventricular   diastolic parameters were normal.   2. Right ventricular systolic function is normal. The right ventricular  size is normal.   3. Left atrial size was severely dilated.   4. The mitral valve is normal in structure. Mild mitral valve  regurgitation. No evidence of mitral stenosis.   5. Tricuspid valve regurgitation is moderate.   6. The aortic valve is tricuspid. Aortic valve regurgitation is not  visualized. No aortic stenosis is present.   7. The inferior vena cava is dilated in size with >50% respiratory  variability, suggesting right atrial pressure of 8 mmHg.   Comparison(s): No prior Echocardiogram.       Physical Exam VS:  BP 120/78   Pulse (!) 41   Ht 5' 6 (1.676 m)   Wt 263 lb 12.8 oz (119.7 kg)   SpO2 96%   BMI 42.58 kg/m        Wt Readings from Last 3 Encounters:  01/20/24 263 lb 12.8 oz (119.7 kg)  01/19/24 283 lb (128.4 kg)  10/17/23 247 lb 2.2 oz (112.1 kg)    GEN: Well nourished, well developed in no acute distress NECK: No JVD; No carotid bruits CARDIAC: sinus bradycardia, no murmurs, rubs, gallops RESPIRATORY:  Clear to auscultation without rales, wheezing or rhonchi  ABDOMEN: Soft, non-tender, non-distended EXTREMITIES:  No edema; No deformity   ASSESSMENT AND PLAN  Bradycardia with symptomatic shortness of breath and presyncope Bradycardia with heart rate of 41 bpm causing shortness of breath and presyncope. No syncope history. No prior heart monitor use. - Place 14-day Zio monitor to evaluate heart rhythm and correlate with symptoms. - Schedule follow-up with Dr. Ladona to review monitor results and discuss further management.  Hemoptysis of unclear etiology Hemoptysis with significant blood expectoration, no clear source identified. Differential includes pulmonary, gastrointestinal, or ENT sources. - Refer to ENT for evaluation of potential bleeding sources. - Consider endoscopy for gastrointestinal sources if ENT evaluation is  inconclusive.  Mild coronary artery disease with coronary calcification Mild coronary artery disease with calcification on CT. Previous cardiac catheterization showed no significant disease. Coronary CT angiography not necessary due to recent catheterization and lack of significant findings.  Mitral and tricuspid valve regurgitation (mild mitral, moderate tricuspid) Mild mitral and moderate tricuspid valve regurgitation noted on echocardiogram. Repeat echocardiogram considered due to symptom progression. - Schedule repeat echocardiogram to assess valve function changes and correlate with symptoms.     Dispo: She can follow-up with Dr. Ladona or APP in 2 to 3 months  Signed, Orren LOISE Fabry, PA-C

## 2024-01-20 NOTE — Progress Notes (Unsigned)
 Applied a 14 day Zio XT monitor to patient in the office  Ganji to read

## 2024-01-20 NOTE — Patient Instructions (Signed)
 Medication Instructions:  Your physician has recommended you make the following change in your medication:  HOLD PROPRANOLOL  UNTIL YOU GET YOUR MONITOR RESULTS BACK   *If you need a refill on your cardiac medications before your next appointment, please call your pharmacy*  Lab Work: NONE If you have labs (blood work) drawn today and your tests are completely normal, you will receive your results only by: MyChart Message (if you have MyChart) OR A paper copy in the mail If you have any lab test that is abnormal or we need to change your treatment, we will call you to review the results.  Testing/Procedures: Your physician has requested that you have an echocardiogram. Echocardiography is a painless test that uses sound waves to create images of your heart. It provides your doctor with information about the size and shape of your heart and how well your heart's chambers and valves are working. This procedure takes approximately one hour. There are no restrictions for this procedure. Please do NOT wear cologne, perfume, aftershave, or lotions (deodorant is allowed). Please arrive 15 minutes prior to your appointment time.  Please note: We ask at that you not bring children with you during ultrasound (echo/ vascular) testing. Due to room size and safety concerns, children are not allowed in the ultrasound rooms during exams. Our front office staff cannot provide observation of children in our lobby area while testing is being conducted. An adult accompanying a patient to their appointment will only be allowed in the ultrasound room at the discretion of the ultrasound technician under special circumstances. We apologize for any inconvenience.   ZIO XT- Long Term Monitor Instructions  Your physician has requested you wear a ZIO patch monitor for 14 days.  This is a single patch monitor. [employer] supplies one patch monitor per enrollment. Additional stickers are not available. Please do not apply  patch if you will be having a Nuclear Stress Test,  Echocardiogram, Cardiac CT, MRI, or Chest Xray during the period you would be wearing the  monitor. The patch cannot be worn during these tests. You cannot remove and re-apply the  ZIO XT patch monitor.  Your ZIO patch monitor will be mailed 3 day USPS to your address on file. It may take 3-5 days  to receive your monitor after you have been enrolled.  Once you have received your monitor, please review the enclosed instructions. Your monitor  has already been registered assigning a specific monitor serial # to you.  Billing and Patient Assistance Program Information  We have supplied [employer] with any of your insurance information on file for billing purposes. [employer] offers a sliding scale Patient Assistance Program for patients that do not have  insurance, or whose insurance does not completely cover the cost of the ZIO monitor.  You must apply for the Patient Assistance Program to qualify for this discounted rate.  To apply, please call [employer] at [phone], select option 4, select option 2, ask to apply for  Patient Assistance Program. Meredeth will ask your household income, and how many people  are in your household. They will quote your out-of-pocket cost based on that information.  [employer] will also be able to set up a 20-month, interest-free payment plan if needed.  Applying the monitor   Shave hair from upper left chest.  Hold abrader disc by orange tab. Rub abrader in 40 strokes over the upper left chest as  indicated in your monitor instructions.  Clean area with 4 enclosed alcohol  pads. Let  dry.  Apply patch as indicated in monitor instructions. Patch will be placed under collarbone on left  side of chest with arrow pointing upward.  Rub patch adhesive wings for 2 minutes. Remove white label marked 1. Remove the white  label marked 2. Rub patch adhesive wings for 2 additional minutes.  While looking in a mirror, press  and release button in center of patch. A small green light will  flash 3-4 times. This will be your only indicator that the monitor has been turned on.  Do not shower for the first 24 hours. You may shower after the first 24 hours.  Press the button if you feel a symptom. You will hear a small click. Record Date, Time and  Symptom in the Patient Logbook.  When you are ready to remove the patch, follow instructions on the last 2 pages of Patient  Logbook. Stick patch monitor onto the last page of Patient Logbook.  Place Patient Logbook in the blue and white box. Use locking tab on box and tape box closed  securely. The blue and white box has prepaid postage on it. Please place it in the mailbox as  soon as possible. Your physician should have your test results approximately 7 days after the  monitor has been mailed back to [facility].  Call [facility] Customer Care at [phone] if you have questions regarding  your ZIO XT patch monitor. Call them immediately if you see an orange light blinking on your  monitor.  If your monitor falls off in less than 4 days, contact our Monitor department at [phone].  If your monitor becomes loose or falls off after 4 days call [employer] at [phone] for  suggestions on securing your monitor   Follow-Up: At [facility], you and your health needs are our priority.  As part of our continuing mission to provide you with exceptional heart care, our providers are all part of one team.  This team includes your primary Cardiologist (physician) and Advanced Practice Providers or APPs (Physician Assistants and Nurse Practitioners) who all work together to provide you with the care you need, when you need it.  Your next appointment:   2-3 month(s)  Provider:   Gordy Bergamo, MD or APP  We recommend signing up for the patient portal called MyChart.  Sign up information is provided on this After Visit Summary.  MyChart is used to connect  with patients for Virtual Visits (Telemedicine).  Patients are able to view lab/test results, encounter notes, upcoming appointments, etc.  Non-urgent messages can be sent to your provider as well.   To learn more about what you can do with MyChart, go to [email or web address].

## 2024-02-10 DIAGNOSIS — R001 Bradycardia, unspecified: Secondary | ICD-10-CM

## 2024-02-10 DIAGNOSIS — R55 Syncope and collapse: Secondary | ICD-10-CM

## 2024-02-11 ENCOUNTER — Ambulatory Visit: Payer: Self-pay | Admitting: Physician Assistant

## 2024-02-26 ENCOUNTER — Ambulatory Visit (HOSPITAL_COMMUNITY)
Admission: RE | Admit: 2024-02-26 | Discharge: 2024-02-26 | Disposition: A | Discharge status: Home / Self Care | Source: Ambulatory Visit | Attending: Physician Assistant | Admitting: Physician Assistant

## 2024-02-26 DIAGNOSIS — R0609 Other forms of dyspnea: Secondary | ICD-10-CM | POA: Insufficient documentation

## 2024-02-26 LAB — ECHOCARDIOGRAM COMPLETE
Area-P 1/2: 3.2 cm2
S' Lateral: 3.1 cm

## 2024-02-26 MED ORDER — PERFLUTREN LIPID MICROSPHERE
1.0000 mL | INTRAVENOUS | Status: AC | PRN
  Administered 2024-02-26: 2 mL via INTRAVENOUS

## 2024-04-13 ENCOUNTER — Other Ambulatory Visit: Payer: Self-pay | Admitting: Cardiology

## 2024-04-13 DIAGNOSIS — R002 Palpitations: Secondary | ICD-10-CM

## 2024-04-13 DIAGNOSIS — F411 Generalized anxiety disorder: Secondary | ICD-10-CM

## 2024-04-13 DIAGNOSIS — I10 Essential (primary) hypertension: Secondary | ICD-10-CM

## 2024-04-15 ENCOUNTER — Telehealth: Payer: Self-pay | Admitting: Cardiology

## 2024-04-15 NOTE — Telephone Encounter (Signed)
 Per request from Dr. Daryle office, called patient directly. Call to # listed on DPR, received message that call could not be completed at this time. Unable to leave VM. Called local # on file, no answer.  Left VM asking recipient to call [place] at our office #.

## 2024-04-15 NOTE — Telephone Encounter (Signed)
 Caller Oscar) called to report the following from Dr. Larnell:  Dr. Larnell stated patient is unsure when she is supposed to take propranolol  (INDERAL ) 10 MG tablet (Expired) and wants a call back directly to the patient to explain medication instructions.

## 2024-04-20 ENCOUNTER — Encounter: Payer: Self-pay | Admitting: Pulmonary Disease

## 2024-04-20 ENCOUNTER — Telehealth: Payer: Self-pay

## 2024-04-20 ENCOUNTER — Ambulatory Visit (INDEPENDENT_AMBULATORY_CARE_PROVIDER_SITE_OTHER): Admitting: Pulmonary Disease

## 2024-04-20 VITALS — BP 118/62 | HR 47 | Ht 65.5 in | Wt 275.0 lb

## 2024-04-20 DIAGNOSIS — Z6841 Body Mass Index (BMI) 40.0 and over, adult: Secondary | ICD-10-CM

## 2024-04-20 DIAGNOSIS — J4540 Moderate persistent asthma, uncomplicated: Secondary | ICD-10-CM | POA: Diagnosis not present

## 2024-04-20 DIAGNOSIS — G4733 Obstructive sleep apnea (adult) (pediatric): Secondary | ICD-10-CM

## 2024-04-20 MED ORDER — ALBUTEROL SULFATE HFA 108 (90 BASE) MCG/ACT IN AERS: 2.0000 | INHALATION_SPRAY | Freq: Four times a day (QID) | RESPIRATORY_TRACT | 11 refills | Status: AC | PRN

## 2024-04-20 MED ORDER — BREZTRI AEROSPHERE 160-9-4.8 MCG/ACT IN AERO: 2.0000 | INHALATION_SPRAY | Freq: Two times a day (BID) | RESPIRATORY_TRACT | 0 refills | Status: DC

## 2024-04-20 NOTE — Progress Notes (Signed)
 Established Patient Pulmonology Office Visit   Subjective:  Patient ID: Mckenzie Park, female    DOB: December 12, 1975  MRN: [id number]  CC:  Chief Complaint  Patient presents with   Medical Management of Chronic Issues    Pt states tired while walking, SOB, dry cough still .    Discussed the use of AI scribe software for clinical note transcription with the patient, who gave verbal consent to proceed.  History of Present Illness Mckenzie Park is a 48 year old female with asthma who presents for asthma follow-up.  She has ongoing exertional dyspnea and feels exhausted after walking 25 to 30 feet despite using Breztri  two puffs twice daily, Singulair  at bedtime, and albuterol  as needed. She sometimes takes extra puffs of Breztri  for relief without clear benefit.  She was last seen on January 19, 2024 for an asthma exacerbation and was started on Singulair  and a prednisone  taper at that time. A prior CT scan showed no significant lung abnormalities.  She has a dry cough that becomes persistent once it starts, without mucus. She uses CPAP for sleep apnea without issues.  She also notes leg swelling and chest pressure with exertion.        Review of Systems  Constitutional:  Negative for chills, fever and weight loss.  Respiratory:  Positive for shortness of breath and wheezing.   Cardiovascular:  Positive for leg swelling.       Chest pressure, intermittent      Current Outpatient Medications:    ALPRAZolam  (XANAX ) 0.5 MG tablet, Take 0.5 mg by mouth 2 (two) times daily., Disp: , Rfl:    aspirin  EC 81 MG tablet, Take 1 tablet (81 mg total) by mouth daily. Swallow whole., Disp: 30 tablet, Rfl: 12   cephALEXin (KEFLEX) 500 MG capsule, Take 500 mg by mouth 2 (two) times daily., Disp: , Rfl:    Cholecalciferol  (D-3-5) 125 MCG (5000 UT) capsule, Take 5,000 Units by mouth daily., Disp: , Rfl:    cyclobenzaprine (FLEXERIL) 5 MG tablet, Take 5 mg by mouth 3 (three) times daily as needed  for muscle spasms., Disp: , Rfl:    escitalopram  (LEXAPRO ) 20 MG tablet, Take 20 mg by mouth daily., Disp: , Rfl:    ezetimibe  (ZETIA ) 10 MG tablet, TAKE 1 TABLET BY MOUTH ONCE DAILY IN THE EVENING, Disp: 90 tablet, Rfl: 3   HYDROcodone-acetaminophen  (NORCO/VICODIN) 5-325 MG tablet, Take 1-2 tablets by mouth every 6 (six) hours as needed for moderate pain (pain score 4-6) or severe pain (pain score 7-10)., Disp: , Rfl:    isosorbide  mononitrate (IMDUR ) 30 MG 24 hr tablet, Take 1 tablet (30 mg total) by mouth daily., Disp: 30 tablet, Rfl: 11   lamoTRIgine  (LAMICTAL ) 25 MG tablet, Take 25 mg by mouth 2 (two) times daily., Disp: , Rfl:    losartan  (COZAAR ) 100 MG tablet, Take 1 tablet by mouth in the evening, Disp: 90 tablet, Rfl: 2   Lurasidone HCl 60 MG TABS, Take 60 mg by mouth at bedtime., Disp: , Rfl:    montelukast  (SINGULAIR ) 10 MG tablet, Take 1 tablet (10 mg total) by mouth at bedtime., Disp: 30 tablet, Rfl: 11   nitroGLYCERIN  (NITROSTAT ) 0.4 MG SL tablet, Place 1 tablet (0.4 mg total) under the tongue every 5 (five) minutes as needed for up to 25 days for chest pain., Disp: 25 tablet, Rfl: 3   ondansetron  (ZOFRAN -ODT) 4 MG disintegrating tablet, Take 4 mg by mouth every 8 (eight) hours as needed for  nausea or vomiting., Disp: , Rfl:    propranolol  (INDERAL ) 10 MG tablet, Take 1 tablet (10 mg total) by mouth 3 (three) times daily as needed (palpations)., Disp: 90 tablet, Rfl: 0   rosuvastatin  (CRESTOR ) 10 MG tablet, Take 1 tablet (10 mg total) by mouth daily., Disp: 30 tablet, Rfl: 0   topiramate  (TOPAMAX ) 25 MG capsule, Take 25 mg by mouth daily., Disp: , Rfl:    traZODone (DESYREL) 50 MG tablet, Take 1-2 tablets by mouth at bedtime as needed for sleep., Disp: , Rfl:    verapamil  (CALAN -SR) 180 MG CR tablet, Take 1 tablet (180 mg total) by mouth at bedtime., Disp: 90 tablet, Rfl: 0   albuterol  (VENTOLIN  HFA) 108 (90 Base) MCG/ACT inhaler, Inhale 2 puffs into the lungs every 6 (six) hours as  needed for wheezing or shortness of breath., Disp: 8 g, Rfl: 11   budesonide -glycopyrrolate -formoterol  (BREZTRI  AEROSPHERE) 160-9-4.8 MCG/ACT AERO inhaler, Inhale 2 puffs into the lungs 2 (two) times daily., Disp: 11 g, Rfl: 0      Objective:  BP 118/62   Pulse (!) 47   Ht 5' 5.5 (1.664 m) Comment: per pt  Wt 275 lb (124.7 kg)   SpO2 98%   BMI 45.07 kg/m   Wt Readings from Last 3 Encounters:  04/20/24 275 lb (124.7 kg)  01/20/24 263 lb 12.8 oz (119.7 kg)  01/19/24 283 lb (128.4 kg)    Physical Exam Constitutional:      General: She is not in acute distress.    Appearance: Normal appearance. She is obese.  Eyes:     General: No scleral icterus.    Conjunctiva/sclera: Conjunctivae normal.  Cardiovascular:     Rate and Rhythm: Normal rate and regular rhythm.  Pulmonary:     Breath sounds: No wheezing, rhonchi or rales.  Musculoskeletal:     Right lower leg: No edema.     Left lower leg: No edema.  Skin:    General: Skin is warm and dry.  Neurological:     General: No focal deficit present.      Diagnostic Review:  Last CBC Lab Results  Component Value Date   WBC 5.3 10/18/2023   HGB 11.7 (L) 10/18/2023   HCT 35.6 (L) 10/18/2023   MCV 89.4 10/18/2023   MCH 29.4 10/18/2023   RDW 14.0 10/18/2023   PLT 256 10/18/2023   Last metabolic panel Lab Results  Component Value Date   GLUCOSE 97 10/18/2023   NA 140 10/18/2023   K 4.3 10/18/2023   CL 109 10/18/2023   CO2 25 10/18/2023   BUN 9 10/18/2023   CREATININE 0.91 10/18/2023   GFRNONAA >60 10/18/2023   CALCIUM  8.5 (L) 10/18/2023   PROT 6.2 (L) 10/18/2023   ALBUMIN 3.2 (L) 10/18/2023   BILITOT 0.5 10/18/2023   ALKPHOS 68 10/18/2023   AST 30 10/18/2023   ALT 28 10/18/2023   ANIONGAP 6 10/18/2023       Assessment & Plan:   Assessment & Plan Moderate persistent asthma without complication  Orders:   budesonide -glycopyrrolate -formoterol  (BREZTRI  AEROSPHERE) 160-9-4.8 MCG/ACT AERO inhaler; Inhale 2  puffs into the lungs 2 (two) times daily.   albuterol  (VENTOLIN  HFA) 108 (90 Base) MCG/ACT inhaler; Inhale 2 puffs into the lungs every 6 (six) hours as needed for wheezing or shortness of breath.  OSA on CPAP     BMI 45.0-49.9, adult (HCC)  Orders:   Amb Ref to Medical Weight Management   Assessment and Plan Assessment & Plan  Moderate persistent asthma Asthma symptoms persistent despite ICS/LABA/LAMA therapy and singulair .  - Continue Breztri  2 puffs twice daily. - Continue Singulair  at bedtime. - Continue albuterol  as needed. - Initiate Dupixent therapy and coordinated with pharmacy for approval. - Refilled Breztri  and albuterol  prescriptions.  Suspected pulmonary hypertension and heart failure with lower extremity edema Shortness of breath on exertion possibly due to pulmonary hypertension and heart failure. Echocardiogram shows normal left ventricular function, elevated pulmonary artery pressure, and atrial dilation. Differential includes diastolic heart failure. - Will review case with the cardiology team - Consider right heart catheterization for pulmonary hypertension evaluation. - Consider starting Lasix for fluid management. - Monitor weight and fluid retention.  Obstructive sleep apnea Managed with CPAP. - Download CPAP data for review.  Morbid obesity (BMI 45.0-49.9) Recent weight gain despite dietary modifications. Possible fluid retention contributing. Discussed weight loss benefits on asthma and sleep apnea. - Referred to healthy weight loss management clinic.       Return in about 3 months (around 07/21/2024) for f/u visit Dr. Kara.   Dorn KATHEE Kara, MD

## 2024-04-20 NOTE — Addendum Note (Signed)
 Addended by: Lakeem Rozo on: 04/20/2024 11:45 AM   Modules accepted: Orders

## 2024-04-20 NOTE — Patient Instructions (Signed)
 Continue breztri  2 puffs twice daily - rinse mouth out after each use  Continue albuterol  inhaler 1-2 puffs every 4-6 hours as needed  Continue CPAP at night  We will work on getting you started on dupixent for your asthma  I have messaged Dr. Annemarie about considering you for right heart cath to evaluate for pulmonary hypertension   We will refer you to the weight loss clinic  Follow up in 3 months

## 2024-04-20 NOTE — Assessment & Plan Note (Addendum)
  Orders:   budesonide -glycopyrrolate -formoterol  (BREZTRI  AEROSPHERE) 160-9-4.8 MCG/ACT AERO inhaler; Inhale 2 puffs into the lungs 2 (two) times daily.   albuterol  (VENTOLIN  HFA) 108 (90 Base) MCG/ACT inhaler; Inhale 2 puffs into the lungs every 6 (six) hours as needed for wheezing or shortness of breath.

## 2024-04-20 NOTE — Telephone Encounter (Signed)
 Paperwork for dupixent handed into pharmacy

## 2024-04-26 NOTE — Progress Notes (Unsigned)
 Cardiology Office Note:  .   Date:  04/27/2024  ID:  Mckenzie Park, DOB [birth year], MRN [id number] PCP: Larnell Hamilton, MD  [facility] Providers Cardiologist:  Gordy Bergamo, MD   History of Present Illness: .   Mckenzie Park is a 48 y.o. female with a past medical history of HTN, HLD, morbid obesity, family history of premature CAD, generalized anxiety disorder, fibromyalgia, and chronic palpitations presents for 82-month office visit for ongoing dyspnea on exertion and was evaluated by Dr. Dorn Chill, pulmonary medicine recommended right heart catheterization.  She has had an echocardiogram on 02/26/2024 revealing normal LV systolic function, mild pulm hypertension, PASP 37 mmHg with normal RV systolic function with blunted IVC response to respiration suggesting elevated CVP.  She had no obstructive lesion on exam.  Normal coronary arteries.    Discussed the use of AI scribe software for clinical note transcription with the patient, who gave verbal consent to proceed.  History of Present Illness Mckenzie Park is a 48 year old female who presents with shortness of breath and weight gain. She was referred by Dr. Dorn Dingwall for evaluation of shortness of breath and potential heart failure.  Over the past 6 weeks she has had significant shortness of breath at rest and with exertion. She previously had a left heart catheterization that showed no obstructive coronary disease.  During the same period she has had notable weight gain with ankle and foot swelling, despite limiting salt and sugary beverages and mainly drinking water.  She has noticed slower thinking and processing, which worries her in the context of her family history of heart failure and COPD/emphysema in her mother.  She takes verapamil  SR 180 mg daily, Lexapro , and Topamax . She has propranolol  as needed for palpitations but avoids it after a prior ICU admission she associates with its use. She has more frequent  palpitations, often when she is short of breath.  She notes a persistently low heart rate around 38 bpm, associated with fatigue and low energy. She has not recently gone to the emergency room for palpitations.  Cardiac Studies relevent.    CARDIAC CATHETERIZATION 06/12/2023  Normal coronary arteries. Normal microcirculatory function. Catheter induced proximal LAD spasm. Do not suspect coronary spasm.    ECHOCARDIOGRAM COMPLETE 02/26/2024  1. Left ventricular ejection fraction, by estimation, is 60 to 65%. The left ventricle has normal function. The left ventricle has no regional wall motion abnormalities. Left ventricular diastolic parameters are indeterminate. 2. Right ventricular systolic function is normal. The right ventricular size is normal. There is mildly elevated pulmonary artery systolic pressure. The estimated right ventricular systolic pressure is 36.9 mmHg. 3. Left atrial size was moderately dilated. 4. Right atrial size was mildly dilated. 5.  Mild tricuspid regurgitation, PASP 37 mmHg with a CVP of 8 mmHg.  Labs   Lab Results  Component Value Date   CHOL 101 10/18/2023   HDL 46 10/18/2023   LDLCALC 45 10/18/2023   TRIG 52 10/18/2023   CHOLHDL 2.2 10/18/2023   No results found for: LIPOA  Recent Labs    10/17/23 1030 10/17/23 1038 10/18/23 0508  NA 136 138 140  K 4.6 4.6 4.3  CL 106 106 109  CO2 24  --  25  GLUCOSE 101* 95 97  BUN 12 13 9   CREATININE 1.01* 1.10* 0.91  CALCIUM  8.9  --  8.5*  GFRNONAA >60  --  >60    Lab Results  Component Value Date   ALT 28 10/18/2023  AST 30 10/18/2023   ALKPHOS 68 10/18/2023   BILITOT 0.5 10/18/2023      Latest Ref Rng & Units 10/18/2023    5:08 AM 10/17/2023   10:38 AM 10/17/2023   10:30 AM  CBC  WBC 4.0 - 10.5 K/uL 5.3   7.0   Hemoglobin 12.0 - 15.0 g/dL 88.2  88.0  88.6   Hematocrit 36.0 - 46.0 % 35.6  35.0  34.9   Platelets 150 - 400 K/uL 256   305    Lab Results  Component Value Date   HGBA1C 5.5  10/18/2023    Lab Results  Component Value Date   TSH 3.76 05/28/2021    ROS  Review of Systems  Cardiovascular:  Negative for chest pain, dyspnea on exertion and leg swelling.   Physical Exam:   VS:  BP 110/62   Pulse (!) 40   Ht 5' 5.5 (1.664 m)   Wt 274 lb (124.3 kg)   SpO2 97%   BMI 44.90 kg/m    Wt Readings from Last 3 Encounters:  04/27/24 274 lb (124.3 kg)  04/20/24 275 lb (124.7 kg)  01/20/24 263 lb 12.8 oz (119.7 kg)    BP Readings from Last 3 Encounters:  04/27/24 110/62  04/20/24 118/62  01/20/24 120/78   Physical Exam Constitutional:      Appearance: She is morbidly obese.  Neck:     Vascular: No carotid bruit or JVD.  Cardiovascular:     Rate and Rhythm: Normal rate and regular rhythm.     Pulses: Intact distal pulses.     Heart sounds: Normal heart sounds. No murmur heard.    No gallop.  Pulmonary:     Effort: Pulmonary effort is normal.     Breath sounds: Normal breath sounds.  Abdominal:     General: Abdomen is protuberant. Bowel sounds are normal.     Palpations: Abdomen is soft.  Musculoskeletal:     Right lower leg: No edema.     Left lower leg: No edema.    EKG:    EKG Interpretation Date/Time:  Wednesday April 27 2024 09:36:23 EST Ventricular Rate:  38 PR Interval:  162 QRS Duration:  94 QT Interval:  530 QTC Calculation: 421 R Axis:   6  Text Interpretation: EKG 04/27/2024: EKG 04/27/2024: Marked sinus bradycardia at rate of 38 bpm and voltage complexes otherwise normal EKG.  Compared to 10/18/2023, heart rate has further reduced from 40 bpm to the present 38 bpm.  No change from 10/17/2023 and 06/08/2023. Confirmed by Darden Flemister, Jagadeesh ([id number]) on 04/27/2024 9:48:33 AM    ASSESSMENT AND PLAN: .      ICD-10-CM   1. Dyspnea on exertion  R06.09 CBC    Full code    2. Essential hypertension  I10 EKG 12-Lead    3. Drug-induced sinus bradycardia  R00.1    T50.905A      Assessment & Plan Drug-induced bradycardia Bradycardia with  heart rate of 38 bpm, likely due to verapamil , propranolol , Lexapro , and Topamax . Symptoms include fatigue, shortness of breath, and weight gain. No cardiac issues identified from previous evaluations. - Stopped verapamil . - Instructed to start propranolol  10 mg twice daily for one week, then reduce to 5 mg twice daily for 3-4 days, then stop and take as needed for palpitations. - Monitor heart rate and symptoms; if heart rate remains low, consult neurologist regarding Topamax  and Lexapro . - Reassured her that palpitations that are chronic with knowing the fact that her  coronary arteries are normal and heart function is normal  Dyspnea and lower extremity edema Dyspnea and lower extremity edema possibly related risk to lung disease from morbid obesity, prior BNP and imaging studies have not revealed suggestion of heart failure or pulmonary hypertension.  However this needs to be excluded.   Underlying marked sinus bradycardia may also be contributing to her dyspnea.  Verapamil  discontinued.   Previous left heart catheterization showed no blockages. Right heart catheterization planned to assess for pulmonary hypertension. Discussed risks of right heart catheterization, including rare complications such as cardiac arrest and vein inflammation. - Scheduled right heart catheterization to assess for pulmonary hypertension.  Obesity with abnormal weight gain Significant weight gain despite low intake of salt, sugar, and sodas. Possible metabolic derangement or lipedema. No endocrinologist currently involved. - Consider referral to metabolic clinic for evaluation of potential metabolic derangement. - Will coordinate with primary care physician for referral to metabolic clinic. - Unless I see significant abnormality on right heart catheterization, I will see her back on a as needed basis.   Follow up: PRN Signed,  Gordy Bergamo, MD, [facility] 04/27/2024, 6:50 PM [facility] [address] [place], [place] [address] Phone: [phone]. Fax:  [phone]

## 2024-04-26 NOTE — H&P (View-Only) (Signed)
 Cardiology Office Note:  .   Date:  04/27/2024  ID:  Mckenzie Park, DOB 1976/05/21, MRN [id number] PCP: Larnell Hamilton, MD   [facility] Providers Cardiologist:  Gordy Bergamo, MD   History of Present Illness: .   Mckenzie Park is a 48 y.o. female with a past medical history of HTN, HLD, morbid obesity, family history of premature CAD, generalized anxiety disorder, fibromyalgia, and chronic palpitations presents for 86-month office visit for ongoing dyspnea on exertion and was evaluated by Dr. Dorn Chill, pulmonary medicine recommended right heart catheterization.  She has had an echocardiogram on 02/26/2024 revealing normal LV systolic function, mild pulm hypertension, PASP 37 mmHg with normal RV systolic function with blunted IVC response to respiration suggesting elevated CVP.  She had no obstructive lesion on exam.  Normal coronary arteries.    Discussed the use of AI scribe software for clinical note transcription with the patient, who gave verbal consent to proceed.  History of Present Illness Mckenzie Park is a 48 year old female who presents with shortness of breath and weight gain. She was referred by Dr. Dorn Dingwall for evaluation of shortness of breath and potential heart failure.  Over the past 6 weeks she has had significant shortness of breath at rest and with exertion. She previously had a left heart catheterization that showed no obstructive coronary disease.  During the same period she has had notable weight gain with ankle and foot swelling, despite limiting salt and sugary beverages and mainly drinking water.  She has noticed slower thinking and processing, which worries her in the context of her family history of heart failure and COPD/emphysema in her mother.  She takes verapamil  SR 180 mg daily, Lexapro , and Topamax . She has propranolol  as needed for palpitations but avoids it after a prior ICU admission she associates with its use. She has more frequent  palpitations, often when she is short of breath.  She notes a persistently low heart rate around 38 bpm, associated with fatigue and low energy. She has not recently gone to the emergency room for palpitations.  Cardiac Studies relevent.    CARDIAC CATHETERIZATION 06/12/2023  Normal coronary arteries. Normal microcirculatory function. Catheter induced proximal LAD spasm. Do not suspect coronary spasm.    ECHOCARDIOGRAM COMPLETE 02/26/2024  1. Left ventricular ejection fraction, by estimation, is 60 to 65%. The left ventricle has normal function. The left ventricle has no regional wall motion abnormalities. Left ventricular diastolic parameters are indeterminate. 2. Right ventricular systolic function is normal. The right ventricular size is normal. There is mildly elevated pulmonary artery systolic pressure. The estimated right ventricular systolic pressure is 36.9 mmHg. 3. Left atrial size was moderately dilated. 4. Right atrial size was mildly dilated. 5.  Mild tricuspid regurgitation, PASP 37 mmHg with a CVP of 8 mmHg.  Labs   Lab Results  Component Value Date   CHOL 101 10/18/2023   HDL 46 10/18/2023   LDLCALC 45 10/18/2023   TRIG 52 10/18/2023   CHOLHDL 2.2 10/18/2023   No results found for: LIPOA  Recent Labs    10/17/23 1030 10/17/23 1038 10/18/23 0508  NA 136 138 140  K 4.6 4.6 4.3  CL 106 106 109  CO2 24  --  25  GLUCOSE 101* 95 97  BUN 12 13 9   CREATININE 1.01* 1.10* 0.91  CALCIUM  8.9  --  8.5*  GFRNONAA >60  --  >60    Lab Results  Component Value Date   ALT 28 10/18/2023  AST 30 10/18/2023   ALKPHOS 68 10/18/2023   BILITOT 0.5 10/18/2023      Latest Ref Rng & Units 10/18/2023    5:08 AM 10/17/2023   10:38 AM 10/17/2023   10:30 AM  CBC  WBC 4.0 - 10.5 K/uL 5.3   7.0   Hemoglobin 12.0 - 15.0 g/dL 88.2  88.0  88.6   Hematocrit 36.0 - 46.0 % 35.6  35.0  34.9   Platelets 150 - 400 K/uL 256   305    Lab Results  Component Value Date   HGBA1C 5.5  10/18/2023    Lab Results  Component Value Date   TSH 3.76 05/28/2021    ROS  Review of Systems  Cardiovascular:  Negative for chest pain, dyspnea on exertion and leg swelling.   Physical Exam:   VS:  BP 110/62   Pulse (!) 40   Ht 5' 5.5 (1.664 m)   Wt 274 lb (124.3 kg)   SpO2 97%   BMI 44.90 kg/m    Wt Readings from Last 3 Encounters:  04/27/24 274 lb (124.3 kg)  04/20/24 275 lb (124.7 kg)  01/20/24 263 lb 12.8 oz (119.7 kg)    BP Readings from Last 3 Encounters:  04/27/24 110/62  04/20/24 118/62  01/20/24 120/78   Physical Exam Constitutional:      Appearance: She is morbidly obese.  Neck:     Vascular: No carotid bruit or JVD.  Cardiovascular:     Rate and Rhythm: Normal rate and regular rhythm.     Pulses: Intact distal pulses.     Heart sounds: Normal heart sounds. No murmur heard.    No gallop.  Pulmonary:     Effort: Pulmonary effort is normal.     Breath sounds: Normal breath sounds.  Abdominal:     General: Abdomen is protuberant. Bowel sounds are normal.     Palpations: Abdomen is soft.  Musculoskeletal:     Right lower leg: No edema.     Left lower leg: No edema.    EKG:    EKG Interpretation Date/Time:  Wednesday April 27 2024 09:36:23 EST Ventricular Rate:  38 PR Interval:  162 QRS Duration:  94 QT Interval:  530 QTC Calculation: 421 R Axis:   6  Text Interpretation: EKG 04/27/2024: EKG 04/27/2024: Marked sinus bradycardia at rate of 38 bpm and voltage complexes otherwise normal EKG.  Compared to 10/18/2023, heart rate has further reduced from 40 bpm to the present 38 bpm.  No change from 10/17/2023 and 06/08/2023. Confirmed by Srinika Delone, Jagadeesh ([id number]) on 04/27/2024 9:48:33 AM    ASSESSMENT AND PLAN: .      ICD-10-CM   1. Dyspnea on exertion  R06.09 CBC    Full code    2. Essential hypertension  I10 EKG 12-Lead    3. Drug-induced sinus bradycardia  R00.1    T50.905A      Assessment & Plan Drug-induced bradycardia Bradycardia with  heart rate of 38 bpm, likely due to verapamil , propranolol , Lexapro , and Topamax . Symptoms include fatigue, shortness of breath, and weight gain. No cardiac issues identified from previous evaluations. - Stopped verapamil . - Instructed to start propranolol  10 mg twice daily for one week, then reduce to 5 mg twice daily for 3-4 days, then stop and take as needed for palpitations. - Monitor heart rate and symptoms; if heart rate remains low, consult neurologist regarding Topamax  and Lexapro . - Reassured her that palpitations that are chronic with knowing the fact that her  coronary arteries are normal and heart function is normal  Dyspnea and lower extremity edema Dyspnea and lower extremity edema possibly related risk to lung disease from morbid obesity, prior BNP and imaging studies have not revealed suggestion of heart failure or pulmonary hypertension.  However this needs to be excluded.   Underlying marked sinus bradycardia may also be contributing to her dyspnea.  Verapamil  discontinued.   Previous left heart catheterization showed no blockages. Right heart catheterization planned to assess for pulmonary hypertension. Discussed risks of right heart catheterization, including rare complications such as cardiac arrest and vein inflammation. - Scheduled right heart catheterization to assess for pulmonary hypertension.  Obesity with abnormal weight gain Significant weight gain despite low intake of salt, sugar, and sodas. Possible metabolic derangement or lipedema. No endocrinologist currently involved. - Consider referral to metabolic clinic for evaluation of potential metabolic derangement. - Will coordinate with primary care physician for referral to metabolic clinic. - Unless I see significant abnormality on right heart catheterization, I will see her back on a as needed basis.   Follow up: PRN Signed,  Gordy Bergamo, MD, [facility] 04/27/2024, 6:50 PM [facility] [address] [place], [place] [address] Phone: [phone]. Fax:  [phone]

## 2024-04-27 ENCOUNTER — Ambulatory Visit: Attending: Cardiology | Admitting: Cardiology

## 2024-04-27 ENCOUNTER — Telehealth: Payer: Self-pay

## 2024-04-27 ENCOUNTER — Encounter: Payer: Self-pay | Admitting: Cardiology

## 2024-04-27 VITALS — BP 110/62 | HR 40 | Ht 65.5 in | Wt 274.0 lb

## 2024-04-27 DIAGNOSIS — R0609 Other forms of dyspnea: Secondary | ICD-10-CM | POA: Diagnosis not present

## 2024-04-27 DIAGNOSIS — I10 Essential (primary) hypertension: Secondary | ICD-10-CM

## 2024-04-27 DIAGNOSIS — T50905D Adverse effect of unspecified drugs, medicaments and biological substances, subsequent encounter: Secondary | ICD-10-CM | POA: Diagnosis not present

## 2024-04-27 DIAGNOSIS — R001 Bradycardia, unspecified: Secondary | ICD-10-CM | POA: Diagnosis not present

## 2024-04-27 LAB — CBC
Hematocrit: 40 % (ref 34.0–46.6)
Hemoglobin: 12.7 g/dL (ref 11.1–15.9)
MCH: 28.4 pg (ref 26.6–33.0)
MCHC: 31.8 g/dL (ref 31.5–35.7)
MCV: 90 fL (ref 79–97)
Platelets: 321 x10E3/uL (ref 150–450)
RBC: 4.47 x10E6/uL (ref 3.77–5.28)
RDW: 14.4 % (ref 11.7–15.4)
WBC: 7.4 x10E3/uL (ref 3.4–10.8)

## 2024-04-27 MED ORDER — VERAPAMIL HCL ER 180 MG PO TBCR: 180.0000 mg | EXTENDED_RELEASE_TABLET | Freq: Every day | ORAL | 0 refills | Status: DC

## 2024-04-27 NOTE — Patient Instructions (Signed)
 Medication Instructions:  Your physician has recommended you make the following change in your medication:  1) STOP taking verapamil   2) Take your propanolol 10 mg (1 tablet) twice daily for 4 days, then 5 mg (1/2 tablet) twice daily for 4 days then go back to taking it as needed. *If you need a refill on your cardiac medications before your next appointment, please call your pharmacy*  Lab Work: CBC (today) *LAB is on the first floor*  If you have labs (blood work) drawn today and your tests are completely normal, you will receive your results only by: MyChart Message (if you have MyChart) OR A paper copy in the mail If you have any lab test that is abnormal or we need to change your treatment, we will call you to review the results.  Testing/Procedures: Heart Catheterization Your physician has requested that you have a cardiac catheterization. Cardiac catheterization is used to diagnose and/or treat various heart conditions. Doctors may recommend this procedure for a number of different reasons. The most common reason is to evaluate chest pain. Chest pain can be a symptom of coronary artery disease (CAD), and cardiac catheterization can show whether plaque is narrowing or blocking your heart's arteries. This procedure is also used to evaluate the valves, as well as measure the blood flow and oxygen levels in different parts of your heart. For further information please visit [email or web address] Please follow instruction sheet, as given.   Follow-Up: At [facility], you and your health needs are our priority.  As part of our continuing mission to provide you with exceptional heart care, our providers are all part of one team.  This team includes your primary Cardiologist (physician) and Advanced Practice Providers or APPs (Physician Assistants and Nurse Practitioners) who all work together to provide you with the care you need, when you need it.  Your next appointment:     As  needed with Gordy Bergamo, MD

## 2024-04-27 NOTE — Telephone Encounter (Signed)
 Called patient to see about scheduling right heart cath. NA, left message to contact our office to scheduled a cath.      Patient has an appointment today at 10am.

## 2024-04-27 NOTE — Telephone Encounter (Signed)
-----   Message from Gordy Bergamo sent at 04/21/2024  9:40 AM EST ----- Regarding: RE: Right Heart Cath Please schedule her for right heart catheterization. I do nto have to see her in the office as I know her well and will discuss with the patient and do HPI in the hospital.  JG ----- Message ----- From: Kara Dorn NOVAK, MD Sent: 04/20/2024  10:03 AM EST To: Gordy Bergamo, MD Subject: Right Heart Cath                               Hi Jay,   I think getting a RHC would be helpful to evaluate any potential for pulmonary hypertension. She is having progressive dyspnea with weight gain over past 4-6 weeks and lower extremity edema. She has appointment with you on 12/3.  She is compliant with CPAP and inhalers.   I am going to get her started on dupixent to maximize her asthma therapy.   Sending her to weight loss clinic.  Thanks, Thom

## 2024-04-27 NOTE — Telephone Encounter (Signed)
 Called patient to see about scheduling right heart cath. NA, left message to contact our office to scheduled a cath.

## 2024-04-28 ENCOUNTER — Telehealth: Payer: Self-pay

## 2024-04-28 NOTE — Telephone Encounter (Signed)
 Received Dupixent new start paperwork. Submitted a Prior Authorization request to [employer] for DUPIXENT via CoverMyMeds. Will update once we receive a response.  Key: [id number]

## 2024-05-01 ENCOUNTER — Telehealth: Payer: Self-pay | Admitting: Cardiology

## 2024-05-01 DIAGNOSIS — Z6841 Body Mass Index (BMI) 40.0 and over, adult: Secondary | ICD-10-CM

## 2024-05-01 DIAGNOSIS — R635 Abnormal weight gain: Secondary | ICD-10-CM

## 2024-05-01 NOTE — Telephone Encounter (Signed)
 OV and telephone encounter for referral faxed to [place], Attn: Camellia Loach, MD

## 2024-05-01 NOTE — Telephone Encounter (Signed)
 ICD-10-CM   1. Abnormal weight gain  R63.5 Ambulatory referral to Endocrinology    2. Class 3 severe obesity due to excess calories without serious comorbidity with body mass index (BMI) of 40.0 to 44.9 in adult [facility])  Z33.186 Ambulatory referral to Endocrinology   Z68.41      Orders Placed This Encounter  Procedures   Ambulatory referral to Endocrinology    Referral Priority:   Routine    Referral Type:   Consultation    Referral Reason:   Specialty Services Required    Referred to Provider:   Teena Camellia BROCKS, MD    Number of Visits Requested:   1

## 2024-05-02 ENCOUNTER — Telehealth: Payer: Self-pay | Admitting: *Deleted

## 2024-05-02 NOTE — Telephone Encounter (Signed)
 Right Heart Cath scheduled at [facility] for: Tuesday May 03, 2024 12 Noon Arrival time [facility] Main Entrance A at: 9:30 AM-needs BMP  Diet: -May have light meal until 6 AM. (6 hours before procedure time) Approved light meal consists of plain toast, fruit, light soups, crackers.  Hydration: -May drink clear liquids until 2 hours before the procedure.  Approved liquids: Water, clear tea, black coffee, fruit juices-non-citric and without pulp,Gatorade, plain Jello/popsicles.  Medication instructions: -Usual morning medications can be taken.  Plan to go home the same day, you will only stay overnight if medically necessary.  You must have responsible adult to drive you home.  Someone must be with you the first 24 hours after you arrive home.  Reviewed procedure instructions with patient.

## 2024-05-03 ENCOUNTER — Ambulatory Visit (HOSPITAL_COMMUNITY)
Admission: RE | Admit: 2024-05-03 | Discharge: 2024-05-03 | Disposition: A | Attending: Cardiology | Admitting: Cardiology

## 2024-05-03 ENCOUNTER — Other Ambulatory Visit: Payer: Self-pay

## 2024-05-03 ENCOUNTER — Other Ambulatory Visit (HOSPITAL_COMMUNITY): Payer: Self-pay

## 2024-05-03 ENCOUNTER — Encounter (HOSPITAL_COMMUNITY): Admission: RE | Disposition: A | Payer: Self-pay | Attending: Cardiology

## 2024-05-03 ENCOUNTER — Encounter (HOSPITAL_COMMUNITY): Payer: Self-pay | Admitting: Cardiology

## 2024-05-03 ENCOUNTER — Other Ambulatory Visit: Payer: Self-pay | Admitting: Cardiology

## 2024-05-03 DIAGNOSIS — I272 Pulmonary hypertension, unspecified: Secondary | ICD-10-CM

## 2024-05-03 DIAGNOSIS — R001 Bradycardia, unspecified: Secondary | ICD-10-CM

## 2024-05-03 DIAGNOSIS — R0609 Other forms of dyspnea: Secondary | ICD-10-CM

## 2024-05-03 DIAGNOSIS — Z01812 Encounter for preprocedural laboratory examination: Secondary | ICD-10-CM

## 2024-05-03 HISTORY — PX: RIGHT HEART CATH: CATH118263

## 2024-05-03 SURGERY — RIGHT HEART CATH

## 2024-05-03 MED ORDER — LIDOCAINE HCL (PF) 1 % IJ SOLN
INTRAMUSCULAR | Status: DC | PRN
  Administered 2024-05-03: 5 mL

## 2024-05-03 MED ORDER — HEPARIN (PORCINE) IN NACL 1000-0.9 UT/500ML-% IV SOLN
INTRAVENOUS | Status: DC | PRN
  Administered 2024-05-03: 500 mL

## 2024-05-03 MED ORDER — SODIUM CHLORIDE 0.9% FLUSH: 3.0000 mL | INTRAVENOUS | Status: DC | PRN

## 2024-05-03 MED ORDER — SODIUM CHLORIDE 0.9 % IV SOLN: 250.0000 mL | INTRAVENOUS | Status: DC | PRN

## 2024-05-03 MED ORDER — LIDOCAINE HCL (PF) 1 % IJ SOLN
INTRAMUSCULAR | Status: AC
  Filled 2024-05-03: qty 30

## 2024-05-03 MED ORDER — SODIUM CHLORIDE 0.9% FLUSH: 3.0000 mL | Freq: Two times a day (BID) | INTRAVENOUS | Status: DC

## 2024-05-03 SURGICAL SUPPLY — 5 items
CATH SWAN GANZ 7F STRAIGHT (CATHETERS) IMPLANT
GLIDESHEATH SLENDER 7FR .021G (SHEATH) IMPLANT
PACK CARDIAC CATHETERIZATION (CUSTOM PROCEDURE TRAY) IMPLANT
TRANSDUCER W/STOPCOCK (MISCELLANEOUS) IMPLANT
TUBING ART PRESS 72 MALE/FEM (TUBING) IMPLANT

## 2024-05-03 NOTE — Progress Notes (Signed)
 Pt and husband received discharge instructions, teach back performed. Iv's removed, no complications, Rt AC site is clean dry intact, site is soft, no signs of bleeding. Pt escorted out via wheelchair.

## 2024-05-03 NOTE — Discharge Instructions (Signed)

## 2024-05-03 NOTE — Telephone Encounter (Addendum)
 Received notification from [facility] regarding a prior authorization for DUPIXENT. Authorization has been APPROVED from 04/28/24 to 10/27/24. Approval letter sent to scan center.  Patient must fill through [facility]: [phone]  Authorization #  [id number]  Attempted to sign patient up for Dupixent coapy card online (sent via email to patient) and can be scheduled for new start

## 2024-05-03 NOTE — Telephone Encounter (Signed)
 Patient seen in clinic 04/27/24.

## 2024-05-03 NOTE — Interval H&P Note (Signed)
 History and Physical Interval Note:  05/03/2024 11:04 AM  Jon Balloon  has presented today for surgery, with the diagnosis of pulm htn.  The various methods of treatment have been discussed with the patient and family. After consideration of risks, benefits and other options for treatment, the patient has consented to  Procedure(s): RIGHT HEART CATH (N/A) as a surgical intervention.  The patient's history has been reviewed, patient examined, no change in status, stable for surgery.  I have reviewed the patient's chart and labs.  Questions were answered to the patient's satisfaction.     Gordy Bergamo

## 2024-05-03 NOTE — Progress Notes (Signed)
 ICD-10-CM   1. Dyspnea on exertion  R06.09 Informed Consent Details: Physician/Practitioner Attestation; Transcribe to consent form and obtain patient signature    Exercise Tolerance Test    2. Bradycardia  R00.1 Informed Consent Details: Physician/Practitioner Attestation; Transcribe to consent form and obtain patient signature    Exercise Tolerance Test     Orders Placed This Encounter  Procedures   Informed Consent Details: Physician/Practitioner Attestation; Transcribe to consent form and obtain patient signature    Physician/Practitioner attestation of informed consent for procedure/surgical case:   I, the physician/practitioner, attest that I have discussed with the patient the benefits, risks, side effects, alternatives, likelihood of achieving goals and potential problems during recovery for the procedure that I have provided informed consent.    Procedure:   Treadmill exercise stress test    Physician/Practitioner performing the procedure:   Gordy Bergamo    Indication/Reason:   Dyspnea, Bradycardia   Exercise Tolerance Test    Standing Status:   Future    Expected Date:   06/03/2024    Expiration Date:   07/04/2024    Where should this test be performed:   [facility]    Stress with pharmacologic or treadmill ?:   Treadmill w/ exercise    In spite of discontinuing verapamil  on her prior office visit, patient continues to have very low heart rate in 30s to 40s beat per minute, some of the dyspnea could be related to chronotropic incompetence.  She is presently not using propranolol  10 mg 3 times daily as needed for palpitations will schedule her for a routine treadmill exercise stress test to evaluate for chronotropic incompetence.  Right heart catheterization essentially normal.

## 2024-05-04 LAB — POCT I-STAT EG7
Acid-base deficit: 1 mmol/L (ref 0.0–2.0)
Acid-base deficit: 1 mmol/L (ref 0.0–2.0)
Acid-base deficit: 2 mmol/L (ref 0.0–2.0)
Bicarbonate: 25.3 mmol/L (ref 20.0–28.0)
Bicarbonate: 24.4 mmol/L (ref 20.0–28.0)
Bicarbonate: 25.1 mmol/L (ref 20.0–28.0)
Calcium, Ion: 1.27 mmol/L (ref 1.15–1.40)
Calcium, Ion: 1.25 mmol/L (ref 1.15–1.40)
Calcium, Ion: 1.28 mmol/L (ref 1.15–1.40)
HCT: 37 % (ref 36.0–46.0)
HCT: 36 % (ref 36.0–46.0)
HCT: 37 % (ref 36.0–46.0)
Hemoglobin: 12.6 g/dL (ref 12.0–15.0)
Hemoglobin: 12.2 g/dL (ref 12.0–15.0)
Hemoglobin: 12.6 g/dL (ref 12.0–15.0)
O2 Saturation: 62 %
O2 Saturation: 64 %
O2 Saturation: 64 %
Potassium: 4.3 mmol/L (ref 3.5–5.1)
Potassium: 4.3 mmol/L (ref 3.5–5.1)
Potassium: 4.3 mmol/L (ref 3.5–5.1)
Sodium: 143 mmol/L (ref 135–145)
Sodium: 142 mmol/L (ref 135–145)
Sodium: 143 mmol/L (ref 135–145)
TCO2: 27 mmol/L (ref 22–32)
TCO2: 26 mmol/L (ref 22–32)
TCO2: 27 mmol/L (ref 22–32)
pCO2, Ven: 47.4 mmHg (ref 44–60)
pCO2, Ven: 46 mmHg (ref 44–60)
pCO2, Ven: 47.5 mmHg (ref 44–60)
pH, Ven: 7.336 (ref 7.25–7.43)
pH, Ven: 7.33 (ref 7.25–7.43)
pH, Ven: 7.334 (ref 7.25–7.43)
pO2, Ven: 35 mmHg (ref 32–45)
pO2, Ven: 35 mmHg (ref 32–45)
pO2, Ven: 36 mmHg (ref 32–45)

## 2024-05-09 ENCOUNTER — Institutional Professional Consult (permissible substitution): Admitting: Nurse Practitioner

## 2024-05-09 NOTE — Telephone Encounter (Signed)
 ATC patient for new start Dupixent scheduling. LVM requesting return call to my direct line [phone] for scheduling.

## 2024-05-16 ENCOUNTER — Other Ambulatory Visit: Payer: Self-pay | Admitting: Pulmonary Disease

## 2024-05-16 DIAGNOSIS — J4540 Moderate persistent asthma, uncomplicated: Secondary | ICD-10-CM

## 2024-05-24 ENCOUNTER — Ambulatory Visit (INDEPENDENT_AMBULATORY_CARE_PROVIDER_SITE_OTHER): Admitting: Nurse Practitioner

## 2024-05-24 ENCOUNTER — Encounter: Payer: Self-pay | Admitting: Nurse Practitioner

## 2024-05-24 VITALS — BP 110/76 | HR 51 | Temp 97.9°F | Ht 65.5 in | Wt 258.0 lb

## 2024-05-24 DIAGNOSIS — Z6841 Body Mass Index (BMI) 40.0 and over, adult: Secondary | ICD-10-CM | POA: Diagnosis not present

## 2024-05-24 DIAGNOSIS — E785 Hyperlipidemia, unspecified: Secondary | ICD-10-CM | POA: Diagnosis not present

## 2024-05-24 DIAGNOSIS — I10 Essential (primary) hypertension: Secondary | ICD-10-CM

## 2024-05-24 DIAGNOSIS — E66813 Obesity, class 3: Secondary | ICD-10-CM

## 2024-05-24 NOTE — Progress Notes (Signed)
 " Office: [phone]  /  Fax: [phone]   Initial Visit  Mckenzie Park was seen in clinic today to evaluate for obesity. She is interested in losing weight to improve overall health and reduce the risk of weight related complications. She presents today to review program treatment options, initial physical assessment, and evaluation.     She was referred by: PCP  When asked what else they would like to accomplish? She states: Adopt a healthier eating pattern and lifestyle, Improve energy levels and physical activity, Improve existing medical conditions, and Improve quality of life   When asked how has your weight affected you? She states: Contributed to medical problems, Having fatigue, and Having poor endurance  Some associated conditions: migraines, HTN, CVA, asthma, OSAS, depression, anxiety, fibromyalgia, atherosclerotic heart disease, HLD, back pain, palpitations, vit d def, bradycardia  Contributing factors: family history of obesity, consumption of processed foods, moderate to high levels of stress, reduced physical activity, chronic skipping of meals, menopause, multiple weight loss attempts in the past, and need for convenient foods  Weight promoting medications identified: None  Current nutrition plan: None  Current level of physical activity: None  Current or previous pharmacotherapy: Topiramate   Response to medication: started 1 year ago-denies side effects   Past medical history includes:   Past Medical History:  Diagnosis Date   Anxiety    Depression    GERD (gastroesophageal reflux disease)    Heart murmur    irregular heart beat is on medication   Hyperlipidemia    Hypertension    Kidney stone      Objective:   BP 110/76   Pulse (!) 51   Temp 97.9 F (36.6 C)   Ht 5' 5.5 (1.664 m)   Wt 258 lb (117 kg)   SpO2 95%   BMI 42.28 kg/m  She was weighed on the bioimpedance scale: Body mass index is 42.28 kg/m.  Peak Weight:275 lbs , Body Fat%:48.7%,  Visceral Fat Rating:15, Weight trend over the last 12 months: Increasing  General:  Alert, oriented and cooperative. Patient is in no acute distress.  Respiratory: Normal respiratory effort, no problems with respiration noted   Gait: able to ambulate independently  Mental Status: Normal mood and affect. Normal behavior. Normal judgment and thought content.   DIAGNOSTIC DATA REVIEWED:  BMET    Component Value Date/Time   NA 142 05/03/2024 1123   NA 141 04/10/2021 0930   K 4.3 05/03/2024 1123   CL 109 10/18/2023 0508   CO2 25 10/18/2023 0508   GLUCOSE 97 10/18/2023 0508   BUN 9 10/18/2023 0508   BUN 9 04/10/2021 0930   CREATININE 0.91 10/18/2023 0508   CREATININE 0.56 05/28/2021 0909   CALCIUM  8.5 (L) 10/18/2023 0508   GFRNONAA >60 10/18/2023 0508   GFRAA 128 11/09/2019 1134   Lab Results  Component Value Date   HGBA1C 5.5 10/18/2023   No results found for: INSULIN CBC    Component Value Date/Time   WBC 7.4 04/27/2024 1051   WBC 5.3 10/18/2023 0508   RBC 4.47 04/27/2024 1051   RBC 3.98 10/18/2023 0508   HGB 12.2 05/03/2024 1123   HGB 12.7 04/27/2024 1051   HCT 36.0 05/03/2024 1123   HCT 40.0 04/27/2024 1051   PLT 321 04/27/2024 1051   MCV 90 04/27/2024 1051   MCH 28.4 04/27/2024 1051   MCH 29.4 10/18/2023 0508   MCHC 31.8 04/27/2024 1051   MCHC 32.9 10/18/2023 0508   RDW 14.4 04/27/2024 1051  Iron/TIBC/Ferritin/ %Sat No results found for: IRON, TIBC, FERRITIN, IRONPCTSAT Lipid Panel     Component Value Date/Time   CHOL 101 10/18/2023 0508   TRIG 52 10/18/2023 0508   HDL 46 10/18/2023 0508   CHOLHDL 2.2 10/18/2023 0508   VLDL 10 10/18/2023 0508   LDLCALC 45 10/18/2023 0508   Hepatic Function Panel     Component Value Date/Time   PROT 6.2 (L) 10/18/2023 0508   ALBUMIN 3.2 (L) 10/18/2023 0508   AST 30 10/18/2023 0508   ALT 28 10/18/2023 0508   ALKPHOS 68 10/18/2023 0508   BILITOT 0.5 10/18/2023 0508      Component Value Date/Time   TSH  3.76 05/28/2021 0909     Assessment and Plan:   Essential hypertension Continue to follow up with cardiology and PCP.  Take meds as directed History of CVA  Hyperlipidemia, unspecified hyperlipidemia type Continue to follow up with cardiology and PCP.  Take meds as directed  Obesity, Class III, BMI 40-49.9 (morbid obesity) (HCC)        Obesity Treatment / Action Plan:  Patient will work on garnering support from family and friends to begin weight loss journey. Will work on eliminating or reducing the presence of highly palatable, calorie dense foods in the home. Will complete provided nutritional and psychosocial assessment questionnaire before the next appointment. Will be scheduled for indirect calorimetry to determine resting energy expenditure in a fasting state.  This will allow us  to create a reduced calorie, high-protein meal plan to promote loss of fat mass while preserving muscle mass. Counseled on the health benefits of losing 5%-15% of total body weight. Was counseled on nutritional approaches to weight loss and benefits of reducing processed foods and consuming plant-based foods and high quality protein as part of nutritional weight management. Was counseled on pharmacotherapy and role as an adjunct in weight management.   Obesity Education Performed Today:  She was weighed on the bioimpedance scale and results were discussed and documented in the synopsis.  We discussed obesity as a disease and the importance of a more detailed evaluation of all the factors contributing to the disease.  We discussed the importance of long term lifestyle changes which include nutrition, exercise and behavioral modifications as well as the importance of customizing this to her specific health and social needs.  We discussed the benefits of reaching a healthier weight to alleviate the symptoms of existing conditions and reduce the risks of the biomechanical, metabolic and psychological  effects of obesity.  Mckenzie Park appears to be in the action stage of change and states they are ready to start intensive lifestyle modifications and behavioral modifications.  30 minutes was spent today on this visit including the above counseling, pre-visit chart review, and post-visit documentation.  Reviewed by clinician on day of visit: allergies, medications, problem list, medical history, surgical history, family history, social history, and previous encounter notes pertinent to obesity diagnosis.    Corean SAUNDERS Mckenzie Quinnell FNP-C   "

## 2024-05-25 NOTE — Telephone Encounter (Signed)
 Second ATC patient - LVM with my office line to return call.

## 2024-05-31 ENCOUNTER — Other Ambulatory Visit: Payer: Self-pay | Admitting: Nurse Practitioner

## 2024-06-02 ENCOUNTER — Telehealth (HOSPITAL_COMMUNITY): Payer: Self-pay | Admitting: *Deleted

## 2024-06-02 NOTE — Telephone Encounter (Signed)
 Reminder call with instructions given for upcoming GXT on 06/10/24 at 10:30

## 2024-06-03 NOTE — Telephone Encounter (Signed)
 Third ATC patient for Dupixent scheduling - unable to LVM as mailbox is full.   Will send MyChart message.   If no communication received from patient by 06/10/24, closing encounter.

## 2024-06-10 ENCOUNTER — Ambulatory Visit: Payer: Self-pay | Admitting: Cardiology

## 2024-06-10 ENCOUNTER — Ambulatory Visit (HOSPITAL_COMMUNITY)
Admission: RE | Admit: 2024-06-10 | Discharge: 2024-06-10 | Disposition: A | Discharge status: Home / Self Care | Attending: Cardiology | Admitting: Cardiology

## 2024-06-10 DIAGNOSIS — R0609 Other forms of dyspnea: Secondary | ICD-10-CM | POA: Diagnosis present

## 2024-06-10 DIAGNOSIS — I4589 Other specified conduction disorders: Secondary | ICD-10-CM

## 2024-06-10 DIAGNOSIS — R001 Bradycardia, unspecified: Secondary | ICD-10-CM | POA: Diagnosis present

## 2024-06-10 LAB — EXERCISE TOLERANCE TEST
Angina Index: 0
Duke Treadmill Score: 2
Estimated workload: 3.9
Exercise duration (min): 1 min
Exercise duration (sec): 40 s
MPHR: 171 {beats}/min
Peak HR: 61 {beats}/min
Percent HR: 35 %
RPE: 18
Rest HR: 38 {beats}/min
ST Depression (mm): 0 mm

## 2024-06-10 NOTE — Progress Notes (Signed)
 Needs referral to EP for chronotropic incompetence and consideration for pacemaker

## 2024-06-13 NOTE — Progress Notes (Signed)
 Needs referral to EP for chronotropic incompetence and consideration for pacemaker  You may have already done this. 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

## 2024-06-14 NOTE — Progress Notes (Signed)
 Referral has been placed.

## 2024-06-15 ENCOUNTER — Other Ambulatory Visit: Payer: Self-pay | Admitting: Cardiology

## 2024-06-15 DIAGNOSIS — I10 Essential (primary) hypertension: Secondary | ICD-10-CM

## 2024-06-21 DIAGNOSIS — I2510 Atherosclerotic heart disease of native coronary artery without angina pectoris: Secondary | ICD-10-CM | POA: Insufficient documentation

## 2024-06-21 DIAGNOSIS — M1990 Unspecified osteoarthritis, unspecified site: Secondary | ICD-10-CM | POA: Insufficient documentation

## 2024-06-21 DIAGNOSIS — M5430 Sciatica, unspecified side: Secondary | ICD-10-CM | POA: Insufficient documentation

## 2024-06-21 DIAGNOSIS — E669 Obesity, unspecified: Secondary | ICD-10-CM | POA: Insufficient documentation

## 2024-06-21 DIAGNOSIS — M2550 Pain in unspecified joint: Secondary | ICD-10-CM | POA: Insufficient documentation

## 2024-06-21 DIAGNOSIS — K219 Gastro-esophageal reflux disease without esophagitis: Secondary | ICD-10-CM | POA: Insufficient documentation

## 2024-06-21 DIAGNOSIS — M797 Fibromyalgia: Secondary | ICD-10-CM | POA: Insufficient documentation

## 2024-06-22 ENCOUNTER — Ambulatory Visit: Admitting: Bariatrics

## 2024-06-22 ENCOUNTER — Encounter: Payer: Self-pay | Admitting: Bariatrics

## 2024-06-22 VITALS — BP 117/63 | HR 40 | Ht 65.5 in | Wt 271.0 lb

## 2024-06-22 DIAGNOSIS — I10 Essential (primary) hypertension: Secondary | ICD-10-CM

## 2024-06-22 DIAGNOSIS — R0602 Shortness of breath: Secondary | ICD-10-CM | POA: Diagnosis not present

## 2024-06-22 DIAGNOSIS — R7309 Other abnormal glucose: Secondary | ICD-10-CM

## 2024-06-22 DIAGNOSIS — Z6841 Body Mass Index (BMI) 40.0 and over, adult: Secondary | ICD-10-CM | POA: Diagnosis not present

## 2024-06-22 DIAGNOSIS — E559 Vitamin D deficiency, unspecified: Secondary | ICD-10-CM | POA: Diagnosis not present

## 2024-06-22 DIAGNOSIS — R5383 Other fatigue: Secondary | ICD-10-CM | POA: Diagnosis not present

## 2024-06-22 DIAGNOSIS — E66813 Obesity, class 3: Secondary | ICD-10-CM

## 2024-06-22 DIAGNOSIS — Z1331 Encounter for screening for depression: Secondary | ICD-10-CM | POA: Diagnosis not present

## 2024-06-22 DIAGNOSIS — E785 Hyperlipidemia, unspecified: Secondary | ICD-10-CM

## 2024-06-22 NOTE — Progress Notes (Signed)
 "  At a Glance:  Vitals BP: 117/63 Pulse Rate: (!) 40 SpO2: 97 %   Anthropometric Measurements Height: 5' 5.5 (1.664 m) Weight: 271 lb (122.9 kg) BMI (Calculated): 44.39 Starting Weight: 271lb Peak Weight: 275lb   Body Composition  Body Fat %: 52.3 % Fat Mass (lbs): 142 lbs Muscle Mass (lbs): 122.8 lbs Total Body Water (lbs): 95 lbs Visceral Fat Rating : 17   Other Clinical Data RMR: 2002 Fasting: yes Labs: yes Today's Visit #: 1 Starting Date: 06/22/24     Indirect Calorimeter:   Resting Metabolic Rate ( RMR):  RMR (actual): 2002 kcal RMR (calculated): 1876 kcal The calculated basal metabolic rate is 8123 kcal thus her basal metabolic rate is better than expected.  Plan:   Indirect calorimeter completed, interpreted and reviewed with patient today and allowed to ask questions.  Discussed the implications for the chosen plan and exercise based on the RMR reading.  Will consider repeating the RMR in the future based on weight loss.    Chief Complaint:  Obesity   Subjective:  Mckenzie Park (MR# [id number]) is a 49 y.o. female who presents for evaluation and treatment of obesity and related comorbidities.   Mckenzie Park is currently in the action stage of change and ready to dedicate time achieving and maintaining a healthier weight. Mckenzie Park is interested in becoming our patient and working on intensive lifestyle modifications including (but not limited to) diet and exercise for weight loss.  Mckenzie Park has been struggling with her weight. She has been unsuccessful in either losing weight, maintaining weight loss, or reaching her healthy weight goal.  Mckenzie Park's habits were reviewed today and are as follows: Her family eats meals together, she thinks her family will eat healthier with her, she has been heavy most of her life, she started gaining weight recently, she has significant food cravings issues, she snacks frequently in the evenings, she skips meals frequently, and  she struggles with emotional eating.   Current or previous pharmacotherapy: Topiramate  (started 1 year ago, denies side effects)  Response to medication: no side effects   Other Fatigue Mckenzie Park admits to daytime somnolence and admits to waking up still tired. Patient has a history of symptoms of daytime fatigue. Mckenzie Park generally gets 3 hours of sleep per night, and states that she has difficulty falling asleep and difficulty falling back asleep if awakened. Snoring is present. Apneic episodes is present. Epworth Sleepiness Score is 4.   Shortness of Breath Mckenzie Park notes increasing shortness of breath with exercising and seems to be worsening over time with weight gain. She notes getting out of breath sooner with activity than she used to. This has gotten worse recently. Mckenzie Park denies shortness of breath at rest or orthopnea.  Depression Screen Mckenzie Park's Food and Mood (modified PHQ-9) score was 20. 20-27 severe depression     06/22/2024    7:19 AM  Depression screen PHQ 2/9  Decreased Interest 2  Down, Depressed, Hopeless 2  PHQ - 2 Score 4  Altered sleeping 2  Tired, decreased energy 3  Change in appetite 3  Feeling bad or failure about yourself  2  Trouble concentrating 2  Moving slowly or fidgety/restless 2  Suicidal thoughts 2  PHQ-9 Score 20  Difficult doing work/chores Very difficult     Assessment and Plan:   Other Fatigue Mckenzie Park does feel that her weight is causing her energy to be lower than it should be. Fatigue may be related to obesity, depression or many other causes. Labs will  be ordered, and in the meanwhile, Mckenzie Park will focus on self care including making healthy food choices, increasing physical activity and focusing on stress reduction.  Shortness of Breath Mckenzie Park does not feel that she gets out of breath more easily that she used to when she exercises. Mckenzie Park's shortness of breath appears to be obesity related and exercise induced. She has agreed to work on  weight loss and gradually increase exercise to treat her exercise induced shortness of breath. Will continue to monitor closely.  Health Maintenance:   Obesity   Plan: Will do EKG, indirect calorimetry, and labs.     Vitamin D  Deficiency She is at risk for vitamin D  deficiency due to obesity.  She is not on vitamin D     Plan: Will check for vitamin D  deficiency.   Mckenzie Park had a positive depression screening. Depression is commonly associated with obesity and often results in emotional eating behaviors. We will monitor this closely and work on CBT to help improve the non-hunger eating patterns. Referral to Psychology may be required if no improvement is seen as she continues in our clinic.   Other fatigue -     TSH+T4F+T3Free  SOB (shortness of breath) on exertion -     TSH+T4F+T3Free  Depression screening  Essential hypertension -     Hemoglobin A1c -     Insulin, random -     Lipid Panel With LDL/HDL Ratio -     TSH+T4F+T3Free -     VITAMIN D  25 Hydroxy (Vit-D Deficiency, Fractures) -     Comprehensive metabolic panel with GFR  Hyperlipidemia, unspecified hyperlipidemia type -     Hemoglobin A1c -     Insulin, random -     Lipid Panel With LDL/HDL Ratio -     TSH+T4F+T3Free -     VITAMIN D  25 Hydroxy (Vit-D Deficiency, Fractures) -     Comprehensive metabolic panel with GFR  Obesity, Class III, BMI 40-49.9 (morbid obesity) (HCC)  Elevated glucose -     Hemoglobin A1c -     Insulin, random  Vitamin D  deficiency -     VITAMIN D  25 Hydroxy (Vit-D Deficiency, Fractures)   Hypertension Hypertension well controlled.  Medication(s): Cozaar  100 mg once daily   BP Readings from Last 3 Encounters:  06/22/24 117/63  05/24/24 110/76  05/03/24 125/81   Lab Results  Component Value Date   CREATININE 0.91 10/18/2023   CREATININE 1.10 (H) 10/17/2023   CREATININE 1.01 (H) 10/17/2023   No results found for: GFR  Plan: Continue all antihypertensives at current  dosages. No added salt. Will keep sodium content to 1,500 mg or less per day.   Hyperlipidemia LDL is at goal. Medication(s): Crestor   Cardiovascular risk factors: dyslipidemia, hypertension, obesity (BMI >= 30 kg/m2), and sedentary lifestyle  Lab Results  Component Value Date   CHOL 101 10/18/2023   HDL 46 10/18/2023   LDLCALC 45 10/18/2023   TRIG 52 10/18/2023   CHOLHDL 2.2 10/18/2023   Lab Results  Component Value Date   ALT 28 10/18/2023   AST 30 10/18/2023   ALKPHOS 68 10/18/2023   BILITOT 0.5 10/18/2023   The ASCVD Risk score (Arnett DK, et al., 2019) failed to calculate for the following reasons:   Risk score cannot be calculated because patient has a medical history suggesting prior/existing ASCVD   * - Cholesterol units were assumed  Plan:  Continue statin.  Will avoid all trans fats.  Will read labels Will minimize saturated fats  except the following: low fat meats in moderation, diary, and limited dark chocolate.   Elevated glucose:  She is at risk for insulin resistance, prediabetes, and diabetes secondary to obesity.    Plan:  Will check Hemoglobin A1c and insulin today.   Previous labs reviewed today. Date: NA, K, Ca++, Hgb/Hct  Labs done today CMP, Lipids, Insulin, HgbA1c, Vit D, and Thyroid Panel   Morbid Obesity: BMI (Calculated): 44.39   Mckenzie Park is currently in the action stage of change and her goal is to begin weight loss efforts. I recommend Mckenzie Park begin the structured treatment plan as follows:  She has agreed to Category 3 Plan  Exercise goals: All adults should avoid inactivity. Some activity is better than none, and adults who participate in any amount of physical activity, gain some health benefits.  Behavioral modification strategies:increasing lean protein intake, decreasing simple carbohydrates, increasing vegetables, increase H2O intake, decreasing eating out, no skipping meals, meal planning and cooking strategies, keeping healthy  foods in the home, better snacking choices, avoiding temptations, and planning for success  She was informed of the importance of frequent follow-up visits to maximize her success with intensive lifestyle modifications for her multiple health conditions. She was informed we would discuss her lab results at her next visit unless there is a critical issue that needs to be addressed sooner. Mckenzie Park agreed to keep her next visit at the agreed upon time to discuss these results.  Objective:  General: Cooperative, alert, well developed, in no acute distress. HEENT: Conjunctivae and lids unremarkable. Cardiovascular: Regular rhythm.  Lungs: Normal work of breathing. Neurologic: No focal deficits.   Lab Results  Component Value Date   CREATININE 0.91 10/18/2023   BUN 9 10/18/2023   NA 142 05/03/2024   K 4.3 05/03/2024   CL 109 10/18/2023   CO2 25 10/18/2023   Lab Results  Component Value Date   ALT 28 10/18/2023   AST 30 10/18/2023   ALKPHOS 68 10/18/2023   BILITOT 0.5 10/18/2023   Lab Results  Component Value Date   HGBA1C 5.5 10/18/2023   No results found for: INSULIN Lab Results  Component Value Date   TSH 3.76 05/28/2021   Lab Results  Component Value Date   CHOL 101 10/18/2023   HDL 46 10/18/2023   LDLCALC 45 10/18/2023   TRIG 52 10/18/2023   CHOLHDL 2.2 10/18/2023   Lab Results  Component Value Date   WBC 7.4 04/27/2024   HGB 12.2 05/03/2024   HCT 36.0 05/03/2024   MCV 90 04/27/2024   PLT 321 04/27/2024   No results found for: IRON, TIBC, FERRITIN  Attestation Statements:  Applicable history such as the following:  allergies, medications, problem list, medical history, surgical history, family history, social history, and previous encounter notes reviewed by clinician on day of visit:  Time spent on visit in care of the patient today including the items listed below was 40 minutes.    20 minutes were spent talking about the history, 20 minutes for  face to face counseling implementing the plan, discussing the specifics of how to arrange meals, meal planning, water intake.   I spent face to face time discussing his/her plan, including breakfast, additional breakfast options, lunch, and dinner options, grocery list, and snacks.  I reviewed her indirect calorimetry. I reviewed her risk factors for a low metabolism. I discussed the implications for the diet plan.    Discussed the bio-impedence test (fat %, muscle mass, and water weight) and allowed the  patient to ask questions.   Discussed the following information sheets: Category 3, Grocery List, 100 Calorie Snacks, 200 Calorie Snacks, Microwave Meals, and Protein shakes.   I reviewed the labs which were ordered from her visit on 05/03/24.   I additionally spent time documenting, reviewing, and checking the codes before submitting.   This may have been prepared with the assistance of Engineer, Civil (consulting).  Occasional wrong-word or sound-a-like substitutions may have occurred due to the inherent limitations of voice recognition software.    Clayborne Daring, DO    "

## 2024-06-23 ENCOUNTER — Encounter (INDEPENDENT_AMBULATORY_CARE_PROVIDER_SITE_OTHER): Payer: Self-pay | Admitting: Bariatrics

## 2024-06-23 DIAGNOSIS — R7303 Prediabetes: Secondary | ICD-10-CM | POA: Insufficient documentation

## 2024-06-23 LAB — LIPID PANEL WITH LDL/HDL RATIO
Cholesterol, Total: 124 mg/dL (ref 100–199)
HDL: 44 mg/dL
LDL Chol Calc (NIH): 58 mg/dL (ref 0–99)
LDL/HDL Ratio: 1.3 ratio (ref 0.0–3.2)
Triglycerides: 123 mg/dL (ref 0–149)
VLDL Cholesterol Cal: 22 mg/dL (ref 5–40)

## 2024-06-23 LAB — COMPREHENSIVE METABOLIC PANEL WITH GFR
ALT: 11 [IU]/L (ref 0–32)
AST: 15 [IU]/L (ref 0–40)
Albumin: 4.2 g/dL (ref 3.9–4.9)
Alkaline Phosphatase: 101 [IU]/L (ref 41–116)
BUN/Creatinine Ratio: 15 (ref 9–23)
BUN: 12 mg/dL (ref 6–24)
Bilirubin Total: <0.2 mg/dL (ref 0.0–1.2)
CO2: 22 mmol/L (ref 20–29)
Calcium: 9.3 mg/dL (ref 8.7–10.2)
Chloride: 107 mmol/L — ABNORMAL HIGH (ref 96–106)
Creatinine, Ser: 0.79 mg/dL (ref 0.57–1.00)
Globulin, Total: 2.5 g/dL (ref 1.5–4.5)
Glucose: 88 mg/dL (ref 70–99)
Potassium: 4.4 mmol/L (ref 3.5–5.2)
Sodium: 144 mmol/L (ref 134–144)
Total Protein: 6.7 g/dL (ref 6.0–8.5)
eGFR: 92 mL/min/{1.73_m2}

## 2024-06-23 LAB — HEMOGLOBIN A1C
Est. average glucose Bld gHb Est-mCnc: 120 mg/dL
Hgb A1c MFr Bld: 5.8 % — ABNORMAL HIGH (ref 4.8–5.6)

## 2024-06-23 LAB — TSH+T4F+T3FREE
Free T4: 0.87 ng/dL (ref 0.82–1.77)
T3, Free: 2.9 pg/mL (ref 2.0–4.4)
TSH: 2.7 u[IU]/mL (ref 0.450–4.500)

## 2024-06-23 LAB — INSULIN, RANDOM: INSULIN: 23 u[IU]/mL (ref 2.6–24.9)

## 2024-06-23 LAB — VITAMIN D 25 HYDROXY (VIT D DEFICIENCY, FRACTURES): Vit D, 25-Hydroxy: 34.6 ng/mL (ref 30.0–100.0)

## 2024-07-06 ENCOUNTER — Ambulatory Visit: Admitting: Bariatrics

## 2024-07-18 ENCOUNTER — Ambulatory Visit: Admitting: Pulmonary Disease
